# Patient Record
Sex: Female | Born: 1975 | Race: Black or African American | Hispanic: No | Smoking: Current some day smoker
Health system: Southern US, Community
[De-identification: ages and names within clinical notes are randomized; demographics above are authoritative.]

## PROBLEM LIST (undated history)

## (undated) DIAGNOSIS — E119 Type 2 diabetes mellitus without complications: Secondary | ICD-10-CM

## (undated) DIAGNOSIS — L089 Local infection of the skin and subcutaneous tissue, unspecified: Secondary | ICD-10-CM

## (undated) DIAGNOSIS — M86271 Subacute osteomyelitis, right ankle and foot: Secondary | ICD-10-CM

## (undated) DIAGNOSIS — Z8614 Personal history of Methicillin resistant Staphylococcus aureus infection: Secondary | ICD-10-CM

## (undated) DIAGNOSIS — Z794 Long term (current) use of insulin: Secondary | ICD-10-CM

## (undated) HISTORY — PX: IRRIGATION AND DEBRIDEMENT KNEE: SHX5185

## (undated) HISTORY — DX: Subacute osteomyelitis, right ankle and foot: M86.271

---
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
## 2018-10-07 ENCOUNTER — Other Ambulatory Visit: Payer: Self-pay

## 2018-10-07 ENCOUNTER — Encounter (HOSPITAL_COMMUNITY): Payer: Self-pay

## 2018-10-07 ENCOUNTER — Inpatient Hospital Stay (HOSPITAL_COMMUNITY): Payer: Medicaid - Out of State

## 2018-10-07 ENCOUNTER — Inpatient Hospital Stay (HOSPITAL_COMMUNITY)
Admission: AD | Admit: 2018-10-07 | Discharge: 2018-10-15 | DRG: 637 | Disposition: A | Discharge status: Home Health | Payer: Medicaid - Out of State | Source: Other Acute Inpatient Hospital | Attending: Family Medicine | Admitting: Family Medicine

## 2018-10-07 DIAGNOSIS — R7989 Other specified abnormal findings of blood chemistry: Secondary | ICD-10-CM

## 2018-10-07 DIAGNOSIS — Z86718 Personal history of other venous thrombosis and embolism: Secondary | ICD-10-CM | POA: Diagnosis not present

## 2018-10-07 DIAGNOSIS — I10 Essential (primary) hypertension: Secondary | ICD-10-CM | POA: Diagnosis present

## 2018-10-07 DIAGNOSIS — E876 Hypokalemia: Secondary | ICD-10-CM | POA: Diagnosis present

## 2018-10-07 DIAGNOSIS — E1169 Type 2 diabetes mellitus with other specified complication: Principal | ICD-10-CM | POA: Diagnosis present

## 2018-10-07 DIAGNOSIS — M79671 Pain in right foot: Secondary | ICD-10-CM | POA: Diagnosis present

## 2018-10-07 DIAGNOSIS — R7881 Bacteremia: Secondary | ICD-10-CM | POA: Diagnosis present

## 2018-10-07 DIAGNOSIS — E11610 Type 2 diabetes mellitus with diabetic neuropathic arthropathy: Secondary | ICD-10-CM | POA: Diagnosis present

## 2018-10-07 DIAGNOSIS — I82409 Acute embolism and thrombosis of unspecified deep veins of unspecified lower extremity: Secondary | ICD-10-CM

## 2018-10-07 DIAGNOSIS — S81801S Unspecified open wound, right lower leg, sequela: Secondary | ICD-10-CM | POA: Diagnosis not present

## 2018-10-07 DIAGNOSIS — T80211A Bloodstream infection due to central venous catheter, initial encounter: Secondary | ICD-10-CM | POA: Diagnosis not present

## 2018-10-07 DIAGNOSIS — L97509 Non-pressure chronic ulcer of other part of unspecified foot with unspecified severity: Secondary | ICD-10-CM | POA: Diagnosis present

## 2018-10-07 DIAGNOSIS — F17210 Nicotine dependence, cigarettes, uncomplicated: Secondary | ICD-10-CM | POA: Diagnosis present

## 2018-10-07 DIAGNOSIS — E11621 Type 2 diabetes mellitus with foot ulcer: Secondary | ICD-10-CM | POA: Diagnosis present

## 2018-10-07 DIAGNOSIS — E1142 Type 2 diabetes mellitus with diabetic polyneuropathy: Secondary | ICD-10-CM | POA: Diagnosis present

## 2018-10-07 DIAGNOSIS — I38 Endocarditis, valve unspecified: Secondary | ICD-10-CM | POA: Diagnosis present

## 2018-10-07 DIAGNOSIS — T80219A Unspecified infection due to central venous catheter, initial encounter: Secondary | ICD-10-CM | POA: Diagnosis not present

## 2018-10-07 DIAGNOSIS — I340 Nonrheumatic mitral (valve) insufficiency: Secondary | ICD-10-CM | POA: Diagnosis not present

## 2018-10-07 DIAGNOSIS — IMO0002 Reserved for concepts with insufficient information to code with codable children: Secondary | ICD-10-CM

## 2018-10-07 DIAGNOSIS — B9562 Methicillin resistant Staphylococcus aureus infection as the cause of diseases classified elsewhere: Secondary | ICD-10-CM | POA: Diagnosis present

## 2018-10-07 DIAGNOSIS — E43 Unspecified severe protein-calorie malnutrition: Secondary | ICD-10-CM | POA: Diagnosis present

## 2018-10-07 DIAGNOSIS — E1165 Type 2 diabetes mellitus with hyperglycemia: Secondary | ICD-10-CM

## 2018-10-07 DIAGNOSIS — X58XXXS Exposure to other specified factors, sequela: Secondary | ICD-10-CM | POA: Diagnosis not present

## 2018-10-07 DIAGNOSIS — R197 Diarrhea, unspecified: Secondary | ICD-10-CM | POA: Diagnosis not present

## 2018-10-07 DIAGNOSIS — L03115 Cellulitis of right lower limb: Secondary | ICD-10-CM | POA: Diagnosis present

## 2018-10-07 DIAGNOSIS — E118 Type 2 diabetes mellitus with unspecified complications: Secondary | ICD-10-CM

## 2018-10-07 DIAGNOSIS — Z95828 Presence of other vascular implants and grafts: Secondary | ICD-10-CM

## 2018-10-07 DIAGNOSIS — M869 Osteomyelitis, unspecified: Secondary | ICD-10-CM | POA: Diagnosis present

## 2018-10-07 DIAGNOSIS — R945 Abnormal results of liver function studies: Secondary | ICD-10-CM

## 2018-10-07 HISTORY — DX: Long term (current) use of insulin: Z79.4

## 2018-10-07 HISTORY — DX: Personal history of Methicillin resistant Staphylococcus aureus infection: Z86.14

## 2018-10-07 HISTORY — DX: Long term (current) use of insulin: E11.9

## 2018-10-07 LAB — COMPREHENSIVE METABOLIC PANEL
ALT: 44 U/L (ref 0–44)
AST: 55 U/L — ABNORMAL HIGH (ref 15–41)
Albumin: 2.2 g/dL — ABNORMAL LOW (ref 3.5–5.0)
Alkaline Phosphatase: 402 U/L — ABNORMAL HIGH (ref 38–126)
Anion gap: 8 (ref 5–15)
BUN: <5 mg/dL — ABNORMAL LOW (ref 6–20)
CO2: 23 mmol/L (ref 22–32)
Calcium: 8.5 mg/dL — ABNORMAL LOW (ref 8.9–10.3)
Chloride: 104 mmol/L (ref 98–111)
Creatinine, Ser: 0.7 mg/dL (ref 0.44–1.00)
GFR calc Af Amer: >60 mL/min (ref >60)
GFR calc non Af Amer: >60 mL/min (ref >60)
Glucose, Bld: 328 mg/dL — ABNORMAL HIGH (ref 70–99)
Potassium: 3.3 mmol/L — ABNORMAL LOW (ref 3.5–5.1)
Sodium: 135 mmol/L (ref 135–145)
Total Bilirubin: 0.8 mg/dL (ref 0.3–1.2)
Total Protein: 8.9 g/dL — ABNORMAL HIGH (ref 6.5–8.1)

## 2018-10-07 LAB — GLUCOSE, CAPILLARY
GLUCOSE-CAPILLARY: 273 mg/dL — AB (ref 70–99)
Glucose-Capillary: 169 mg/dL — ABNORMAL HIGH (ref 70–99)
Glucose-Capillary: 195 mg/dL — ABNORMAL HIGH (ref 70–99)

## 2018-10-07 LAB — HEMOGLOBIN A1C
Hgb A1c MFr Bld: 11 % — ABNORMAL HIGH (ref 4.8–5.6)
MEAN PLASMA GLUCOSE: 269 mg/dL

## 2018-10-07 LAB — PROTIME-INR
INR: 1 (ref 0.8–1.2)
Prothrombin Time: 12.9 seconds (ref 11.4–15.2)

## 2018-10-07 LAB — LIPID PANEL
Cholesterol: 185 mg/dL (ref 0–200)
HDL: 50 mg/dL (ref >40)
LDL Cholesterol: 122 mg/dL — ABNORMAL HIGH (ref 0–99)
Total CHOL/HDL Ratio: 3.7 RATIO
Triglycerides: 63 mg/dL (ref <150)
VLDL: 13 mg/dL (ref 0–40)

## 2018-10-07 LAB — MAGNESIUM: Magnesium: 1.9 mg/dL (ref 1.7–2.4)

## 2018-10-07 LAB — MRSA PCR SCREENING: MRSA by PCR: POSITIVE — AB

## 2018-10-07 LAB — PREGNANCY, URINE: Preg Test, Ur: NEGATIVE

## 2018-10-07 LAB — HIV ANTIBODY (ROUTINE TESTING W REFLEX): HIV SCREEN 4TH GENERATION: NONREACTIVE

## 2018-10-07 MED ORDER — ACETAMINOPHEN 650 MG RE SUPP: 650.0000 mg | Freq: Four times a day (QID) | RECTAL | Status: DC | PRN

## 2018-10-07 MED ORDER — POTASSIUM CHLORIDE 10 MEQ/100ML IV SOLN
10.0000 meq | INTRAVENOUS | Status: DC
  Filled 2018-10-07 (×3): qty 100

## 2018-10-07 MED ORDER — POTASSIUM CHLORIDE 10 MEQ/100ML IV SOLN
10.0000 meq | INTRAVENOUS | Status: AC
  Administered 2018-10-07 (×2): 10 meq via INTRAVENOUS
  Filled 2018-10-07 (×2): qty 100

## 2018-10-07 MED ORDER — PIPERACILLIN-TAZOBACTAM 3.375 G IVPB
3.3750 g | Freq: Three times a day (TID) | INTRAVENOUS | Status: DC
  Administered 2018-10-07 – 2018-10-08 (×4): 3.375 g via INTRAVENOUS
  Filled 2018-10-07 (×5): qty 50

## 2018-10-07 MED ORDER — BISACODYL 5 MG PO TBEC: 5.0000 mg | DELAYED_RELEASE_TABLET | Freq: Every day | ORAL | Status: DC | PRN

## 2018-10-07 MED ORDER — INSULIN ASPART 100 UNIT/ML ~~LOC~~ SOLN
0.0000 [IU] | SUBCUTANEOUS | Status: DC
  Administered 2018-10-07: 7 [IU] via SUBCUTANEOUS
  Administered 2018-10-07: 2 [IU] via SUBCUTANEOUS

## 2018-10-07 MED ORDER — RIVAROXABAN 20 MG PO TABS: 20.0000 mg | ORAL_TABLET | Freq: Every day | ORAL | Status: DC

## 2018-10-07 MED ORDER — INSULIN GLARGINE 100 UNIT/ML ~~LOC~~ SOLN
40.0000 [IU] | Freq: Two times a day (BID) | SUBCUTANEOUS | Status: DC
  Administered 2018-10-07 – 2018-10-15 (×16): 40 [IU] via SUBCUTANEOUS
  Filled 2018-10-07 (×19): qty 0.4

## 2018-10-07 MED ORDER — ZINC SULFATE 220 (50 ZN) MG PO CAPS
220.0000 mg | ORAL_CAPSULE | Freq: Every day | ORAL | Status: DC
  Administered 2018-10-07 – 2018-10-15 (×9): 220 mg via ORAL
  Filled 2018-10-07 (×9): qty 1

## 2018-10-07 MED ORDER — ZOLPIDEM TARTRATE 5 MG PO TABS
5.0000 mg | ORAL_TABLET | Freq: Every evening | ORAL | Status: DC | PRN
  Administered 2018-10-08: 5 mg via ORAL
  Filled 2018-10-07: qty 1

## 2018-10-07 MED ORDER — TRAMADOL HCL 50 MG PO TABS
50.0000 mg | ORAL_TABLET | Freq: Four times a day (QID) | ORAL | Status: DC | PRN
  Filled 2018-10-07: qty 1

## 2018-10-07 MED ORDER — VANCOMYCIN HCL 1000 MG IV SOLR
1000.0000 mg | Freq: Three times a day (TID) | INTRAVENOUS | Status: DC
  Administered 2018-10-07 – 2018-10-08 (×3): 1000 mg via INTRAVENOUS
  Filled 2018-10-07 (×4): qty 1000

## 2018-10-07 MED ORDER — ACETAMINOPHEN 325 MG PO TABS
650.0000 mg | ORAL_TABLET | Freq: Four times a day (QID) | ORAL | Status: DC | PRN
  Administered 2018-10-07: 650 mg via ORAL
  Filled 2018-10-07 (×2): qty 2

## 2018-10-07 MED ORDER — DIPHENHYDRAMINE HCL 25 MG PO CAPS
25.0000 mg | ORAL_CAPSULE | Freq: Four times a day (QID) | ORAL | Status: AC | PRN
  Administered 2018-10-08 – 2018-10-10 (×2): 25 mg via ORAL
  Filled 2018-10-07 (×5): qty 1

## 2018-10-07 MED ORDER — SODIUM CHLORIDE 0.9 % IV SOLN
INTRAVENOUS | Status: DC
  Administered 2018-10-07: 06:00:00 via INTRAVENOUS

## 2018-10-07 MED ORDER — BASAGLAR KWIKPEN 100 UNIT/ML ~~LOC~~ SOPN: 50.0000 [IU] | PEN_INJECTOR | Freq: Two times a day (BID) | SUBCUTANEOUS | Status: DC

## 2018-10-07 MED ORDER — PRO-STAT SUGAR FREE PO LIQD
30.0000 mL | Freq: Two times a day (BID) | ORAL | Status: DC
  Administered 2018-10-07 – 2018-10-15 (×17): 30 mL via ORAL
  Filled 2018-10-07 (×17): qty 30

## 2018-10-07 MED ORDER — CHLORHEXIDINE GLUCONATE CLOTH 2 % EX PADS
6.0000 | MEDICATED_PAD | Freq: Every day | CUTANEOUS | Status: AC
  Administered 2018-10-09 – 2018-10-12 (×3): 6 via TOPICAL

## 2018-10-07 MED ORDER — MUPIROCIN 2 % EX OINT
1.0000 "application " | TOPICAL_OINTMENT | Freq: Two times a day (BID) | CUTANEOUS | Status: AC
  Administered 2018-10-07 – 2018-10-12 (×10): 1 via NASAL
  Filled 2018-10-07 (×2): qty 22

## 2018-10-07 MED ORDER — INSULIN ASPART 100 UNIT/ML ~~LOC~~ SOLN
0.0000 [IU] | Freq: Every day | SUBCUTANEOUS | Status: DC
  Administered 2018-10-08 – 2018-10-10 (×2): 3 [IU] via SUBCUTANEOUS
  Administered 2018-10-11 – 2018-10-13 (×3): 2 [IU] via SUBCUTANEOUS

## 2018-10-07 MED ORDER — SENNOSIDES-DOCUSATE SODIUM 8.6-50 MG PO TABS: 1.0000 | ORAL_TABLET | Freq: Every evening | ORAL | Status: DC | PRN

## 2018-10-07 MED ORDER — POTASSIUM CHLORIDE 10 MEQ/100ML IV SOLN
10.0000 meq | INTRAVENOUS | Status: AC
  Administered 2018-10-07: 10 meq via INTRAVENOUS
  Filled 2018-10-07: qty 100

## 2018-10-07 MED ORDER — VANCOMYCIN HCL 10 G IV SOLR
1500.0000 mg | Freq: Once | INTRAVENOUS | Status: AC
  Administered 2018-10-07: 1500 mg via INTRAVENOUS
  Filled 2018-10-07: qty 1500

## 2018-10-07 MED ORDER — HEPARIN SODIUM (PORCINE) 5000 UNIT/ML IJ SOLN
5000.0000 [IU] | Freq: Three times a day (TID) | INTRAMUSCULAR | Status: DC
  Administered 2018-10-07 – 2018-10-15 (×25): 5000 [IU] via SUBCUTANEOUS
  Filled 2018-10-07 (×22): qty 1

## 2018-10-07 MED ORDER — INSULIN ASPART 100 UNIT/ML ~~LOC~~ SOLN
0.0000 [IU] | Freq: Three times a day (TID) | SUBCUTANEOUS | Status: DC
  Administered 2018-10-07: 2 [IU] via SUBCUTANEOUS
  Administered 2018-10-07: 5 [IU] via SUBCUTANEOUS
  Administered 2018-10-08: 3 [IU] via SUBCUTANEOUS
  Administered 2018-10-08: 2 [IU] via SUBCUTANEOUS
  Administered 2018-10-08: 3 [IU] via SUBCUTANEOUS
  Administered 2018-10-09 – 2018-10-10 (×3): 2 [IU] via SUBCUTANEOUS
  Administered 2018-10-11: 3 [IU] via SUBCUTANEOUS
  Administered 2018-10-11: 09:00:00 1 [IU] via SUBCUTANEOUS
  Administered 2018-10-11: 3 [IU] via SUBCUTANEOUS
  Administered 2018-10-12: 2 [IU] via SUBCUTANEOUS
  Administered 2018-10-12: 4 [IU] via SUBCUTANEOUS
  Administered 2018-10-13: 5 [IU] via SUBCUTANEOUS
  Administered 2018-10-13: 3 [IU] via SUBCUTANEOUS
  Administered 2018-10-13: 7 [IU] via SUBCUTANEOUS
  Administered 2018-10-14 – 2018-10-15 (×3): 3 [IU] via SUBCUTANEOUS
  Administered 2018-10-15: 1 [IU] via SUBCUTANEOUS

## 2018-10-07 MED ORDER — KETOROLAC TROMETHAMINE 30 MG/ML IJ SOLN: 30.0000 mg | Freq: Four times a day (QID) | INTRAMUSCULAR | Status: AC | PRN

## 2018-10-07 NOTE — Progress Notes (Signed)
Orthopedic Tech Progress Note Patient Details:  Yolanda [name] [birth year] [id number]  Ortho Devices Type of Ortho Device: CAM walker Ortho Device/Splint Location: LRE Ortho Device/Splint Interventions: Adjustment, Application, Ordered   Post Interventions Patient Tolerated: Well Instructions Provided: Care of device, Adjustment of device   Donald Pore 10/07/2018, 10:39 AM

## 2018-10-07 NOTE — H&P (Addendum)
[facility] History and Physical  Disney Goguen OIB:[id number] DOB: [birth year] DOA: 10/07/2018  Referring physician:  PCP: No primary care provider on file.   Chief Complaint: Diabetic foot ulcer with osteomyelitis  HPI: Yolanda [name] is a 43 y.o.  BF PMHx Uncontrolled Diabetes type 2 with complication, diabetic foot ulcer ,OM and Celllulitis with MRSA Bacteremia as well , 4 weeks ago, on Daptomycin . Was followed by surgery at [facility] who contacted Dr Lajoyce Corners from orthopedic surgery who agreed to see patient in consult . Pt accepted to Medsurg at [facility] . Pt had an MRI at [facility] which showed OM .     Review of Systems:  Constitutional:  No weight loss, night sweats, Fevers, chills, fatigue.  HEENT:  No headaches, Difficulty swallowing,Tooth/dental problems,Sore throat,  No sneezing, itching, ear ache, nasal congestion, post nasal drip,  Cardio-vascular:  No chest pain, Orthopnea, PND, swelling in lower extremities, anasarca, dizziness, palpitations  GI:  No heartburn, indigestion, abdominal pain, nausea, vomiting, diarrhea, change in bowel habits, loss of appetite  Resp:  No shortness of breath with exertion or at rest. No excess mucus, no productive cough, No non-productive cough, No coughing up of blood.No change in color of mucus.No wheezing.No chest wall deformity  Skin:  RIGHT lower extremity ulcer/wound wrapped GU:  no dysuria, change in color of urine, no urgency or frequency. No flank pain.  Musculoskeletal:  Positive RIGHT lower extremity pain and swelling.  No decreased range of motion. No back pain.  Psych:  No change in mood or affect. No depression or anxiety. No memory loss.   Social History:  has no history on file for tobacco, alcohol, and drug.  No Known Allergies  No family history on file.  Negative family history HTN, HLD, cancer    Prior to Admission medications   Not on File     Consultants:  Dr. Lajoyce Corners orthopedic  surgery    Procedures/Significant Events:  None    I have personally reviewed and interpreted all radiology studies and my findings are as above.   VENTILATOR SETTINGS:    Cultures 3/29 blood pending    Antimicrobials: 3/29 Zosyn per pharmacy 3/29 vancomycin per pharmacy    Devices    LINES / TUBES:   Left PICC Line Placed outside Facility>>>   Continuous Infusions:  Physical Exam: Vitals:   10/07/18 0141  BP: (!) 148/96  Pulse: (!) 105  Resp: 16  Temp: 98.3 F (36.8 C)  TempSrc: Oral  SpO2: 98%    Wt Readings from Last 3 Encounters:  No data found for Wt    General: A/O x4, no acute respiratory distress Eyes: negative scleral hemorrhage, negative anisocoria, negative icterus ENT: Negative Runny nose, negative gingival bleeding, Neck:  Negative scars, masses, torticollis, lymphadenopathy, JVD Lungs: Clear to auscultation bilaterally without wheezes or crackles Cardiovascular: Regular rate and rhythm without murmur gallop or rub normal S1 and S2 Abdomen: negative abdominal pain, nondistended, positive soft, bowel sounds, no rebound, no ascites, no appreciable mass Extremities: No significant cyanosis, clubbing, or edema bilateral lower extremities Skin: Right lower extremity ulcer covered and clean did not take down dressing Psychiatric:  Negative depression, negative anxiety, negative fatigue, negative mania  Central nervous system:  Cranial nerves II through XII intact, tongue/uvula midline, all extremities muscle strength 5/5, sensation intact throughout, negative dysarthria, negative expressive aphasia, negative receptive aphasia.        Labs on Admission:  Basic Metabolic Panel: No results for input(s): NA, K, CL, CO2,  GLUCOSE, BUN, CREATININE, CALCIUM, MG, PHOS in the last 168 hours. Liver Function Tests: No results for input(s): AST, ALT, ALKPHOS, BILITOT, PROT, ALBUMIN in the last 168 hours. No results for input(s): LIPASE, AMYLASE in the  last 168 hours. No results for input(s): AMMONIA in the last 168 hours. CBC: No results for input(s): WBC, NEUTROABS, HGB, HCT, MCV, PLT in the last 168 hours. Cardiac Enzymes: No results for input(s): CKTOTAL, CKMB, CKMBINDEX, TROPONINI in the last 168 hours.  BNP (last 3 results) No results for input(s): BNP in the last 8760 hours.  ProBNP (last 3 results) No results for input(s): PROBNP in the last 8760 hours.  CBG: No results for input(s): GLUCAP in the last 168 hours.  Radiological Exams on Admission: No results found.  EKG: Independently reviewed. pending  Assessment/Plan Active Problems:   Osteomyelitis of ankle and foot (HCC)   Diabetes mellitus type 2, uncontrolled, with complications (HCC)   MRSA bacteremia   Osteomyelitis RIGHT foot/ankle - Antibiotics per pharmacy - Dr. Lajoyce Corners excepted patient in consult.  Will await recommendations when patient is seen in the a.m.  MRSA bacteremia - Current antibiotics should cover - Blood cultures pending  Diabetes type 2 uncontrolled with complication - Hemoglobin A1c pending -Lipid panel pending - Sensitive SSI     Code Status: Full (DVT Prophylaxis: Held pending surgery Family Communication: None Disposition Plan: Per surgery   Data Reviewed: Care during the described time interval was provided by me .  I have reviewed this patient's available data, including medical history, events of note, physical examination, and all test results as part of my evaluation.   Time spent: 60 min  Isa Kohlenberg, Roselind Messier [facility] Pager [phone]

## 2018-10-07 NOTE — Progress Notes (Signed)
Pharmacy Antibiotic Note  Yolanda [name] is a 43 y.o. female admitted on 10/07/2018 with worsening right ankle cellulitis/osteomyelitis.  SCr 0.78 at [facility].  On Daptomycin 600 mg q24h as outpatient and at [facility].   Pharmacy has been consulted for Vancomycin and Zosyn  dosing.    Plan: Vancomycin 1500 mg IV now, then 1 g IV  q8h Zosyn 3.375 g IV q8h   Height: 5\' 6"  (167.6 cm) Weight: 170 lb (77.1 kg) IBW/kg (Calculated) : 59.3  Temp (24hrs), Avg:98.3 F (36.8 C), Min:98.3 F (36.8 C), Max:98.3 F (36.8 C)  No results for input(s): WBC, CREATININE, LATICACIDVEN, VANCOTROUGH, VANCOPEAK, VANCORANDOM, GENTTROUGH, GENTPEAK, GENTRANDOM, TOBRATROUGH, TOBRAPEAK, TOBRARND, AMIKACINPEAK, AMIKACINTROU, AMIKACIN in the last 168 hours.  CrCl cannot be calculated (No successful lab value found.).    No Known Allergies   Eddie Candle 10/07/2018 3:49 AM

## 2018-10-07 NOTE — Plan of Care (Signed)
  Problem: Education: Goal: Ability to describe self-care measures that may prevent or decrease complications (Diabetes Survival Skills Education) will improve Outcome: Progressing Goal: Individualized Educational Video(s) Outcome: Progressing   

## 2018-10-07 NOTE — Consult Note (Signed)
ORTHOPAEDIC CONSULTATION  REQUESTING PHYSICIAN: Cathren Harsh, MD  Chief Complaint: Mechanical pain right foot possible osteomyelitis.  HPI: Yolanda [name] is a 43 y.o. female with uncontrolled diabetic insensate neuropathy who presents with a chronic wound over the lateral aspect of the right leg.  Patient has been undergoing wound care and treatment for several months.  Patient states the wound was initially culture positive for MRSA.  Patient has been undergoing wound VAC treatment changes 3 times a week at the hospital in [place].  She is currently on IV antibiotics with a PICC line.  Past Medical History:  Diagnosis Date  . MRSA infection within last 3 months   . Type 2 diabetes mellitus treated with insulin [facility])    Past Surgical History:  Procedure Laterality Date  . IRRIGATION AND DEBRIDEMENT KNEE Left    Social History   Socioeconomic History  . Marital status: Unknown    Spouse name: Not on file  . Number of children: Not on file  . Years of education: Not on file  . Highest education level: Not on file  Occupational History  . Not on file  Social Needs  . Financial resource strain: Not on file  . Food insecurity:    Worry: Not on file    Inability: Not on file  . Transportation needs:    Medical: Not on file    Non-medical: Not on file  Tobacco Use  . Smoking status: Current Some Day Smoker    Years: 3.00    Types: Cigarettes  . Smokeless tobacco: Never Used  Substance and Sexual Activity  . Alcohol use: Not Currently  . Drug use: Never  . Sexual activity: Not on file  Lifestyle  . Physical activity:    Days per week: Not on file    Minutes per session: Not on file  . Stress: Not on file  Relationships  . Social connections:    Talks on phone: Not on file    Gets together: Not on file    Attends religious service: Not on file    Active member of club or organization: Not on file    Attends meetings of clubs or organizations: Not on file   Relationship status: Not on file  Other Topics Concern  . Not on file  Social History Narrative  . Not on file   No family history on file. - negative except otherwise stated in the family history section No Known Allergies Prior to Admission medications   Not on File   Dg Chest 1 View  Result Date: 10/07/2018 CLINICAL DATA:  MRSA bacteremia, PICC line placement EXAM: CHEST  1 VIEW COMPARISON:  None. FINDINGS: Lungs are clear.  No pleural effusion or pneumothorax. The heart is normal in size. Left arm PICC terminates in the upper SVC, 4.5 cm above the cavoatrial junction. IMPRESSION: Left arm PICC terminates in the upper SVC, 4.5 cm above the cavoatrial junction. Electronically Signed   By: Charline Bills M.D.   On: 10/07/2018 06:05   Dg Humerus Left  Result Date: 10/07/2018 CLINICAL DATA:  MRSA bacteremia, status post PICC line placement EXAM: LEFT HUMERUS - 2+ VIEW COMPARISON:  None. FINDINGS: No fracture or dislocation is seen. The joint spaces are preserved. The visualized soft tissues are unremarkable. Visualized left lung is clear. Left arm PICC courses through the axillary region into the chest. IMPRESSION: Negative. Left arm PICC courses through the axillary region into the chest. Electronically Signed   By: Lurlean Horns  Rito Ehrlich M.D.   On: 10/07/2018 06:04   - pertinent xrays, CT, MRI studies were reviewed and independently interpreted  Positive ROS: All other systems have been reviewed and were otherwise negative with the exception of those mentioned in the HPI and as above.  Physical Exam: General: Alert, no acute distress Psychiatric: Patient is competent for consent with normal mood and affect Lymphatic: No axillary or cervical lymphadenopathy Cardiovascular: No pedal edema Respiratory: No cyanosis, no use of accessory musculature GI: No organomegaly, abdomen is soft and non-tender    Images:  @ENCIMAGES @  Labs:  Lab Results  Component Value Date   HGBA1C 11.0  (H) 10/07/2018    Lab Results  Component Value Date   ALBUMIN 2.2 (L) 10/07/2018    Neurologic: Patient does not have protective sensation bilateral lower extremities.   MUSCULOSKELETAL:   Skin: Examination patient does have swelling of the right foot.  She has a strong dorsalis pedis and posterior tibial pulse no arterial insufficiency.  There are no foot ulcers there is no cellulitis.  The wound VAC was removed and patient has a large wound over the fibula which is 3 cm at the widest diameter and about 20 cm in length.  The wound has excellent granulation tissue there is no purulence no exposed bone or tendon no cellulitis.  Review of the MRI scan on the disc shows no evidence of osteomyelitis of the fibula beneath the wound in the area that is visible on the MRI scan.  Examination of the hindfoot and the MRI scan shows edema in the talus calcaneus navicular cuboid and cuneiforms.  There is no signs of an abscess.  There is a small ankle effusion.  Radiographs show destructive changes of the hindfoot.  This all has the appearance of Charcot arthropathy.  Review of patient's labs she has a hemoglobin A1c of 11 and an albumin of 2.2 consistent with uncontrolled diabetes and severe protein caloric malnutrition.  Assessment: Assessment: Uncontrolled type 2 diabetes with severe protein caloric malnutrition with a Charcot collapse of the right hindfoot with a chronic wound over the fibula with excellent granulation tissue right lower extremity  Plan: Plan: 4 to 6 weeks of IV antibiotics should be sufficient to treat her MRSA bacteremia.  Patient does not need surgical intervention for the Charcot arthropathy this will take 6 months to completely heal.  The wound VAC has provided an excellent granulation bed and patient can be transitioned to my medical compression stockings.  I will bring these by on Monday.  I can follow-up as an outpatient if patient can travel.  Thank you for the consult  and the opportunity to see Ms. Yolanda Bookbinder, MD [facility] [phone] 9:41 AM

## 2018-10-07 NOTE — Progress Notes (Signed)
RLE venous duplex       has been completed. Preliminary results can be found under CV proc through chart review. Amaka Gluth, BS, RDMS, RVT   

## 2018-10-07 NOTE — Progress Notes (Addendum)
[facility]                                                                              Patient Demographics  Yolanda [name], is a 43 y.o. female, DOB - 1976/07/06, NWG:[id number]  Admit date - 10/07/2018   Admitting Physician Drema Dallas, MD  Outpatient Primary MD for the patient is No primary care provider on file.  Outpatient specialists:   LOS - 0  days   Medical records reviewed and are as summarized below:    No chief complaint on file.      Brief summary   43 year old female with uncontrolled diabetes mellitus type 2, diabetic foot ulcer, osteomyelitis and cellulitis with MRSA bacteremia, 4 weeks ongoing on daptomycin.  Patient was followed by surgery at [facility] who contacted Dr. Lajoyce Corners from orthopedic surgery for further management.  Patient was accepted to [facility].  Patient had an MRI at [facility] which showed osteomyelitis. Patient has a left arm PICC line placed outside facility  Assessment & Plan    Principal Problem:   Osteomyelitis of right ankle and foot (HCC) -MRI of the RLE at outside facility showed osteomyelitis, unable to find the MRI report in the care everywhere.  Patient has wound VAC on the right lower extremity.  Patient was transferred to [facility] for further management after consulting orthopedics, Dr. Lajoyce Corners -Discussed with Dr. Lajoyce Corners, who reviewed radiographs from [facility].  No need for surgery at this time for Charcot's arthropathy, will take 6 months to heal, will place medical compression stockings in a.m. -She had been receiving wound VAC treatment changes 3 times a week in [place].   Active Problems: MRSA bacteremia -Repeat blood cultures -Patient was on daptomycin at the outside facility, PICC line in left arm -Appreciate pharmacy for looking into PTA meds and records, patient started daptomycin on 09/14/2018 supposed to finish on 10/19/2018, 6 weeks - will discharge on daptomycin to complete on  10/19/2018    Diabetes mellitus type 2, uncontrolled, with complications (HCC) -Patient is on Lantus 50 units twice daily, pt states she has had hypoglycemia issues  -Placed on sliding scale insulin 3 times daily AC, decrease Lantus to 40 units twice a day, will adjust further according to CBG readings -Hemoglobin A1c 11.0  Hypokalemia Replaced  Right lower extremity DVT, diagnosed in 07/2018 -Patient was on Xarelto, completed starter pack however Xarelto was held due to bleeding issues at [facility] with debridement. -Recheck lower right lower extremity Doppler today, if still has DVT, will restart Xarelto and assess for any bleeding issues   Severe protein calorie malnutrition -Albumin 2.2 -Placed on pro-stat, zinc daily   Code Status: Full code DVT Prophylaxis: For now continue heparin subcu Family Communication: Discussed in detail with the patient, all imaging results, lab results explained to the patient    Disposition Plan: Per patient she was at [facility], however before that she was at home and had been receiving IV antibiotics via PICC line Await PT evaluation  Time Spent in minutes 35 minutes  Procedures:  None  Consultants:   Orthopedics  Antimicrobials:   Anti-infectives (From admission, onward)  Start     Dose/Rate Route Frequency Ordered Stop   10/07/18 1400  vancomycin (VANCOCIN) 1,000 mg in sodium chloride 0.9 % 250 mL IVPB     1,000 mg 250 mL/hr over 60 Minutes Intravenous Every 8 hours 10/07/18 0351     10/07/18 0430  vancomycin (VANCOCIN) 1,500 mg in sodium chloride 0.9 % 500 mL IVPB     1,500 mg 250 mL/hr over 120 Minutes Intravenous  Once 10/07/18 0351 10/07/18 0835   10/07/18 0430  piperacillin-tazobactam (ZOSYN) IVPB 3.375 g     3.375 g 12.5 mL/hr over 240 Minutes Intravenous Every 8 hours 10/07/18 0351           Medications  Scheduled Meds: . insulin aspart  0-9 Units Subcutaneous Q4H   Continuous Infusions: .  sodium chloride 75 mL/hr at 10/07/18 0629  . piperacillin-tazobactam (ZOSYN)  IV 3.375 g (10/07/18 0639)  . potassium chloride    . vancomycin     PRN Meds:.acetaminophen **OR** acetaminophen, bisacodyl, ketorolac, senna-docusate, traMADol, zolpidem      Subjective:   Yolanda [name] was seen and examined today.  Pain controlled.  No fevers or chills.  Patient denies dizziness, chest pain, shortness of breath, abdominal pain, N/V/D/C, new weakness, numbess, tingling. No acute events overnight.    Objective:   Vitals:   10/07/18 0141 10/07/18 0433  BP: (!) 148/96 (!) 143/99  Pulse: (!) 105 (!) 106  Resp: 16 16  Temp: 98.3 F (36.8 C) 98.6 F (37 C)  TempSrc: Oral Oral  SpO2: 98% 96%  Weight: 77.1 kg   Height: 5\' 6"  (1.676 m)    No intake or output data in the 24 hours ending 10/07/18 0856   Wt Readings from Last 3 Encounters:  10/07/18 77.1 kg     Exam  General: Alert and oriented x 3, NAD  Eyes:   HEENT:  Atraumatic, normocephalic  Cardiovascular: S1 S2 auscultated, Regular rate and rhythm.  Respiratory: Clear to auscultation bilaterally, no wheezing, rales or rhonchi  Gastrointestinal: Soft, nontender, nondistended, + bowel sounds  Ext: no pedal edema bilaterally, right lower extremity dressing intact with wound VAC  Neuro: No new deficits  Musculoskeletal: No digital cyanosis, clubbing  Skin: Right lower extremity dressing intact with wound VAC  Psych: Normal affect and demeanor, alert and oriented x3    Data Reviewed:  I have personally reviewed following labs and imaging studies  Micro Results Recent Results (from the past 240 hour(s))  MRSA PCR Screening     Status: Abnormal   Collection Time: 10/07/18  5:34 AM  Result Value Ref Range Status   MRSA by PCR POSITIVE (A) NEGATIVE Final    Comment:        The GeneXpert MRSA Assay (FDA approved for NASAL specimens only), is one component of a comprehensive MRSA colonization surveillance  program. It is not intended to diagnose MRSA infection nor to guide or monitor treatment for MRSA infections. RESULT CALLED TO, READ BACK BY AND VERIFIED WITH: B. DAVIS RN, AT [phone] 10/07/18 BY Renato Shin Performed at [facility], [address] [place], [place] [address]     Radiology Reports Dg Chest 1 View  Result Date: 10/07/2018 CLINICAL DATA:  MRSA bacteremia, PICC line placement EXAM: CHEST  1 VIEW COMPARISON:  None. FINDINGS: Lungs are clear.  No pleural effusion or pneumothorax. The heart is normal in size. Left arm PICC terminates in the upper SVC, 4.5 cm above the cavoatrial junction. IMPRESSION: Left arm PICC terminates  in the upper SVC, 4.5 cm above the cavoatrial junction. Electronically Signed   By: Charline Bills M.D.   On: 10/07/2018 06:05   Dg Humerus Left  Result Date: 10/07/2018 CLINICAL DATA:  MRSA bacteremia, status post PICC line placement EXAM: LEFT HUMERUS - 2+ VIEW COMPARISON:  None. FINDINGS: No fracture or dislocation is seen. The joint spaces are preserved. The visualized soft tissues are unremarkable. Visualized left lung is clear. Left arm PICC courses through the axillary region into the chest. IMPRESSION: Negative. Left arm PICC courses through the axillary region into the chest. Electronically Signed   By: Charline Bills M.D.   On: 10/07/2018 06:04    Lab Data:  CBC: No results for input(s): WBC, NEUTROABS, HGB, HCT, MCV, PLT in the last 168 hours. Basic Metabolic Panel: Recent Labs  Lab 10/07/18 0329  NA 135  K 3.3*  CL 104  CO2 23  GLUCOSE 328*  BUN <5*  CREATININE 0.70  CALCIUM 8.5*  MG 1.9   GFR: Estimated Creatinine Clearance: 96 mL/min (by C-G formula based on SCr of 0.7 mg/dL). Liver Function Tests: Recent Labs  Lab 10/07/18 0329  AST 55*  ALT 44  ALKPHOS 402*  BILITOT 0.8  PROT 8.9*  ALBUMIN 2.2*   No results for input(s): LIPASE, AMYLASE in the last 168 hours. No results for input(s): AMMONIA in the last 168  hours. Coagulation Profile: Recent Labs  Lab 10/07/18 0329  INR 1.0   Cardiac Enzymes: No results for input(s): CKTOTAL, CKMB, CKMBINDEX, TROPONINI in the last 168 hours. BNP (last 3 results) No results for input(s): PROBNP in the last 8760 hours. HbA1C: Recent Labs    10/07/18 0329  HGBA1C 11.0*   CBG: No results for input(s): GLUCAP in the last 168 hours. Lipid Profile: Recent Labs    10/07/18 0329  CHOL 185  HDL 50  LDLCALC 122*  TRIG 63  CHOLHDL 3.7   Thyroid Function Tests: No results for input(s): TSH, T4TOTAL, FREET4, T3FREE, THYROIDAB in the last 72 hours. Anemia Panel: No results for input(s): VITAMINB12, FOLATE, FERRITIN, TIBC, IRON, RETICCTPCT in the last 72 hours. Urine analysis: No results found for: COLORURINE, APPEARANCEUR, LABSPEC, PHURINE, GLUCOSEU, HGBUR, BILIRUBINUR, KETONESUR, PROTEINUR, UROBILINOGEN, NITRITE, LEUKOCYTESUR   Malyk Girouard M.D. [facility] 10/07/2018, 8:56 AM  Pager: [phone] Between 7am to 7pm - call Pager - [phone]  After 7pm go to [email or web address] - password [email or web address]  Call night coverage person covering after 7pm

## 2018-10-08 ENCOUNTER — Inpatient Hospital Stay (HOSPITAL_COMMUNITY): Payer: Medicaid - Out of State

## 2018-10-08 DIAGNOSIS — I340 Nonrheumatic mitral (valve) insufficiency: Secondary | ICD-10-CM

## 2018-10-08 DIAGNOSIS — M869 Osteomyelitis, unspecified: Secondary | ICD-10-CM

## 2018-10-08 DIAGNOSIS — F17210 Nicotine dependence, cigarettes, uncomplicated: Secondary | ICD-10-CM | POA: Diagnosis present

## 2018-10-08 DIAGNOSIS — A4902 Methicillin resistant Staphylococcus aureus infection, unspecified site: Secondary | ICD-10-CM

## 2018-10-08 DIAGNOSIS — Z95828 Presence of other vascular implants and grafts: Secondary | ICD-10-CM

## 2018-10-08 LAB — BLOOD CULTURE ID PANEL (REFLEXED)
Acinetobacter baumannii: NOT DETECTED
Candida albicans: NOT DETECTED
Candida glabrata: NOT DETECTED
Candida krusei: NOT DETECTED
Candida parapsilosis: NOT DETECTED
Candida tropicalis: NOT DETECTED
ENTEROBACTER CLOACAE COMPLEX: NOT DETECTED
Enterobacteriaceae species: NOT DETECTED
Enterococcus species: NOT DETECTED
Escherichia coli: NOT DETECTED
Haemophilus influenzae: NOT DETECTED
KLEBSIELLA OXYTOCA: NOT DETECTED
Klebsiella pneumoniae: NOT DETECTED
Listeria monocytogenes: NOT DETECTED
Methicillin resistance: DETECTED — AB
Neisseria meningitidis: NOT DETECTED
Proteus species: NOT DETECTED
Pseudomonas aeruginosa: NOT DETECTED
STREPTOCOCCUS AGALACTIAE: NOT DETECTED
STREPTOCOCCUS PYOGENES: NOT DETECTED
Serratia marcescens: NOT DETECTED
Staphylococcus aureus (BCID): DETECTED — AB
Staphylococcus species: DETECTED — AB
Streptococcus pneumoniae: NOT DETECTED
Streptococcus species: NOT DETECTED

## 2018-10-08 LAB — ECHOCARDIOGRAM LIMITED
Height: 66 in
Weight: 2720 oz

## 2018-10-08 LAB — BASIC METABOLIC PANEL
Anion gap: 7 (ref 5–15)
BUN: 5 mg/dL — AB (ref 6–20)
CO2: 25 mmol/L (ref 22–32)
Calcium: 8.6 mg/dL — ABNORMAL LOW (ref 8.9–10.3)
Chloride: 105 mmol/L (ref 98–111)
Creatinine, Ser: 0.63 mg/dL (ref 0.44–1.00)
GFR calc Af Amer: >60 mL/min (ref >60)
GFR calc non Af Amer: >60 mL/min (ref >60)
Glucose, Bld: 174 mg/dL — ABNORMAL HIGH (ref 70–99)
Potassium: 3.4 mmol/L — ABNORMAL LOW (ref 3.5–5.1)
Sodium: 137 mmol/L (ref 135–145)

## 2018-10-08 LAB — CBC
HCT: 31.7 % — ABNORMAL LOW (ref 36.0–46.0)
Hemoglobin: 10.1 g/dL — ABNORMAL LOW (ref 12.0–15.0)
MCH: 30.2 pg (ref 26.0–34.0)
MCHC: 31.9 g/dL (ref 30.0–36.0)
MCV: 94.9 fL (ref 80.0–100.0)
Platelets: 205 10*3/uL (ref 150–400)
RBC: 3.34 MIL/uL — ABNORMAL LOW (ref 3.87–5.11)
RDW: 12.7 % (ref 11.5–15.5)
WBC: 5.5 10*3/uL (ref 4.0–10.5)
nRBC: 0 % (ref 0.0–0.2)

## 2018-10-08 LAB — GLUCOSE, CAPILLARY
Glucose-Capillary: 178 mg/dL — ABNORMAL HIGH (ref 70–99)
Glucose-Capillary: 154 mg/dL — ABNORMAL HIGH (ref 70–99)
Glucose-Capillary: 215 mg/dL — ABNORMAL HIGH (ref 70–99)
Glucose-Capillary: 250 mg/dL — ABNORMAL HIGH (ref 70–99)
Glucose-Capillary: 296 mg/dL — ABNORMAL HIGH (ref 70–99)

## 2018-10-08 LAB — URINE CULTURE: Culture: <10000 — AB

## 2018-10-08 MED ORDER — INSULIN ASPART 100 UNIT/ML ~~LOC~~ SOLN
3.0000 [IU] | Freq: Three times a day (TID) | SUBCUTANEOUS | Status: DC
  Administered 2018-10-08 – 2018-10-11 (×8): 3 [IU] via SUBCUTANEOUS

## 2018-10-08 MED ORDER — TRAMADOL HCL 50 MG PO TABS: 50.0000 mg | ORAL_TABLET | Freq: Three times a day (TID) | ORAL | 0 refills | Status: DC | PRN

## 2018-10-08 MED ORDER — VITAMIN C 500 MG PO TABS: 500.0000 mg | ORAL_TABLET | Freq: Every day | ORAL | 2 refills | Status: AC

## 2018-10-08 MED ORDER — DAPTOMYCIN IV (FOR PTA / DISCHARGE USE ONLY): 600.0000 mg | INTRAVENOUS | 0 refills | Status: DC

## 2018-10-08 MED ORDER — METOPROLOL TARTRATE 12.5 MG HALF TABLET
12.5000 mg | ORAL_TABLET | Freq: Two times a day (BID) | ORAL | Status: DC
  Administered 2018-10-08 – 2018-10-09 (×3): 12.5 mg via ORAL
  Filled 2018-10-08 (×3): qty 1

## 2018-10-08 MED ORDER — LABETALOL HCL 5 MG/ML IV SOLN
10.0000 mg | INTRAVENOUS | Status: DC | PRN
  Administered 2018-10-08: 10 mg via INTRAVENOUS
  Filled 2018-10-08 (×2): qty 4

## 2018-10-08 MED ORDER — VANCOMYCIN HCL 1000 MG IV SOLR
1000.0000 mg | Freq: Two times a day (BID) | INTRAVENOUS | Status: DC
  Administered 2018-10-08 – 2018-10-09 (×2): 1000 mg via INTRAVENOUS
  Filled 2018-10-08 (×2): qty 1000

## 2018-10-08 MED ORDER — LIVING WELL WITH DIABETES BOOK
Freq: Once | Status: AC
  Administered 2018-10-08: 18:00:00

## 2018-10-08 MED ORDER — SODIUM CHLORIDE 0.9 % IV SOLN
600.0000 mg | Freq: Every day | INTRAVENOUS | Status: AC
  Administered 2018-10-08: 600 mg via INTRAVENOUS
  Filled 2018-10-08: qty 12

## 2018-10-08 MED ORDER — ZINC SULFATE 220 (50 ZN) MG PO CAPS: 220.0000 mg | ORAL_CAPSULE | Freq: Every day | ORAL | 3 refills | Status: DC

## 2018-10-08 MED ORDER — POTASSIUM CHLORIDE CRYS ER 20 MEQ PO TBCR
40.0000 meq | EXTENDED_RELEASE_TABLET | Freq: Once | ORAL | Status: DC
  Filled 2018-10-08: qty 2

## 2018-10-08 NOTE — Consult Note (Signed)
[facility]    Date of Admission:  10/07/2018     Total days of antibiotics 24               Reason for Consult: MRSA bacteremia / osteomyelitis.  Referring Provider: Isidoro Donning / [employer] Primary Care Provider: No primary care provider on file.   Assessment/Plan:  Yolanda [name] is a 43 y/o female with recent history of MRSA osteomyelitis of the right foot/ankle transferred to [facility] for further evaluation for orthopedic evalution and found to have MRSA bacteremia. Source of infection is likely her osteomyelitis, however she has had a PICC line in place for the last 3 weeks which is also likely seeded at this point. Orders placed for PIV placement and removal of PICC line. Will check TTE to rule out endocarditis. Await PICC line removal before repeating blood cultures. Change daptomycin to vancomycin. No surgical intervention at present and will likely require at least 4-6 weeks of antibiotic therapy pending testing results.    1. Discontinue daptomycin. 2. Start vancomycin per pharmacy consult. 3. PIV placement and remove PICC line. 4. TTE to rule out endocarditis.  5. Wound care per Dr. Lajoyce Corners 6. Will need repeat blood cultures.       [facility] Staphylococcus aureus bacteremia   Staphylococcus aureus bacteremia (SAB) is associated with a high rate of complications and mortality.  Specific aspects of clinical management are critical to optimizing the outcome of patients with SAB.  Therefore, the [facility] Antimicrobial Management Team [facility]) has initiated an intervention aimed at improving the management of SAB at [facility].  To do so, Infectious Diseases physicians are providing an evidence-based consult for the management of all patients with SAB.     Yes No Comments  Perform follow-up blood cultures (even if the patient is afebrile) to ensure clearance of bacteremia [x]  []  Pending removal of PICC line  Remove  vascular catheter and obtain follow-up blood cultures after the removal of the catheter [x]  []  Order for PIV insertion and PICC removal placed.   Perform echocardiography to evaluate for endocarditis (transthoracic ECHO is 40-50% sensitive, TEE is > 90% sensitive) [x]  []  TTE ordered.   Please keep in mind, that neither test can definitively EXCLUDE endocarditis, and that should clinical suspicion remain high for endocarditis the patient should then still be treated with an "endocarditis" duration of therapy = 6 weeks  Consult electrophysiologist to evaluate implanted cardiac device (pacemaker, ICD) []  [x]  NA  Ensure source control []  []  Source - osteo, PICC or possible endocarditis.   Have all abscesses been drained effectively? Have deep seeded infections (septic joints or osteomyelitis) had appropriate surgical debridement?  Investigate for "metastatic" sites of infection [x]  []  Does the patient have ANY symptom or physical exam finding that would suggest a deeper infection (back or neck pain that may be suggestive of vertebral osteomyelitis or epidural abscess, muscle pain that could be a symptom of pyomyositis)?  Keep in mind that for deep seeded infections MRI imaging with contrast is preferred rather than other often insensitive tests such as plain x-rays, especially early in a patient's presentation.  Change antibiotic therapy to ____vancomycin_____ []  []  Beta-lactam antibiotics are preferred for MSSA due to higher cure rates.   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 Chlorhexidine Gluconate Cloth  6 each Topical Q0600  . feeding supplement (PRO-STAT SUGAR FREE 64)  30 mL Oral BID  . heparin injection (subcutaneous)  5,000 Units Subcutaneous Q8H  . insulin aspart  0-5 Units Subcutaneous QHS  . insulin aspart  0-9 Units Subcutaneous TID WC  . insulin aspart  3 Units Subcutaneous TID WC  . insulin glargine  40 Units Subcutaneous BID  . metoprolol tartrate  12.5 mg Oral BID  . mupirocin ointment  1 application Nasal BID  . potassium chloride  40 mEq Oral Once  . zinc sulfate  220 mg Oral Daily     HPI: Yolanda [name] is a 43 y.o. female with previous medical history of type 2 diabetes complicated by diabetic neuropathy and foot ulcer/osteomyelitis most recently treated for MRSA bacteremia with daptomycin for 4 weeks admitted to [facility] for consultation with Dr. Lajoyce Corners.  Yolanda [name] was admitted to [facility] on 09/2718 from her wound care clinic due to worsening pain in her right lower extremity. MRI with increased extent of osteomyelitis involving the tarsal bones, talus, calcaneus, navicular and cuboid. Blood cultures obtained on 3/27 are positive for gram positive cocci in 1/4 bottles. Previous blood cultures from 08/24/18 and 09/11/18 were without growth. She was receiving Daptomycin at an outpatient infusion center and going to wound care 3 times per week. Vancomycin likely changed for ease of dosing. Her initial treatment for Daptomycin started on 09/14/2018 with end date scheduled for 10/19/2018.  Dr. 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 Her wound VAC was removed as she has good granulation tissue formation.   Yolanda [name] has been afebrile since admission with most recent WBC count of 5.5. She received 2 days of vancomycin and  piperacillin-tazobactam before being switched to Daptomycin. Initial plan was to discharge and continue with Daptomycin per previous order, however blood cultures drawn on transfer were BCID positive for MRSA. Cultures at [facility] are also positive for gram positive cocci with organism yet to be identified.   Yolanda [name] was being followed in [place], [place] where she was being treated for MRSA bacteremia with daptomycin which started on 09/14/2018 with end date scheduled for 10/19/2018 for a total of 6 weeks of therapy.   Review of Systems: Review of Systems  Constitutional: Negative for chills, fever, malaise/fatigue and weight loss.  Respiratory: Negative for cough, shortness of breath and wheezing.   Cardiovascular: Negative for chest pain and leg swelling.  Gastrointestinal: Negative for abdominal pain, constipation, diarrhea, nausea and vomiting.  Musculoskeletal: Negative for myalgias.  Skin: Negative for rash.     Past Medical History:  Diagnosis Date  . MRSA infection within last 3 months   . Type 2 diabetes mellitus treated with insulin (HCC)     Social History   Tobacco Use  . Smoking status: Current Some Day Smoker    Years: 3.00    Types: Cigarettes  . Smokeless tobacco: Never Used  Substance Use Topics  . Alcohol use: Not Currently  . Drug  use: Never    No family history on file.  No Known Allergies  OBJECTIVE: Blood pressure (!) 172/111, pulse (!) 102, temperature 97.7 F (36.5 C), temperature source Oral, resp. rate 18, height  (1.676 m), weight 77.1 kg, SpO2 100 %.  Physical Exam Constitutional:      General: She is not in acute distress.    Appearance: She is well-developed. She is not ill-appearing.     Comments: Lying in bed with head of bed elevated. Pleasant.   Cardiovascular:     Rate and Rhythm: Regular rhythm. Tachycardia present.     Heart sounds: Normal heart sounds. No murmur.     Comments: PICC line in left upper arm appears with  dressing that is clean and dry. No signs of infection at site.  Pulmonary:     Effort: Pulmonary effort is normal.     Breath sounds: Normal breath sounds.  Skin:    General: Skin is warm and dry.  Neurological:     Mental Status: She is alert and oriented to person, place, and time.  Psychiatric:        Mood and Affect: Mood normal.     Lab Results Lab Results  Component Value Date   WBC 5.5 10/08/2018   HGB 10.1 (L) 10/08/2018   HCT 31.7 (L) 10/08/2018   MCV 94.9 10/08/2018   PLT 205 10/08/2018    Lab Results  Component Value Date   CREATININE 0.63 10/08/2018   BUN 5 (L) 10/08/2018   NA 137 10/08/2018   K 3.4 (L) 10/08/2018   CL 105 10/08/2018   CO2 25 10/08/2018    Lab Results  Component Value Date   ALT 44 10/07/2018   AST 55 (H) 10/07/2018   ALKPHOS 402 (H) 10/07/2018   BILITOT 0.8 10/07/2018     Microbiology: Recent Results (from the past 240 hour(s))  Culture, blood (routine x 2)     Status: None (Preliminary result)   Collection Time: 10/07/18  3:20 AM  Result Value Ref Range Status   Specimen Description BLOOD RIGHT ARM  Final   Special Requests   Final    BOTTLES DRAWN AEROBIC AND ANAEROBIC Blood Culture results may not be optimal due to an excessive volume of blood received in culture bottles   Culture   Final    NO GROWTH 1 DAY Performed at [facility], [address], [place], [place] [address]    Report Status PENDING  Incomplete  Culture, blood (routine x 2)     Status: None (Preliminary result)   Collection Time: 10/07/18  3:29 AM  Result Value Ref Range Status   Specimen Description BLOOD RIGHT HAND  Final   Special Requests   Final    BOTTLES DRAWN AEROBIC ONLY Blood Culture adequate volume   Culture  Setup Time   Final    GRAM POSITIVE COCCI IN CLUSTERS AEROBIC BOTTLE ONLY Organism ID to follow CRITICAL RESULT CALLED TO, READ BACK BY AND VERIFIED WITH: PHARMD AMELIE SAINTCLAIR AT 1038 ON 10/08/2018 BY MHOUEGNIFIO    Culture    Final    NO GROWTH 1 DAY Performed at [facility], [address] [place], [place] [address]    Report Status PENDING  Incomplete  Blood Culture ID Panel (Reflexed)     Status: Abnormal   Collection Time: 10/07/18  3:29 AM  Result Value Ref Range Status   Enterococcus species NOT DETECTED NOT DETECTED Final   Listeria monocytogenes  NOT DETECTED NOT DETECTED Final   Staphylococcus species DETECTED (A) NOT DETECTED Final    Comment: CRITICAL RESULT CALLED TO, READ BACK BY AND VERIFIED WITH: PHARMD AMELIE SAINTCLAIR AT 1038 ON 10/08/2018 BY MHOUEGNIFIO    Staphylococcus aureus (BCID) DETECTED (A) NOT DETECTED Final    Comment: Methicillin (oxacillin)-resistant Staphylococcus aureus (MRSA). MRSA is predictably resistant to beta-lactam antibiotics (except ceftaroline). Preferred therapy is vancomycin unless clinically contraindicated. Patient requires contact precautions if  hospitalized. CRITICAL RESULT CALLED TO, READ BACK BY AND VERIFIED WITH: PHARMD AMELIE SAINTCLAIR AT 1038 ON 10/08/2018 BY MHOUEGNIFIO    Methicillin resistance DETECTED (A) NOT DETECTED Final    Comment: CRITICAL RESULT CALLED TO, READ BACK BY AND VERIFIED WITH: PHARMD AMELIE SAINTCLAIR AT 1038 ON 10/08/2018 BY MHOUEGNIFIO    Streptococcus species NOT DETECTED NOT DETECTED Final   Streptococcus agalactiae NOT DETECTED NOT DETECTED Final   Streptococcus pneumoniae NOT DETECTED NOT DETECTED Final   Streptococcus pyogenes NOT DETECTED NOT DETECTED Final   Acinetobacter baumannii NOT DETECTED NOT DETECTED Final   Enterobacteriaceae species NOT DETECTED NOT DETECTED Final   Enterobacter cloacae complex NOT DETECTED NOT DETECTED Final   Escherichia coli NOT DETECTED NOT DETECTED Final   Klebsiella oxytoca NOT DETECTED NOT DETECTED Final   Klebsiella pneumoniae NOT DETECTED NOT DETECTED Final   Proteus species NOT DETECTED NOT DETECTED Final   Serratia marcescens NOT DETECTED NOT DETECTED Final   Haemophilus  influenzae NOT DETECTED NOT DETECTED Final   Neisseria meningitidis NOT DETECTED NOT DETECTED Final   Pseudomonas aeruginosa NOT DETECTED NOT DETECTED Final   Candida albicans NOT DETECTED NOT DETECTED Final   Candida glabrata NOT DETECTED NOT DETECTED Final   Candida krusei NOT DETECTED NOT DETECTED Final   Candida parapsilosis NOT DETECTED NOT DETECTED Final   Candida tropicalis NOT DETECTED NOT DETECTED Final    Comment: Performed at [facility], [address] [place], [place] [address]  MRSA PCR Screening     Status: Abnormal   Collection Time: 10/07/18  5:34 AM  Result Value Ref Range Status   MRSA by PCR POSITIVE (A) NEGATIVE Final    Comment:        The GeneXpert MRSA Assay (FDA approved for NASAL specimens only), is one component of a comprehensive MRSA colonization surveillance program. It is not intended to diagnose MRSA infection nor to guide or monitor treatment for MRSA infections. RESULT CALLED TO, READ BACK BY AND VERIFIED WITH: B. DAVIS RN, AT [phone] 10/07/18 BY Renato Shin. VANHOOK Performed at [facility], [address] [place], [place] [address]   Urine Culture     Status: Abnormal   Collection Time: 10/07/18 10:30 AM  Result Value Ref Range Status   Specimen Description URINE, RANDOM  Final   Special Requests NONE  Final   Culture (A)  Final    <10,000 COLONIES/mL INSIGNIFICANT GROWTH Performed at [facility], [address] [place], [place] [address]    Report Status 10/08/2018 FINAL  Final     Marcos EkeGreg , NP [facility] [phone] Pager  10/08/2018  1:34 PM

## 2018-10-08 NOTE — Progress Notes (Signed)
Vive socks applied to left leg per MD instructions. Educated pt on purpose and proper application. Extra socks at bedside. Pt states understanding.

## 2018-10-08 NOTE — Progress Notes (Signed)
Patient ID: Yolanda [name], female   DOB: 26-Nov-1975, 43 y.o.   MRN: [id number] Patient is resting comfortably this morning.  Blood cultures from [place] show gram-positive cocci.  I have given the nurses the medical compression stockings for patient to wear for the leg ulcer.  She will wear the sock and sleeve during waking hours and the socks only 24 hours a day this should be changed daily.

## 2018-10-08 NOTE — Evaluation (Addendum)
Physical Therapy Evaluation Patient Details Name: Yolanda [name] MRN: [id number] DOB: 1975-11-23 Today's Date: 10/08/2018   History of Present Illness  Yolanda [name] is a 43 y.o. female admitted for Diabetic foot ulcer with osteomyelitis. She has PMH of  uncontrolled diabetic  neuropathy who presents with a chronic wound/ulcer over the lateral aspect of the right leg.  Patient has been undergoing wound care and treatment for several months. She is NWB on Rt leg unless has boot.  Clinical Impression   Pt admitted with above diagnosis. Pt currently with functional limitations and decreased functional mobility due to the deficits in LE strength and endurance further complicated with NWB status on Rt LE unless she has boot on. Pt will benefit from skilled PT to increase their independence and safety with mobility to allow discharge to home        Equipment Recommendations  3in1 (PT)  Home health PT with intermittent supervision for mobility/out of bed     Precautions / Restrictions Precautions Precautions: Fall Restrictions Weight Bearing Restrictions: Yes RLE Weight Bearing: Non weight bearing      Mobility  Bed Mobility Overal bed mobility: Modified Independent             General bed mobility comments: HOB elevated  Transfers Overall transfer level: Modified independent Equipment used: Rolling walker (2 wheeled) Transfers: Sit to/from [employer] Sit to Stand: Supervision;Modified independent (Device/Increase time) Stand pivot transfers: Modified independent (Device/Increase time);Supervision       General transfer comment: with RW due to WB status  Ambulation/Gait Ambulation/Gait assistance: Supervision Gait Distance (Feet): 10 Feet Assistive device: Rolling walker (2 wheeled)   Gait velocity: slower Gait velocity interpretation: <1.31 ft/sec, indicative of household ambulator General Gait Details: education for hop to pattern due to NWB on Rt and  she was able to show good return demonstration   Stairs Stairs: Yes Stairs assistance: Supervision Stair Management: No rails;Forwards;With walker Number of Stairs: 1 General stair comments: she only has one step to enter she normally holds onto her dad to assist with this, she was shown how to perform forward and backwards but she felt fwd would be easiest so she return demonstrated this however appears she does put some weight down on Rt leg despite cues not to.          Balance Overall balance assessment: Needs assistance   Sitting balance-Leahy Scale: Good       Standing balance-Leahy Scale: Poor Standing balance comment: needs UE support due to WB status         Pertinent Vitals/Pain Pain Assessment: No/denies pain    Home Living Family/patient expects to be discharged to:: Private residence Living Arrangements: Parent;Children Available Help at Discharge: Family Type of Home: House Home Access: Stairs to enter Entrance Stairs-Rails: None Entrance Stairs-Number of Steps: 1 Home Layout: Two level;Able to live on main level with bedroom/bathroom Home Equipment: Dan Humphreys - 2 wheels;Cane - quad      Prior Function Level of Independence: Independent               Hand Dominance   Dominant Hand: Right    Extremity/Trunk Assessment   Upper Extremity Assessment Upper Extremity Assessment: Overall WFL for tasks assessed    Lower Extremity Assessment Lower Extremity Assessment: Overall WFL for tasks assessed(mild leg weakness but WFL)    Cervical / Trunk Assessment Cervical / Trunk Assessment: Normal  Communication   Communication: No difficulties  Cognition Arousal/Alertness: Awake/alert Behavior During Therapy: WFL for tasks assessed/performed  Overall Cognitive Status: Within Functional Limits for tasks assessed                    General Comments General comments (skin integrity, edema, etc.): stocking and dressing appear clean dry and  intact         Assessment/Plan    PT Assessment Patient needs continued PT services  PT Problem List Decreased strength;Decreased range of motion;Decreased activity tolerance;Decreased balance;Decreased coordination;Decreased mobility;Pain       PT Treatment Interventions DME instruction;Gait training;Functional mobility training;Stair training;Therapeutic activities;Therapeutic exercise;Balance training;Neuromuscular re-education;Manual techniques;Modalities    PT Goals (Current goals can be found in the Care Plan section)  Acute Rehab PT Goals Patient Stated Goal: return home PT Goal Formulation: With patient Time For Goal Achievement: 10/22/18 Potential to Achieve Goals: Good    Frequency Min 3X/week   Barriers to discharge   none       AM-PAC PT "6 Clicks" Mobility  Outcome Measure Help needed turning from your back to your side while in a flat bed without using bedrails?: None Help needed moving from lying on your back to sitting on the side of a flat bed without using bedrails?: None Help needed moving to and from a bed to a chair (including a wheelchair)?: A Little Help needed standing up from a chair using your arms (e.g., wheelchair or bedside chair)?: None Help needed to walk in hospital room?: A Little Help needed climbing 3-5 steps with a railing? : A Lot 6 Click Score: 20    End of Session Equipment Utilized During Treatment: Gait belt Activity Tolerance: Patient tolerated treatment well Patient left: in bed;with call bell/phone within reach(no alarm indicated) Nurse Communication: Mobility status PT Visit Diagnosis: Unsteadiness on feet (R26.81);Other abnormalities of gait and mobility (R26.89);Muscle weakness (generalized) (M62.81);Difficulty in walking, not elsewhere classified (R26.2);Pain Pain - Right/Left: Right Pain - part of body: Ankle and joints of foot    Time: 1320-1345 PT Time Calculation (min) (ACUTE ONLY): 25 min   Charges:   PT  Evaluation $PT Eval Moderate Complexity: 1 Mod PT Treatments $Gait Training: 8-22 mins        Ivery Quale, PT,DPT Acute Rehabilitation Services Office [phone]   April Manson 10/08/2018, 2:03 PM

## 2018-10-08 NOTE — Progress Notes (Signed)
Consult discussed with Eber Jones, RN: Do not remove PICC. Pt will continue on IV antibiotics.

## 2018-10-08 NOTE — Progress Notes (Signed)
PHARMACY - PHYSICIAN COMMUNICATION CRITICAL VALUE ALERT - BLOOD CULTURE IDENTIFICATION (BCID)  Yolanda [name] is an 43 y.o. female who presented to [facility] on 10/07/2018 with a chief complaint of MRSA bacteremia.   Assessment:  43 year old female admitted with osteomyelitis of the right foot and ankle and MRSA bacteremia. She was originally treated at an OSH and started on Daptomycin 09/14/18 with planned stop date of 10/19/18. Unfortunately blood cultures from 3/29 are still positive in 1/4 for MRSA. Source likely right foot.   Name of physician (or Provider) Contacted: Rai/ID team   Current antibiotics: Daptomycin   Changes to prescribed antibiotics recommended:   Continue Daptomycin- ID to see for further recommendations   Results for orders placed or performed during the hospital encounter of 10/07/18  Blood Culture ID Panel (Reflexed) (Collected: 10/07/2018  3:29 AM)  Result Value Ref Range   Enterococcus species NOT DETECTED NOT DETECTED   Listeria monocytogenes NOT DETECTED NOT DETECTED   Staphylococcus species DETECTED (A) NOT DETECTED   Staphylococcus aureus (BCID) DETECTED (A) NOT DETECTED   Methicillin resistance DETECTED (A) NOT DETECTED   Streptococcus species NOT DETECTED NOT DETECTED   Streptococcus agalactiae NOT DETECTED NOT DETECTED   Streptococcus pneumoniae NOT DETECTED NOT DETECTED   Streptococcus pyogenes NOT DETECTED NOT DETECTED   Acinetobacter baumannii NOT DETECTED NOT DETECTED   Enterobacteriaceae species NOT DETECTED NOT DETECTED   Enterobacter cloacae complex NOT DETECTED NOT DETECTED   Escherichia coli NOT DETECTED NOT DETECTED   Klebsiella oxytoca NOT DETECTED NOT DETECTED   Klebsiella pneumoniae NOT DETECTED NOT DETECTED   Proteus species NOT DETECTED NOT DETECTED   Serratia marcescens NOT DETECTED NOT DETECTED   Haemophilus influenzae NOT DETECTED NOT DETECTED   Neisseria meningitidis NOT DETECTED NOT DETECTED   Pseudomonas aeruginosa NOT DETECTED  NOT DETECTED   Candida albicans NOT DETECTED NOT DETECTED   Candida glabrata NOT DETECTED NOT DETECTED   Candida krusei NOT DETECTED NOT DETECTED   Candida parapsilosis NOT DETECTED NOT DETECTED   Candida tropicalis NOT DETECTED NOT DETECTED    Sharin Mons, PharmD, BCPS, BCIDP Infectious Diseases Clinical Pharmacist Phone: [phone] 10/08/2018  11:00 AM

## 2018-10-08 NOTE — Progress Notes (Signed)
  Echocardiogram 2D Echocardiogram has been performed.  A limited echocardiogram was performed in accordance with the Director's protocol to limit patient to technician exposure during Covid-19.  Belva Chimes 10/08/2018, 2:32 PM

## 2018-10-08 NOTE — Progress Notes (Signed)
Pt made aware of cancelled discharge by  MD. Did educate on new IV antibiotics. Pt states understanding though disappointed.

## 2018-10-08 NOTE — Progress Notes (Signed)
PHARMACY CONSULT NOTE FOR:  OUTPATIENT  PARENTERAL ANTIBIOTIC THERAPY (OPAT)  Indication: Osteomyelitis Regimen: Daptomycin 600mg  IV daily End date: 10/19/18  IV antibiotic discharge orders are pended. To discharging provider:  please sign these orders via discharge navigator,  Select New Orders & click on the button choice - Manage This Unsigned Work.     Thank you for allowing pharmacy to be a part of this patient's care.  Link Snuffer, PharmD, BCPS, BCCCP Clinical Pharmacist Please refer to [facility] for [facility] Pharmacy numbers 10/08/2018, 7:07 AM

## 2018-10-08 NOTE — Progress Notes (Addendum)
Call received from [facility] in [place], [place]. Pt had pos. Blood culture 1/4 aerobic bottles with gram + cocci in clusters on 10/05/18.

## 2018-10-08 NOTE — Progress Notes (Addendum)
[facility]                                                                              Patient Demographics  Yolanda [name], is a 43 y.o. female, DOB - February 10, 1976, DJS:[id number]  Admit date - 10/07/2018   Admitting Physician Drema Dallas, MD  Outpatient Primary MD for the patient is No primary care provider on file.  Outpatient specialists:   LOS - 1  days   Medical records reviewed and are as summarized below:    No chief complaint on file.      Brief summary   43 year old female with uncontrolled diabetes mellitus type 2, diabetic foot ulcer, osteomyelitis and cellulitis with MRSA bacteremia, 4 weeks ongoing on daptomycin.  Patient was followed by surgery at [facility] who contacted Dr. Lajoyce Corners from orthopedic surgery for further management.  Patient was accepted to [facility].  Patient had an MRI at [facility] which showed osteomyelitis. Patient has a left arm PICC line placed outside facility  Assessment & Plan    Principal Problem:   Osteomyelitis of right ankle and foot (HCC) -MRI of the RLE at outside facility showed osteomyelitis, unable to find the MRI report in the care everywhere.  Patient has wound VAC on the right lower extremity.  Patient was transferred to [facility] for further management after consulting orthopedics, Dr. Lajoyce Corners -Discussed with Dr. Lajoyce Corners, who reviewed radiographs from [facility].  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 -She had been receiving wound VAC treatment changes 3 times a week in [place].   Active Problems: Persistent MRSA bacteremia -Repeat blood cultures at our facility on 3/29 now growing MRSA. -Patient was on daptomycin at the outside facility, started on 09/14/2018, supposed to finish on 4/10 for total 6 weeks. PICC line in left arm Now with blood cultures still growing MRSA, discussed with Dr. Orvan Falconer, will see the patient for further recommendations.   For now continue daptomycin. Obtain 2D echocardiogram    Diabetes mellitus type 2, uncontrolled, with complications (HCC) -Patient is on Lantus 50 units twice daily, pt states she has had hypoglycemia issues  -Placed on sliding scale insulin 3 times daily AC continue Lantus 40 units twice daily, added NovoLog meal coverage 3 units 3 times daily AC -Hemoglobin A1c 11.0  Hypokalemia Replaced  Right lower extremity DVT, diagnosed in 07/2018 -Patient was on Xarelto, completed starter pack however Xarelto was held due to bleeding issues at [facility] with debridement. -Right lower extremity venous Dopplers negative for DVT, will hold off on Xarelto   Severe protein calorie malnutrition -Albumin 2.2 -Placed on pro-stat, zinc daily  Essential hypertension BP readings have been elevated with tachycardia, started on Lopressor 12.5 mg twice daily.  For now, no suspicion for PE but if hypoxic, will rule out.  Obtain 2D echocardiogram. Labetalol IV as needed for hypertension, BP above 165  Code Status: Full code DVT Prophylaxis: For now continue heparin subcu Family Communication: Discussed in detail with the patient, all imaging results, lab results explained to the patient    Disposition Plan: Await ID recommendations prior to  discharge.  Time Spent in minutes 35 minutes  Procedures:  None  Consultants:   Orthopedics  Antimicrobials:   Anti-infectives (From admission, onward)   Start     Dose/Rate Route Frequency Ordered Stop   10/08/18 1030  DAPTOmycin (CUBICIN) 600 mg in sodium chloride 0.9 % IVPB     600 mg 224 mL/hr over 30 Minutes Intravenous Daily 10/08/18 1026 10/09/18 1959   10/08/18 0000  daptomycin (CUBICIN) IVPB     600 mg Intravenous Every 24 hours 10/08/18 0839 10/20/18 2359   10/07/18 1400  vancomycin (VANCOCIN) 1,000 mg in sodium chloride 0.9 % 250 mL IVPB  Status:  Discontinued     1,000 mg 250 mL/hr over 60 Minutes Intravenous Every 8 hours 10/07/18  0351 10/08/18 0829   10/07/18 0430  vancomycin (VANCOCIN) 1,500 mg in sodium chloride 0.9 % 500 mL IVPB     1,500 mg 250 mL/hr over 120 Minutes Intravenous  Once 10/07/18 0351 10/07/18 0835   10/07/18 0430  piperacillin-tazobactam (ZOSYN) IVPB 3.375 g  Status:  Discontinued     3.375 g 12.5 mL/hr over 240 Minutes Intravenous Every 8 hours 10/07/18 0351 10/08/18 0829         Medications  Scheduled Meds:  Chlorhexidine Gluconate Cloth  6 each Topical Q0600   feeding supplement (PRO-STAT SUGAR FREE 64)  30 mL Oral BID   heparin injection (subcutaneous)  5,000 Units Subcutaneous Q8H   insulin aspart  0-5 Units Subcutaneous QHS   insulin aspart  0-9 Units Subcutaneous TID WC   insulin glargine  40 Units Subcutaneous BID   mupirocin ointment  1 application Nasal BID   potassium chloride  40 mEq Oral Once   zinc sulfate  220 mg Oral Daily   Continuous Infusions:  DAPTOmycin (CUBICIN)  IV     PRN Meds:.acetaminophen **OR** acetaminophen, bisacodyl, diphenhydrAMINE, ketorolac, senna-docusate, traMADol, zolpidem      Subjective:   Yolanda [name] was seen and examined today.  Pain is controlled, no fevers or chills.  No acute issues overnight. Patient denies dizziness, chest pain, shortness of breath, abdominal pain, N/V/D/C, new weakness, numbess, tingling.  No acute issues overnight.  Objective:   Vitals:   10/07/18 0433 10/07/18 1508 10/07/18 2002 10/08/18 0309  BP: (!) 143/99 (!) 136/101 (!) 154/110 (!) 155/100  Pulse: (!) 106 (!) 115 (!) 105 (!) 101  Resp: Temp: 98.6 F (37 C) 98.6 F (37 C) 98.1 F (36.7 C) 98.1 F (36.7 C)  TempSrc: Oral Oral Oral Oral  SpO2: 96% 97% 97% 100%  Weight:      Height:        Intake/Output Summary (Last 24 hours) at 10/08/2018 1103 Last data filed at 10/08/2018 0832 Gross per 24 hour  Intake 2448.37 ml  Output --  Net 2448.37 ml     Wt Readings from Last 3 Encounters:  10/07/18 77.1 kg   Physical  Exam  General: Alert and oriented x 3, NAD  Eyes:   HEENT:  Atraumatic, normocephalic  Cardiovascular: S1 S2 clear, RRR. No pedal edema b/l  Respiratory: CTAB, no wheezing, rales or rhonchi  Gastrointestinal: Soft, nontender, nondistended, NBS  Ext: no pedal edema bilaterally  Neuro: no new deficits  Musculoskeletal: No cyanosis, clubbing  Skin: Compression stockings on right lower extremity  Psych: Normal affect and demeanor, alert and oriented x3     Data Reviewed:  I have personally reviewed following labs and imaging studies  Micro Results Recent Results (from  the past 240 hour(s))  Culture, blood (routine x 2)     Status: None (Preliminary result)   Collection Time: 10/07/18  3:29 AM  Result Value Ref Range Status   Specimen Description BLOOD RIGHT HAND  Final   Special Requests   Final    BOTTLES DRAWN AEROBIC ONLY Blood Culture adequate volume   Culture  Setup Time   Final    GRAM POSITIVE COCCI IN CLUSTERS AEROBIC BOTTLE ONLY Organism ID to follow CRITICAL RESULT CALLED TO, READ BACK BY AND VERIFIED WITH: PHARMD AMELIE SAINTCLAIR AT 1038 ON 10/08/2018 BY MHOUEGNIFIO Performed at [facility], [address], [place], [place] [address]    Culture PENDING  Incomplete   Report Status PENDING  Incomplete  Blood Culture ID Panel (Reflexed)     Status: Abnormal   Collection Time: 10/07/18  3:29 AM  Result Value Ref Range Status   Enterococcus species NOT DETECTED NOT DETECTED Final   Listeria monocytogenes NOT DETECTED NOT DETECTED Final   Staphylococcus species DETECTED (A) NOT DETECTED Final    Comment: CRITICAL RESULT CALLED TO, READ BACK BY AND VERIFIED WITH: PHARMD AMELIE SAINTCLAIR AT 1038 ON 10/08/2018 BY MHOUEGNIFIO    Staphylococcus aureus (BCID) DETECTED (A) NOT DETECTED Final    Comment: Methicillin (oxacillin)-resistant Staphylococcus aureus (MRSA). MRSA is predictably resistant to beta-lactam antibiotics (except ceftaroline). Preferred therapy  is vancomycin unless clinically contraindicated. Patient requires contact precautions if  hospitalized. CRITICAL RESULT CALLED TO, READ BACK BY AND VERIFIED WITH: PHARMD AMELIE SAINTCLAIR AT 1038 ON 10/08/2018 BY MHOUEGNIFIO    Methicillin resistance DETECTED (A) NOT DETECTED Final    Comment: CRITICAL RESULT CALLED TO, READ BACK BY AND VERIFIED WITH: PHARMD AMELIE SAINTCLAIR AT 1038 ON 10/08/2018 BY MHOUEGNIFIO    Streptococcus species NOT DETECTED NOT DETECTED Final   Streptococcus agalactiae NOT DETECTED NOT DETECTED Final   Streptococcus pneumoniae NOT DETECTED NOT DETECTED Final   Streptococcus pyogenes NOT DETECTED NOT DETECTED Final   Acinetobacter baumannii NOT DETECTED NOT DETECTED Final   Enterobacteriaceae species NOT DETECTED NOT DETECTED Final   Enterobacter cloacae complex NOT DETECTED NOT DETECTED Final   Escherichia coli NOT DETECTED NOT DETECTED Final   Klebsiella oxytoca NOT DETECTED NOT DETECTED Final   Klebsiella pneumoniae NOT DETECTED NOT DETECTED Final   Proteus species NOT DETECTED NOT DETECTED Final   Serratia marcescens NOT DETECTED NOT DETECTED Final   Haemophilus influenzae NOT DETECTED NOT DETECTED Final   Neisseria meningitidis NOT DETECTED NOT DETECTED Final   Pseudomonas aeruginosa NOT DETECTED NOT DETECTED Final   Candida albicans NOT DETECTED NOT DETECTED Final   Candida glabrata NOT DETECTED NOT DETECTED Final   Candida krusei NOT DETECTED NOT DETECTED Final   Candida parapsilosis NOT DETECTED NOT DETECTED Final   Candida tropicalis NOT DETECTED NOT DETECTED Final    Comment: Performed at [facility], [address] [place], [place] [address]  MRSA PCR Screening     Status: Abnormal   Collection Time: 10/07/18  5:34 AM  Result Value Ref Range Status   MRSA by PCR POSITIVE (A) NEGATIVE Final    Comment:        The GeneXpert MRSA Assay (FDA approved for NASAL specimens only), is one component of a comprehensive MRSA  colonization surveillance program. It is not intended to diagnose MRSA infection nor to guide or monitor treatment for MRSA infections. RESULT CALLED TO, READ BACK BY AND VERIFIED WITH: B. DAVIS RN, AT [phone] 10/07/18 BY D. Leighton Roach  Performed at [facility], [address], [place], [place] [address]   Urine Culture     Status: Abnormal   Collection Time: 10/07/18 10:30 AM  Result Value Ref Range Status   Specimen Description URINE, RANDOM  Final   Special Requests NONE  Final   Culture (A)  Final    <10,000 COLONIES/mL INSIGNIFICANT GROWTH Performed at [facility], [address], [place], [place] [address]    Report Status 10/08/2018 FINAL  Final    Radiology Reports Dg Chest 1 View  Result Date: 10/07/2018 CLINICAL DATA:  MRSA bacteremia, PICC line placement EXAM: CHEST  1 VIEW COMPARISON:  None. FINDINGS: Lungs are clear.  No pleural effusion or pneumothorax. The heart is normal in size. Left arm PICC terminates in the upper SVC, 4.5 cm above the cavoatrial junction. IMPRESSION: Left arm PICC terminates in the upper SVC, 4.5 cm above the cavoatrial junction. Electronically Signed   By: Charline Bills M.D.   On: 10/07/2018 06:05   Dg Humerus Left  Result Date: 10/07/2018 CLINICAL DATA:  MRSA bacteremia, status post PICC line placement EXAM: LEFT HUMERUS - 2+ VIEW COMPARISON:  None. FINDINGS: No fracture or dislocation is seen. The joint spaces are preserved. The visualized soft tissues are unremarkable. Visualized left lung is clear. Left arm PICC courses through the axillary region into the chest. IMPRESSION: Negative. Left arm PICC courses through the axillary region into the chest. Electronically Signed   By: Charline Bills M.D.   On: 10/07/2018 06:04   Vas [place] Lower Extremity Venous (dvt)  Result Date: 10/07/2018  Lower Venous Study Indications: Follow up DVT, off anticoagulants due to bleeding.  Performing Technologist: Jeb Levering RDMS, RVT  Examination  Guidelines: A complete evaluation includes B-mode imaging, spectral Doppler, color Doppler, and power Doppler as needed of all accessible portions of each vessel. Bilateral testing is considered an integral part of a complete examination. Limited examinations for reoccurring indications may be performed as noted.  Right Venous Findings: +---------+---------------+---------+-----------+----------+-------+            Compressibility Phasicity Spontaneity Properties Summary  +---------+---------------+---------+-----------+----------+-------+  CFV       Full            Yes       Yes                             +---------+---------------+---------+-----------+----------+-------+  SFJ       Full                                                      +---------+---------------+---------+-----------+----------+-------+  FV Prox   Full                                                      +---------+---------------+---------+-----------+----------+-------+  FV Mid    Full                                                      +---------+---------------+---------+-----------+----------+-------+  FV Distal Full                                                      +---------+---------------+---------+-----------+----------+-------+  PFV       Full                                                      +---------+---------------+---------+-----------+----------+-------+  POP       Full            Yes       Yes                             +---------+---------------+---------+-----------+----------+-------+  PTV       Full                                                      +---------+---------------+---------+-----------+----------+-------+  PERO      Full                                                      +---------+---------------+---------+-----------+----------+-------+  Left Venous Findings: +---+---------------+---------+-----------+----------+-------+      Compressibility Phasicity Spontaneity Properties Summary   +---+---------------+---------+-----------+----------+-------+  CFV Full            Yes       Yes                             +---+---------------+---------+-----------+----------+-------+    Summary: Right: There is no evidence of deep vein thrombosis in the lower extremity. No cystic structure found in the popliteal fossa. Ultrasound characteristics of enlarged lymph nodes are noted in the groin. Left: No evidence of common femoral vein obstruction. Ultrasound characteristics of enlarged lymph nodes noted in the groin.  *See table(s) above for measurements and observations.    Preliminary     Lab Data:  CBC: Recent Labs  Lab 10/08/18 0258  WBC 5.5  HGB 10.1*  HCT 31.7*  MCV 94.9  PLT 205   Basic Metabolic Panel: Recent Labs  Lab 10/07/18 0329 10/08/18 0258  NA 135 137  K 3.3* 3.4*  CL 104 105  CO2 23 25  GLUCOSE 328* 174*  BUN <5* 5*  CREATININE 0.70 0.63  CALCIUM 8.5* 8.6*  MG 1.9  --    GFR: Estimated Creatinine Clearance: 96 mL/min (by C-G formula based on SCr of 0.63 mg/dL). Liver Function Tests: Recent Labs  Lab 10/07/18 0329  AST 55*  ALT 44  ALKPHOS 402*  BILITOT 0.8  PROT 8.9*  ALBUMIN 2.2*   No results for input(s): LIPASE, AMYLASE in the last 168 hours. No results for input(s): AMMONIA in the last 168 hours. Coagulation Profile: Recent Labs  Lab 10/07/18 0329  INR 1.0   Cardiac Enzymes: No  results for input(s): CKTOTAL, CKMB, CKMBINDEX, TROPONINI in the last 168 hours. BNP (last 3 results) No results for input(s): PROBNP in the last 8760 hours. HbA1C: Recent Labs    10/07/18 0329  HGBA1C 11.0*   CBG: Recent Labs  Lab 10/07/18 0859 10/07/18 1222 10/07/18 1553 10/08/18 0149 10/08/18 0757  GLUCAP 169* 195* 273* 178* 154*   Lipid Profile: Recent Labs    10/07/18 0329  CHOL 185  HDL 50  LDLCALC 122*  TRIG 63  CHOLHDL 3.7   Thyroid Function Tests: No results for input(s): TSH, T4TOTAL, FREET4, T3FREE, THYROIDAB in the last 72  hours. Anemia Panel: No results for input(s): VITAMINB12, FOLATE, FERRITIN, TIBC, IRON, RETICCTPCT in the last 72 hours. Urine analysis: No results found for: COLORURINE, APPEARANCEUR, LABSPEC, PHURINE, GLUCOSEU, HGBUR, BILIRUBINUR, KETONESUR, PROTEINUR, UROBILINOGEN, NITRITE, LEUKOCYTESUR   Alois Colgan M.D. [facility] 10/08/2018, 11:03 AM  Pager: [phone] Between 7am to 7pm - call Pager - [phone]  After 7pm go to [email or web address] - password [email or web address]  Call night coverage person covering after 7pm

## 2018-10-08 NOTE — Progress Notes (Signed)
Pharmacy Antibiotic Note  Yolanda [name] is a 43 y.o. female admitted on 10/07/2018 with MRSA bacteremia.   Pharmacy has been consulted for vancomycin dosing. Patient has previously been on daptomycin from 09/14/18 that she was receiving at an outpatinet infusion center. She has had a PICC line in for several weeks and repeat blood cultures from 3/29 are persistently positive for MRSA. Patient has been on Vanc this admission with last dose this morning at 0545.   Plan: Re-start Vancomycin 1000 mg Q 12 hours  Predicted AUC 476 with SCr- 0.8  Monitor blood cultures, renal function and levels at steady state    Height: 5\' 6"  (167.6 cm) Weight: 170 lb (77.1 kg) IBW/kg (Calculated) : 59.3  Temp (24hrs), Avg:98.1 F (36.7 C), Min:97.7 F (36.5 C), Max:98.6 F (37 C)  Recent Labs  Lab 10/07/18 0329 10/08/18 0258  WBC  --  5.5  CREATININE 0.70 0.63    Estimated Creatinine Clearance: 96 mL/min (by C-G formula based on SCr of 0.63 mg/dL).    No Known Allergies  Antimicrobials this admission: 3/29 Vanc>> 3.29 Zosyn >>3/30 3/30 Dapto X 1     Microbiology results: 3/29 BCx: 1/4 MRSA   Thank you for allowing pharmacy to be a part of this patient's care.  Sharin Mons, PharmD, BCPS, BCIDP Infectious Diseases Clinical Pharmacist Phone: [phone] 10/08/2018 1:06 PM

## 2018-10-08 NOTE — Discharge Summary (Signed)
Physician Discharge Summary   Patient ID: Yolanda [name] MRN: [id number] DOB/AGE: 1975-09-23 43 y.o.  Admit date: 10/07/2018 Discharge date: 10/08/2018  Primary Care Physician:  No primary care provider on file.   Recommendations for Outpatient Follow-up:  1. Follow-up outpatient with Dr. Sharol Given in 2 weeks 2. Continue daptomycin IV till 10/19/2018  Home Health: Home health PT OT, RN Equipment/Devices:   Discharge Condition: stable  CODE STATUS: FULL Diet recommendation: Carb modified diet  Discharge Diagnoses:    . Osteomyelitis of ankle and foot ([place]) . Diabetes mellitus type 2, uncontrolled, with complications ([place]) . MRSA bacteremia Hypokalemia History of right lower extremity DVT  Consults:  Orthopedics, Dr. Sharol Given Infectious disease, Dr. Megan Salon via phone consultation    Allergies:  No Known Allergies   DISCHARGE MEDICATIONS: Allergies as of 10/08/2018   No Known Allergies     Medication List    STOP taking these medications   rivaroxaban 20 MG Tabs tablet Commonly known as:  XARELTO     TAKE these medications   BASAGLAR KWIKPEN [place] Inject 50 Units into the skin 2 (two) times daily.   daptomycin  IVPB Commonly known as:  CUBICIN Inject 600 mg into the vein daily for 12 doses. Indication:  Osteomyelitis Last Day of Therapy:  10/19/18 Labs - Once weekly:  CBC/D, BMP, and CPK Labs - Every other week:  ESR and CRP What changed:  additional instructions   traMADol 50 MG tablet Commonly known as:  ULTRAM Take 1 tablet (50 mg total) by mouth every 8 (eight) hours as needed for moderate pain or severe pain.   vitamin C 500 MG tablet Commonly known as:  ASCORBIC ACID Take 1 tablet (500 mg total) by mouth daily.   zinc sulfate 220 (50 Zn) MG capsule Take 1 capsule (220 mg total) by mouth daily.            Home Infusion Instuctions  (From admission, onward)         Start     Ordered   10/08/18 0000  Home infusion instructions [facility]  May follow [place] Dosing Protocol; May administer Cathflo as needed to maintain patency of vascular access device.; Flushing of vascular access device: per [facility] Protocol: 0.9% NaCl pre/post medica...    Question Answer Comment  Instructions May follow [place] Dosing Protocol   Instructions May administer Cathflo as needed to maintain patency of vascular access device.   Instructions Flushing of vascular access device: per [facility] Protocol: 0.9% NaCl pre/post medication administration and prn patency; Heparin 100 u/ml, 92m for implanted ports and Heparin 10u/ml, 552mfor all other central venous catheters.   Instructions May follow [facility] Anaphylaxis Protocol for First Dose Administration in the home: 0.9% NaCl at 25-50 ml/hr to maintain IV access for protocol meds. Epinephrine 0.3 ml IV/IM PRN and Benadryl 25-50 IV/IM PRN s/s of anaphylaxis.   Instructions [facility] Infusion Coordinator (RN) to assist per patient IV care needs in the home PRN.      10/08/18 0839           Brief H and P: For complete details please refer to admission H and P, but in [age]ear old female with uncontrolled diabetes mellitus type 2, diabetic foot ulcer, osteomyelitis and cellulitis with MRSA bacteremia, 4 weeks ongoing on daptomycin.  [facility]. DuSharol Givenrom orthopedic surgery for further management.  [facility]. Patient has a left arm PICC line placed outside facility  Hospital Course:  Osteomyelitis of right ankle and foot ([place]) -MRI of the RLE at outside facility showed osteomyelitis, unable to find the MRI report in the care everywhere.  Patient has wound VAC on the right lower extremity.  [facility] was transferred to [facility] management after consulting orthopedics, Dr. Sharol Given -Patient was seen by Dr. Sharol Given, who reviewed radiographs from  [facility]  No need for surgery at this time for Charcot's arthropathy, will take 6 months to heal, will place medical compression stockings in a.m. -She had been receiving wound VAC treatment changes 3 times a week in [place].  Per Dr. Sharol Given, wound [facility] has provided an excellent granulation bed and patient can now be transitioned to medical compression stockings which has been placed.   MRSA bacteremia -Repeat blood cultures negative so far. Patient was on daptomycin at the outside facility, PICC line in left arm.  Patient was started on daptomycin on 09/14/2018, supposed to be finished on 10/19/2018 for 6 weeks course. Blood cultures from [facility] 1/4 aerobic bottles gram-positive cocci in clusters on 3/27. Discussed in detail with infectious disease on-call, Dr. Megan Salon, who recommended to continue daptomycin course to complete on 4/10 as well as scheduled earlier.  No changes in the antibiotics or further work-up needed at this time. Will resume home health orders for the IV antibiotics.     Diabetes mellitus type 2, uncontrolled, with complications ([place]) -Continue insulin as per home regimen.  Recommended strongly to follow-up with her PCP or endocrinology for wound healing.  Given patient had reported events of hypoglycemia, avoiding increasing insulin regimen or adding another oral hypoglycemic agent.  Strongly recommend PCP follow-up closely and endocrinology referral. -Hemoglobin A1c 11.0  Hypokalemia Replaced  Right lower extremity DVT, diagnosed in 07/2018 -Patient was on Xarelto, completed starter pack however Xarelto was held due to bleeding issues at [facility] with debridement. -Right lower extremity Dopplers done on 3/29 showed no DVT.  Hence Xarelto was not restarted, will defer to PCP.   Severe protein calorie malnutrition -Albumin 2.2 -Continue zinc, vitamin C   Day of Discharge S: No acute complaints, ambulating, hoping to go home today.  No  fevers  BP (!) 155/100 (BP Location: Right Arm)   Pulse (!) 101   Temp 98.1 F (36.7 C) (Oral)   Resp 16   Ht '5\' 6"'  (1.676 m)   Wt 77.1 kg   SpO2 100%   BMI 27.44 kg/m   Physical Exam: General: Alert and awake oriented x3 not in any acute distress. HEENT: anicteric sclera, pupils reactive to light and accommodation CVS: S1-S2 clear no murmur rubs or gallops Chest: clear to auscultation bilaterally, no wheezing rales or rhonchi Abdomen: soft nontender, nondistended, normal bowel sounds Extremities: no cyanosis, clubbing or edema noted bilaterally Neuro: Cranial nerves II-XII intact, no focal neurological deficits   The results of significant diagnostics from this hospitalization (including imaging, microbiology, ancillary and laboratory) are listed below for reference.      Procedures/Studies:  Dg Chest 1 View  Result Date: 10/07/2018 CLINICAL DATA:  MRSA bacteremia, PICC line placement EXAM: CHEST  1 VIEW COMPARISON:  None. FINDINGS: Lungs are clear.  No pleural effusion or pneumothorax. The heart is normal in size. Left arm PICC terminates in the upper SVC, 4.5 cm above the cavoatrial junction. IMPRESSION: Left arm PICC terminates in the upper SVC, 4.5 cm above the cavoatrial junction. Electronically Signed  By: Julian Hy M.D.   On: 10/07/2018 06:05   Dg Humerus Left  Result Date: 10/07/2018 CLINICAL DATA:  MRSA bacteremia, status post PICC line placement EXAM: LEFT HUMERUS - 2+ VIEW COMPARISON:  None. FINDINGS: No fracture or dislocation is seen. The joint spaces are preserved. The visualized soft tissues are unremarkable. Visualized left lung is clear. Left arm PICC courses through the axillary region into the chest. IMPRESSION: Negative. Left arm PICC courses through the axillary region into the chest. Electronically Signed   By: Julian Hy M.D.   On: 10/07/2018 06:04   Vas [place] Lower Extremity Venous (dvt)  Result Date: 10/07/2018  Lower Venous Study  Indications: Follow up DVT, off anticoagulants due to bleeding.  Performing Technologist: June Leap RDMS, RVT  Examination Guidelines: A complete evaluation includes B-mode imaging, spectral Doppler, color Doppler, and power Doppler as needed of all accessible portions of each vessel. Bilateral testing is considered an integral part of a complete examination. Limited examinations for reoccurring indications may be performed as noted.  Right Venous Findings: +---------+---------------+---------+-----------+----------+-------+          CompressibilityPhasicitySpontaneityPropertiesSummary +---------+---------------+---------+-----------+----------+-------+ CFV      Full           Yes      Yes                          +---------+---------------+---------+-----------+----------+-------+ SFJ      Full                                                 +---------+---------------+---------+-----------+----------+-------+ FV Prox  Full                                                 +---------+---------------+---------+-----------+----------+-------+ FV Mid   Full                                                 +---------+---------------+---------+-----------+----------+-------+ FV DistalFull                                                 +---------+---------------+---------+-----------+----------+-------+ PFV      Full                                                 +---------+---------------+---------+-----------+----------+-------+ POP      Full           Yes      Yes                          +---------+---------------+---------+-----------+----------+-------+ PTV      Full                                                 +---------+---------------+---------+-----------+----------+-------+  PERO     Full                                                 +---------+---------------+---------+-----------+----------+-------+  Left Venous Findings:  +---+---------------+---------+-----------+----------+-------+    CompressibilityPhasicitySpontaneityPropertiesSummary +---+---------------+---------+-----------+----------+-------+ CFVFull           Yes      Yes                          +---+---------------+---------+-----------+----------+-------+    Summary: Right: There is no evidence of deep vein thrombosis in the lower extremity. No cystic structure found in the popliteal fossa. Ultrasound characteristics of enlarged lymph nodes are noted in the groin. Left: No evidence of common femoral vein obstruction. Ultrasound characteristics of enlarged lymph nodes noted in the groin.  *See table(s) above for measurements and observations.    Preliminary       LAB RESULTS: Basic Metabolic Panel: Recent Labs  Lab 10/07/18 0329 10/08/18 0258  NA 135 137  K 3.3* 3.4*  CL 104 105  CO2 23 25  GLUCOSE 328* 174*  BUN <5* 5*  CREATININE 0.70 0.63  CALCIUM 8.5* 8.6*  MG 1.9  --    Liver Function Tests: Recent Labs  Lab 10/07/18 0329  AST 55*  ALT 44  ALKPHOS 402*  BILITOT 0.8  PROT 8.9*  ALBUMIN 2.2*   No results for input(s): LIPASE, AMYLASE in the last 168 hours. No results for input(s): AMMONIA in the last 168 hours. CBC: Recent Labs  Lab 10/08/18 0258  WBC 5.5  HGB 10.1*  HCT 31.7*  MCV 94.9  PLT 205   Cardiac Enzymes: No results for input(s): CKTOTAL, CKMB, CKMBINDEX, TROPONINI in the last 168 hours. BNP: Invalid input(s): POCBNP CBG: Recent Labs  Lab 10/08/18 0149 10/08/18 0757  GLUCAP 178* 154*      Disposition and Follow-up: Discharge Instructions    Diet Carb Modified   Complete by:  As directed    Discharge instructions   Complete by:  As directed    wear the sock and sleeve during waking hours and the socks only 24 hours a day, this should be changed daily.   Home infusion instructions [facility] May follow [place] Dosing Protocol; May administer Cathflo as needed to maintain  patency of vascular access device.; Flushing of vascular access device: per [facility] Protocol: 0.9% NaCl pre/post medica...   Complete by:  As directed    Instructions:  May follow [place] Dosing Protocol   Instructions:  May administer Cathflo as needed to maintain patency of vascular access device.   Instructions:  Flushing of vascular access device: per [facility] Protocol: 0.9% NaCl pre/post medication administration and prn patency; Heparin 100 u/ml, 76m for implanted ports and Heparin 10u/ml, 545mfor all other central venous catheters.   Instructions:  May follow [facility] Anaphylaxis Protocol for First Dose Administration in the home: 0.9% NaCl at 25-50 ml/hr to maintain IV access for protocol meds. Epinephrine 0.3 ml IV/IM PRN and Benadryl 25-50 IV/IM PRN s/s of anaphylaxis.   Instructions:  [facility] Coord[facility] (RN) to assist per patient IV care needs in the home PRN.   Increase activity slowly   Complete by:  As directed        DISPOSITION: Home with home health PT, RN  DISCHARGE FOLLOW-UP Follow-up Information    Newt Minion, MD Follow up in 2 week(s).   Specialty:  Orthopedic Surgery Contact information: [place] [address] [phone]            Time coordinating discharge:  40 minutes  Signed:   Estill Cotta M.D. [facility] 10/08/2018, 10:18 AM

## 2018-10-09 ENCOUNTER — Inpatient Hospital Stay (HOSPITAL_COMMUNITY): Payer: Medicaid - Out of State

## 2018-10-09 DIAGNOSIS — R931 Abnormal findings on diagnostic imaging of heart and coronary circulation: Secondary | ICD-10-CM

## 2018-10-09 DIAGNOSIS — E1169 Type 2 diabetes mellitus with other specified complication: Principal | ICD-10-CM

## 2018-10-09 DIAGNOSIS — R945 Abnormal results of liver function studies: Secondary | ICD-10-CM

## 2018-10-09 DIAGNOSIS — F17210 Nicotine dependence, cigarettes, uncomplicated: Secondary | ICD-10-CM

## 2018-10-09 DIAGNOSIS — B9562 Methicillin resistant Staphylococcus aureus infection as the cause of diseases classified elsewhere: Secondary | ICD-10-CM

## 2018-10-09 LAB — BASIC METABOLIC PANEL
Anion gap: 8 (ref 5–15)
BUN: 8 mg/dL (ref 6–20)
CO2: 25 mmol/L (ref 22–32)
Calcium: 9 mg/dL (ref 8.9–10.3)
Chloride: 109 mmol/L (ref 98–111)
Creatinine, Ser: 0.59 mg/dL (ref 0.44–1.00)
GFR calc Af Amer: >60 mL/min (ref >60)
GFR calc non Af Amer: >60 mL/min (ref >60)
Glucose, Bld: 156 mg/dL — ABNORMAL HIGH (ref 70–99)
Potassium: 3.2 mmol/L — ABNORMAL LOW (ref 3.5–5.1)
Sodium: 142 mmol/L (ref 135–145)

## 2018-10-09 LAB — HEPATIC FUNCTION PANEL
ALT: 38 U/L (ref 0–44)
AST: 38 U/L (ref 15–41)
Albumin: 2.2 g/dL — ABNORMAL LOW (ref 3.5–5.0)
Alkaline Phosphatase: 366 U/L — ABNORMAL HIGH (ref 38–126)
BILIRUBIN DIRECT: 0.4 mg/dL — AB (ref 0.0–0.2)
Indirect Bilirubin: 0.6 mg/dL (ref 0.3–0.9)
Total Bilirubin: 1 mg/dL (ref 0.3–1.2)
Total Protein: 8.6 g/dL — ABNORMAL HIGH (ref 6.5–8.1)

## 2018-10-09 LAB — CBC
HCT: 31.8 % — ABNORMAL LOW (ref 36.0–46.0)
Hemoglobin: 10 g/dL — ABNORMAL LOW (ref 12.0–15.0)
MCH: 29.1 pg (ref 26.0–34.0)
MCHC: 31.4 g/dL (ref 30.0–36.0)
MCV: 92.4 fL (ref 80.0–100.0)
Platelets: 215 10*3/uL (ref 150–400)
RBC: 3.44 MIL/uL — ABNORMAL LOW (ref 3.87–5.11)
RDW: 12.9 % (ref 11.5–15.5)
WBC: 5.4 10*3/uL (ref 4.0–10.5)
nRBC: 0 % (ref 0.0–0.2)

## 2018-10-09 LAB — GLUCOSE, CAPILLARY
Glucose-Capillary: 115 mg/dL — ABNORMAL HIGH (ref 70–99)
Glucose-Capillary: 178 mg/dL — ABNORMAL HIGH (ref 70–99)
Glucose-Capillary: 76 mg/dL (ref 70–99)
Glucose-Capillary: 278 mg/dL — ABNORMAL HIGH (ref 70–99)

## 2018-10-09 MED ORDER — ADULT MULTIVITAMIN W/MINERALS CH
1.0000 | ORAL_TABLET | Freq: Every day | ORAL | Status: DC
  Administered 2018-10-09 – 2018-10-15 (×7): 1 via ORAL
  Filled 2018-10-09 (×7): qty 1

## 2018-10-09 MED ORDER — VANCOMYCIN HCL 10 G IV SOLR
1250.0000 mg | Freq: Two times a day (BID) | INTRAVENOUS | Status: DC
  Administered 2018-10-09 – 2018-10-11 (×4): 1250 mg via INTRAVENOUS
  Filled 2018-10-09 (×6): qty 1250

## 2018-10-09 MED ORDER — POTASSIUM CHLORIDE CRYS ER 20 MEQ PO TBCR
40.0000 meq | EXTENDED_RELEASE_TABLET | Freq: Once | ORAL | Status: AC
  Administered 2018-10-09: 40 meq via ORAL
  Filled 2018-10-09: qty 2

## 2018-10-09 MED ORDER — METOPROLOL TARTRATE 25 MG PO TABS
25.0000 mg | ORAL_TABLET | Freq: Two times a day (BID) | ORAL | Status: DC
  Administered 2018-10-09 – 2018-10-15 (×12): 25 mg via ORAL
  Filled 2018-10-09 (×13): qty 1

## 2018-10-09 MED ORDER — AMLODIPINE BESYLATE 5 MG PO TABS
5.0000 mg | ORAL_TABLET | Freq: Every day | ORAL | Status: DC
  Administered 2018-10-09 – 2018-10-15 (×7): 5 mg via ORAL
  Filled 2018-10-09 (×7): qty 1

## 2018-10-09 NOTE — Progress Notes (Signed)
[facility]                                                                              Patient Demographics  Yolanda [name], is a 43 y.o. female, DOB - [birth year], XNA:[id number]  Admit date - 10/07/2018   Admitting Physician Allie Bossier, MD  Outpatient Primary MD for the patient is No primary care provider on file.  Outpatient specialists:   LOS - 2  days   Medical records reviewed and are as summarized below:    No chief complaint on file.      Brief summary   43 year old female with uncontrolled diabetes mellitus type 2, diabetic foot ulcer, osteomyelitis and cellulitis with MRSA bacteremia, 4 weeks ongoing on daptomycin.  Patient was followed by surgery at [facility] who contacted Dr. Sharol Given from orthopedic surgery for further management.  Patient was accepted to [facility].  Patient had an MRI at [facility] which showed osteomyelitis. Patient has a left arm PICC line placed outside facility  Assessment & Plan    Principal Problem:   Osteomyelitis of right ankle and foot ([place]) -MRI of the RLE at outside facility showed osteomyelitis, unable to find the MRI report in the care everywhere.  Patient has wound VAC on the right lower extremity.  [facility] was transferred to [facility] for [facility] management after consulting orthopedics, Dr. Sharol Given -Orthopedics was consulted, seen by Dr. Sharol Given who reviewed radiographs from [facility].  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 -She had been receiving wound VAC treatment changes 3 times a week in [place].   Active Problems: Persistent MRSA bacteremia -Repeat blood cultures at our facility on 3/29 now growing MRSA. -Patient was on daptomycin at the outside facility, started on 09/14/2018, supposed to finish on 4/10 for total 6 weeks. PICC line in left arm -Given persistent MRSA bacteremia, ID consulted, discontinue daptomycin, PICC line removed -2D echo  showed EF 60 to 65%, mitral valve thickened and calcified cannot rule out small vegetation on the LV side of the posterior leaflet, aortic valve mild calcification, cannot rule out small vegetation on LVOT side -Continue IV vancomycin, will need to either pursue TEE versus treat for presumed endocarditis, will follow ID recommendations -Repeat blood cultures in a.m.     Diabetes mellitus type 2, uncontrolled, with complications ([place]) -Patient is on Lantus 50 units twice daily, pt states she has had hypoglycemia issues  -CBGs better this morning, continue Lantus 40 units twice daily, meal coverage, sliding scale insulin -Hemoglobin A1c 11.0  Hypokalemia Replaced  Right lower extremity DVT, diagnosed in 07/2018 -Patient was on Xarelto, completed starter pack however Xarelto was held due to bleeding issues at [facility] with debridement. -Right lower extremity venous Dopplers negative for DVT, will hold off on Xarelto   Severe protein calorie malnutrition -Albumin 2.2 -Placed on pro-stat, zinc daily  Essential hypertension -BP still elevated, increase Lopressor 25 mg twice a day, added Norvasc 5 mg daily  - Labetalol IV as needed for hypertension, BP above 165  Elevated alk phos -Unclear etiology, mild elevation in the direct bilirubin, daptomycin also  causes abnormal LFTs -Alk phos trending down 402 with the time of admission, 366 today - obtain right upper quadrant ultrasound, GGT, AMA, ANA, SMA  Code Status: Full code DVT Prophylaxis: For now continue heparin subcu Family Communication: Discussed in detail with the patient, all imaging results, lab results explained to the patient    Disposition Plan:   Time Spent in minutes 35 minutes  Procedures:  None  Consultants:   Orthopedics Infectious disease  Antimicrobials:   Anti-infectives (From admission, onward)   Start     Dose/Rate Route Frequency Ordered Stop   10/09/18 1800  vancomycin (VANCOCIN) 1,250 mg  in sodium chloride 0.9 % 250 mL IVPB     1,250 mg 166.7 mL/hr over 90 Minutes Intravenous Every 12 hours 10/09/18 0831     10/08/18 1745  vancomycin (VANCOCIN) 1,000 mg in sodium chloride 0.9 % 250 mL IVPB  Status:  Discontinued     1,000 mg 250 mL/hr over 60 Minutes Intravenous Every 12 hours 10/08/18 1315 10/09/18 0831   10/08/18 1030  DAPTOmycin (CUBICIN) 600 mg in sodium chloride 0.9 % IVPB     600 mg 224 mL/hr over 30 Minutes Intravenous Daily 10/08/18 1026 10/08/18 1149   10/08/18 0000  daptomycin (CUBICIN) IVPB  Status:  Discontinued     600 mg Intravenous Every 24 hours 10/08/18 0839 10/08/18    10/07/18 1400  vancomycin (VANCOCIN) 1,000 mg in sodium chloride 0.9 % 250 mL IVPB  Status:  Discontinued     1,000 mg 250 mL/hr over 60 Minutes Intravenous Every 8 hours 10/07/18 0351 10/08/18 0829   10/07/18 0430  vancomycin (VANCOCIN) 1,500 mg in sodium chloride 0.9 % 500 mL IVPB     1,500 mg 250 mL/hr over 120 Minutes Intravenous  Once 10/07/18 0351 10/07/18 0835   10/07/18 0430  piperacillin-tazobactam (ZOSYN) IVPB 3.375 g  Status:  Discontinued     3.375 g 12.5 mL/hr over 240 Minutes Intravenous Every 8 hours 10/07/18 0351 10/08/18 0829         Medications  Scheduled Meds: . Chlorhexidine Gluconate Cloth  6 each Topical Q0600  . feeding supplement (PRO-STAT SUGAR FREE 64)  30 mL Oral BID  . heparin injection (subcutaneous)  5,000 Units Subcutaneous Q8H  . insulin aspart  0-5 Units Subcutaneous QHS  . insulin aspart  0-9 Units Subcutaneous TID WC  . insulin aspart  3 Units Subcutaneous TID WC  . insulin glargine  40 Units Subcutaneous BID  . metoprolol tartrate  12.5 mg Oral BID  . mupirocin ointment  1 application Nasal BID  . potassium chloride  40 mEq Oral Once  . zinc sulfate  220 mg Oral Daily   Continuous Infusions: . vancomycin     PRN Meds:.acetaminophen **OR** acetaminophen, bisacodyl, diphenhydrAMINE, ketorolac, labetalol, senna-docusate, traMADol, zolpidem       Subjective:   [name] was seen and examined today.  Afebrile, no acute complaints per patient.  No acute events overnight.  Denies any pain in the right lower extremity.  Patient denies dizziness, chest pain, shortness of breath, abdominal pain, N/V/D/C, new weakness, numbess, tingling.  No acute issues overnight.  Objective:   Vitals:   10/08/18 1255 10/08/18 1537 10/08/18 1937 10/09/18 0350  BP: (!) 147/92 (!) 150/98 (!) 146/97 (!) 155/102  Pulse: (!) 101 (!) 104 (!) 104 99  Resp:  '16 15 15  ' Temp:  98.5 F (36.9 C) 98.1 F (36.7 C) 98.2 F (36.8 C)  TempSrc:  Oral Oral Oral  SpO2:  100% 100% 98%  Weight:      Height:        Intake/Output Summary (Last 24 hours) at 10/09/2018 0958 Last data filed at 10/08/2018 1542 Gross per 24 hour  Intake 472 ml  Output 600 ml  Net -128 ml     Wt Readings from Last 3 Encounters:  10/07/18 77.1 kg   Physical Exam  General: Alert and oriented x 3, NAD  Eyes:   HEENT:  Atraumatic, normocephalic  Cardiovascular: S1 S2 clear, RRR. No pedal edema b/l  Respiratory: CTAB, no wheezing, rales or rhonchi  Gastrointestinal: Soft, nontender, nondistended, NBS  Ext: Compression stocking on the right lower extremity, no pedal edema  Neuro: no new deficits  Musculoskeletal: No cyanosis, clubbing  Skin: Compression stocking on the right lower extremity  Psych: Normal affect and demeanor, alert and oriented x3      Data Reviewed:  I have personally reviewed following labs and imaging studies  Micro Results Recent Results (from the past 240 hour(s))  Culture, blood (routine x 2)     Status: None (Preliminary result)   Collection Time: 10/07/18  3:20 AM  Result Value Ref Range Status   Specimen Description BLOOD RIGHT ARM  Final   Special Requests   Final    BOTTLES DRAWN AEROBIC AND ANAEROBIC Blood Culture results may not be optimal due to an excessive volume of blood received in culture bottles   Culture   Final     NO GROWTH 2 DAYS Performed at [facility] [address] [place], [place] [address]    Report Status PENDING  Incomplete  Culture, blood (routine x 2)     Status: Abnormal (Preliminary result)   Collection Time: 10/07/18  3:29 AM  Result Value Ref Range Status   Specimen Description BLOOD RIGHT HAND  Final   Special Requests   Final    BOTTLES DRAWN AEROBIC ONLY Blood Culture adequate volume   Culture  Setup Time   Final    GRAM POSITIVE COCCI IN CLUSTERS AEROBIC BOTTLE ONLY CRITICAL RESULT CALLED TO, READ BACK BY AND VERIFIED WITH: PHARMD AMELIE SAINTCLAIR AT 2956 ON 10/08/2018 BY MHOUEGNIFIO    Culture (A)  Final    STAPHYLOCOCCUS AUREUS SUSCEPTIBILITIES TO FOLLOW Performed at [facility], [address], [place], [place] [address]    Report Status PENDING  Incomplete  Blood Culture ID Panel (Reflexed)     Status: Abnormal   Collection Time: 10/07/18  3:29 AM  Result Value Ref Range Status   Enterococcus species NOT DETECTED NOT DETECTED Final   Listeria monocytogenes NOT DETECTED NOT DETECTED Final   Staphylococcus species DETECTED (A) NOT DETECTED Final    Comment: CRITICAL RESULT CALLED TO, READ BACK BY AND VERIFIED WITH: PHARMD AMELIE SAINTCLAIR AT 1038 ON 10/08/2018 BY MHOUEGNIFIO    Staphylococcus aureus (BCID) DETECTED (A) NOT DETECTED Final    Comment: Methicillin (oxacillin)-resistant Staphylococcus aureus (MRSA). MRSA is predictably resistant to beta-lactam antibiotics (except ceftaroline). Preferred therapy is vancomycin unless clinically contraindicated. Patient requires contact precautions if  hospitalized. CRITICAL RESULT CALLED TO, READ BACK BY AND VERIFIED WITH: PHARMD AMELIE SAINTCLAIR AT 6578 ON 10/08/2018 BY MHOUEGNIFIO    Methicillin resistance DETECTED (A) NOT DETECTED Final    Comment: CRITICAL RESULT CALLED TO, READ BACK BY AND VERIFIED WITH: PHARMD AMELIE SAINTCLAIR AT 4696 ON 10/08/2018 BY MHOUEGNIFIO    Streptococcus species NOT DETECTED  NOT DETECTED Final   Streptococcus agalactiae NOT DETECTED NOT  DETECTED Final   Streptococcus pneumoniae NOT DETECTED NOT DETECTED Final   Streptococcus pyogenes NOT DETECTED NOT DETECTED Final   Acinetobacter baumannii NOT DETECTED NOT DETECTED Final   Enterobacteriaceae species NOT DETECTED NOT DETECTED Final   Enterobacter cloacae complex NOT DETECTED NOT DETECTED Final   Escherichia coli NOT DETECTED NOT DETECTED Final   Klebsiella oxytoca NOT DETECTED NOT DETECTED Final   Klebsiella pneumoniae NOT DETECTED NOT DETECTED Final   Proteus species NOT DETECTED NOT DETECTED Final   Serratia marcescens NOT DETECTED NOT DETECTED Final   Haemophilus influenzae NOT DETECTED NOT DETECTED Final   Neisseria meningitidis NOT DETECTED NOT DETECTED Final   Pseudomonas aeruginosa NOT DETECTED NOT DETECTED Final   Candida albicans NOT DETECTED NOT DETECTED Final   Candida glabrata NOT DETECTED NOT DETECTED Final   Candida krusei NOT DETECTED NOT DETECTED Final   Candida parapsilosis NOT DETECTED NOT DETECTED Final   Candida tropicalis NOT DETECTED NOT DETECTED Final    Comment: Performed at [facility], [place] [address], [place], [place] [address]  MRSA PCR Screening     Status: Abnormal   Collection Time: 10/07/18  5:34 AM  Result Value Ref Range Status   MRSA by PCR POSITIVE (A) NEGATIVE Final    Comment:        The GeneXpert MRSA Assay (FDA approved for NASAL specimens only), is one component of a comprehensive MRSA colonization surveillance program. It is not intended to diagnose MRSA infection nor to guide or monitor treatment for MRSA infections. RESULT CALLED TO, READ BACK BY AND VERIFIED WITH: B. DAVIS RN, AT [phone] 10/07/18 BY Rush Landmark Performed at [facility], [place] [address], [place], [place] [address]   Urine Culture     Status: Abnormal   Collection Time: 10/07/18 10:30 AM  Result Value Ref Range Status   Specimen Description URINE, RANDOM  Final   Special  Requests NONE  Final   Culture (A)  Final    <10,000 COLONIES/mL INSIGNIFICANT GROWTH Performed at [facility] [address], [place], [place] [address]    Report Status 10/08/2018 FINAL  Final    Radiology Reports Dg Chest 1 View  Result Date: 10/07/2018 CLINICAL DATA:  MRSA bacteremia, PICC line placement EXAM: CHEST  1 VIEW COMPARISON:  None. FINDINGS: Lungs are clear.  No pleural effusion or pneumothorax. The heart is normal in size. Left arm PICC terminates in the upper SVC, 4.5 cm above the cavoatrial junction. IMPRESSION: Left arm PICC terminates in the upper SVC, 4.5 cm above the cavoatrial junction. Electronically Signed   By: Julian Hy M.D.   On: 10/07/2018 06:05   Dg Humerus Left  Result Date: 10/07/2018 CLINICAL DATA:  MRSA bacteremia, status post PICC line placement EXAM: LEFT HUMERUS - 2+ VIEW COMPARISON:  None. FINDINGS: No fracture or dislocation is seen. The joint spaces are preserved. The visualized soft tissues are unremarkable. Visualized left lung is clear. Left arm PICC courses through the axillary region into the chest. IMPRESSION: Negative. Left arm PICC courses through the axillary region into the chest. Electronically Signed   By: Julian Hy M.D.   On: 10/07/2018 06:04   Vas [place] Lower Extremity Venous (dvt)  Result Date: 10/08/2018  Lower Venous Study Indications: Follow up DVT, off anticoagulants due to bleeding.  Performing Technologist: June Leap RDMS, RVT  Examination Guidelines: A complete evaluation includes B-mode imaging, spectral Doppler, color Doppler, and power Doppler as needed of all accessible portions of  each vessel. Bilateral testing is considered an integral part of a complete examination. Limited examinations for reoccurring indications may be performed as noted.  Right Venous Findings: +---------+---------------+---------+-----------+----------+-------+          CompressibilityPhasicitySpontaneityPropertiesSummary  +---------+---------------+---------+-----------+----------+-------+ CFV      Full           Yes      Yes                          +---------+---------------+---------+-----------+----------+-------+ SFJ      Full                                                 +---------+---------------+---------+-----------+----------+-------+ FV Prox  Full                                                 +---------+---------------+---------+-----------+----------+-------+ FV Mid   Full                                                 +---------+---------------+---------+-----------+----------+-------+ FV DistalFull                                                 +---------+---------------+---------+-----------+----------+-------+ PFV      Full                                                 +---------+---------------+---------+-----------+----------+-------+ POP      Full           Yes      Yes                          +---------+---------------+---------+-----------+----------+-------+ PTV      Full                                                 +---------+---------------+---------+-----------+----------+-------+ PERO     Full                                                 +---------+---------------+---------+-----------+----------+-------+  Left Venous Findings: +---+---------------+---------+-----------+----------+-------+    CompressibilityPhasicitySpontaneityPropertiesSummary +---+---------------+---------+-----------+----------+-------+ CFVFull           Yes      Yes                          +---+---------------+---------+-----------+----------+-------+    Summary: Right: There is no evidence of deep vein thrombosis in the lower extremity. No cystic structure found in the popliteal fossa.  Ultrasound characteristics of enlarged lymph nodes are noted in the groin. Left: No evidence of common femoral vein obstruction. Ultrasound characteristics of  enlarged lymph nodes noted in the groin.  *See table(s) above for measurements and observations. Electronically signed by Ruta [place] MD on 10/08/2018 at 8:49:33 PM.    Final     Lab Data:  CBC: Recent Labs  Lab 10/08/18 0258 10/09/18 0616  WBC 5.5 5.4  HGB 10.1* 10.0*  HCT 31.7* 31.8*  MCV 94.9 92.4  PLT 205 818   Basic Metabolic Panel: Recent Labs  Lab 10/07/18 0329 10/08/18 0258 10/09/18 0616  NA 135 137 142  K 3.3* 3.4* 3.2*  CL 104 105 109  CO2 '23 25 25  ' GLUCOSE 328* 174* 156*  BUN <5* 5* 8  CREATININE 0.70 0.63 0.59  CALCIUM 8.5* 8.6* 9.0  MG 1.9  --   --    GFR: Estimated Creatinine Clearance: 96 mL/min (by C-G formula based on SCr of 0.59 mg/dL). Liver Function Tests: Recent Labs  Lab 10/07/18 0329 10/09/18 0616  AST 55* 38  ALT 44 38  ALKPHOS 402* 366*  BILITOT 0.8 1.0  PROT 8.9* 8.6*  ALBUMIN 2.2* 2.2*   No results for input(s): LIPASE, AMYLASE in the last 168 hours. No results for input(s): AMMONIA in the last 168 hours. Coagulation Profile: Recent Labs  Lab 10/07/18 0329  INR 1.0   Cardiac Enzymes: No results for input(s): CKTOTAL, CKMB, CKMBINDEX, TROPONINI in the last 168 hours. BNP (last 3 results) No results for input(s): PROBNP in the last 8760 hours. HbA1C: Recent Labs    10/07/18 0329  HGBA1C 11.0*   CBG: Recent Labs  Lab 10/08/18 0757 10/08/18 1135 10/08/18 1619 10/08/18 2123 10/09/18 0748  GLUCAP 154* 215* 250* 296* 115*   Lipid Profile: Recent Labs    10/07/18 0329  CHOL 185  HDL 50  LDLCALC 122*  TRIG 63  CHOLHDL 3.7   Thyroid Function Tests: No results for input(s): TSH, T4TOTAL, FREET4, T3FREE, THYROIDAB in the last 72 hours. Anemia Panel: No results for input(s): VITAMINB12, FOLATE, FERRITIN, TIBC, IRON, RETICCTPCT in the last 72 hours. Urine analysis: No results found for: COLORURINE, APPEARANCEUR, LABSPEC, PHURINE, GLUCOSEU, HGBUR, BILIRUBINUR, KETONESUR, PROTEINUR, UROBILINOGEN, NITRITE,  LEUKOCYTESUR   Ripudeep Rai M.D. [facility] 10/09/2018, 9:58 AM  Pager: [phone] Between 7am to 7pm - call Pager - [phone]  After 7pm go to [email or web address] - password [email or web address]  Call night coverage person covering after 7pm

## 2018-10-09 NOTE — Progress Notes (Signed)
[facility]  Date of Admission:  10/07/2018     Total days of antibiotics 25         ASSESSMENT/PLAN  Yolanda [name] has MRSA bacteremia and osteomyelitis of the right foot and ankle with Charcot foot due to diabetes. PICC line removed yesterday. Feeling well today. Will check repeat blood cultures. TTE with areas of concern on mitral and aortic valves with inability to rule out endocarditis with heart function preserved. Hold on all central lines until blood cultures are cleared for at least 48 hours. Disposition to be determined but will require at least 6 weeks of IV antibiotics. Will continue vancomycin for now.  1. Continue current dose of vancomycin. 2. Obtain repeat blood cultures. 3. Hold on PICC placement until repeat cultures are clear for at least 48 hours. 4. Will work to obtain previous plans from wound care provider.   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 amLODipine  5 mg Oral Daily  . Chlorhexidine Gluconate Cloth  6 each Topical Q0600  . feeding supplement (PRO-STAT SUGAR FREE 64)  30 mL Oral BID  . heparin injection (subcutaneous)  5,000 Units Subcutaneous Q8H  . insulin aspart  0-5 Units Subcutaneous QHS  . insulin aspart  0-9 Units Subcutaneous TID WC  . insulin aspart  3 Units Subcutaneous TID WC  . insulin glargine  40 Units Subcutaneous BID  . metoprolol tartrate  25 mg Oral BID  . mupirocin ointment  1 application Nasal BID  . zinc sulfate  220 mg Oral Daily    SUBJECTIVE:  Afebrile overnight with no acute events. PICC line removed yesterday without complication. TTE with questionable  thickening and inability to rule out vegetation on mitral and aortic valve with good function.  Doing okay. Ready to go home when she is able.   No Known Allergies   Review of Systems: Review of Systems  Constitutional: Negative for chills, fever and weight loss.  Respiratory: Negative for cough, shortness of breath and wheezing.   Cardiovascular: Negative for chest pain and leg swelling.  Gastrointestinal: Negative for abdominal pain, constipation, diarrhea, nausea and vomiting.  Skin: Negative for rash.      OBJECTIVE: Vitals:   10/08/18 1537 10/08/18 1937 10/09/18 0350 10/09/18 0958  BP: (!) 150/98 (!) 146/97 (!) 155/102 (!) 145/100  Pulse: (!) 104 (!) 104 99 (!) 107  Resp: Temp: 98.5 F (36.9 C) 98.1 F (36.7 C) 98.2 F (36.8 C) 97.9 F (36.6 C)  TempSrc: Oral Oral Oral Oral  SpO2: 100% 100% 98% 100%  Weight:      Height:       Body mass index is 27.44 kg/m.  Physical Exam Constitutional:      General: She is not in acute distress.    Appearance: She is well-developed.     Comments: Seated in the chair; pleasant. Cam walker on right foot.   Cardiovascular:     Rate and Rhythm: Normal rate and regular rhythm.     Heart sounds: Normal heart sounds.  Pulmonary:     Effort: Pulmonary effort is normal.  Breath sounds: Normal breath sounds.  Skin:    General: Skin is warm and dry.  Neurological:     Mental Status: She is alert and oriented to person, place, and time.  Psychiatric:        Mood and Affect: Mood normal.     Lab Results Lab Results  Component Value Date   WBC 5.4 10/09/2018   HGB 10.0 (L) 10/09/2018   HCT 31.8 (L) 10/09/2018   MCV 92.4 10/09/2018   PLT 215 10/09/2018    Lab Results  Component Value Date   CREATININE 0.59 10/09/2018   BUN 8 10/09/2018   NA 142 10/09/2018   K 3.2 (L) 10/09/2018   CL 109 10/09/2018   CO2 25 10/09/2018    Lab Results  Component Value Date   ALT 38 10/09/2018   AST 38 10/09/2018    ALKPHOS 366 (H) 10/09/2018   BILITOT 1.0 10/09/2018     Microbiology: Recent Results (from the past 240 hour(s))  Culture, blood (routine x 2)     Status: None (Preliminary result)   Collection Time: 10/07/18  3:20 AM  Result Value Ref Range Status   Specimen Description BLOOD RIGHT ARM  Final   Special Requests   Final    BOTTLES DRAWN AEROBIC AND ANAEROBIC Blood Culture results may not be optimal due to an excessive volume of blood received in culture bottles   Culture   Final    NO GROWTH 2 DAYS Performed at [facility], [address], [place], [place] [address]    Report Status PENDING  Incomplete  Culture, blood (routine x 2)     Status: Abnormal (Preliminary result)   Collection Time: 10/07/18  3:29 AM  Result Value Ref Range Status   Specimen Description BLOOD RIGHT HAND  Final   Special Requests   Final    BOTTLES DRAWN AEROBIC ONLY Blood Culture adequate volume   Culture  Setup Time   Final    GRAM POSITIVE COCCI IN CLUSTERS AEROBIC BOTTLE ONLY CRITICAL RESULT CALLED TO, READ BACK BY AND VERIFIED WITH: PHARMD AMELIE SAINTCLAIR AT 1038 ON 10/08/2018 BY MHOUEGNIFIO    Culture (A)  Final    STAPHYLOCOCCUS AUREUS SUSCEPTIBILITIES TO FOLLOW Performed at [facility], [address], [place], [place] [address]    Report Status PENDING  Incomplete  Blood Culture ID Panel (Reflexed)     Status: Abnormal   Collection Time: 10/07/18  3:29 AM  Result Value Ref Range Status   Enterococcus species NOT DETECTED NOT DETECTED Final   Listeria monocytogenes NOT DETECTED NOT DETECTED Final   Staphylococcus species DETECTED (A) NOT DETECTED Final    Comment: CRITICAL RESULT CALLED TO, READ BACK BY AND VERIFIED WITH: PHARMD AMELIE SAINTCLAIR AT 1038 ON 10/08/2018 BY MHOUEGNIFIO    Staphylococcus aureus (BCID) DETECTED (A) NOT DETECTED Final    Comment: Methicillin (oxacillin)-resistant Staphylococcus aureus (MRSA). MRSA is predictably resistant to beta-lactam antibiotics  (except ceftaroline). Preferred therapy is vancomycin unless clinically contraindicated. Patient requires contact precautions if  hospitalized. CRITICAL RESULT CALLED TO, READ BACK BY AND VERIFIED WITH: PHARMD AMELIE SAINTCLAIR AT 1038 ON 10/08/2018 BY MHOUEGNIFIO    Methicillin resistance DETECTED (A) NOT DETECTED Final    Comment: CRITICAL RESULT CALLED TO, READ BACK BY AND VERIFIED WITH: PHARMD AMELIE SAINTCLAIR AT 1038 ON 10/08/2018 BY MHOUEGNIFIO    Streptococcus species NOT DETECTED NOT DETECTED Final   Streptococcus agalactiae NOT DETECTED NOT DETECTED Final   Streptococcus pneumoniae NOT  DETECTED NOT DETECTED Final   Streptococcus pyogenes NOT DETECTED NOT DETECTED Final   Acinetobacter baumannii NOT DETECTED NOT DETECTED Final   Enterobacteriaceae species NOT DETECTED NOT DETECTED Final   Enterobacter cloacae complex NOT DETECTED NOT DETECTED Final   Escherichia coli NOT DETECTED NOT DETECTED Final   Klebsiella oxytoca NOT DETECTED NOT DETECTED Final   Klebsiella pneumoniae NOT DETECTED NOT DETECTED Final   Proteus species NOT DETECTED NOT DETECTED Final   Serratia marcescens NOT DETECTED NOT DETECTED Final   Haemophilus influenzae NOT DETECTED NOT DETECTED Final   Neisseria meningitidis NOT DETECTED NOT DETECTED Final   Pseudomonas aeruginosa NOT DETECTED NOT DETECTED Final   Candida albicans NOT DETECTED NOT DETECTED Final   Candida glabrata NOT DETECTED NOT DETECTED Final   Candida krusei NOT DETECTED NOT DETECTED Final   Candida parapsilosis NOT DETECTED NOT DETECTED Final   Candida tropicalis NOT DETECTED NOT DETECTED Final    Comment: Performed at [facility], [address] [place], [place] [address]  MRSA PCR Screening     Status: Abnormal   Collection Time: 10/07/18  5:34 AM  Result Value Ref Range Status   MRSA by PCR POSITIVE (A) NEGATIVE Final    Comment:        The GeneXpert MRSA Assay (FDA approved for NASAL specimens only), is one component of a  comprehensive MRSA colonization surveillance program. It is not intended to diagnose MRSA infection nor to guide or monitor treatment for MRSA infections. RESULT CALLED TO, READ BACK BY AND VERIFIED WITH: B. DAVIS RN, AT [phone] 10/07/18 BY Renato Shin Performed at [facility], [address], [place], [place] [address]   Urine Culture     Status: Abnormal   Collection Time: 10/07/18 10:30 AM  Result Value Ref Range Status   Specimen Description URINE, RANDOM  Final   Special Requests NONE  Final   Culture (A)  Final    <10,000 COLONIES/mL INSIGNIFICANT GROWTH Performed at [facility], [address], [place], [place] [address]    Report Status 10/08/2018 FINAL  Final     Marcos Eke, NP [facility] [phone] Pager  10/09/2018  10:30 AM

## 2018-10-09 NOTE — Progress Notes (Signed)
Physical Therapy Treatment Patient Details Name: Yolanda [name] MRN: [id number] DOB: Nov 21, 1975 Today's Date: 10/09/2018    History of Present Illness Yolanda [name] is a 43 y.o. female admitted for Diabetic foot ulcer with osteomyelitis. She has PMH of  uncontrolled diabetic  neuropathy who presents with a chronic wound/ulcer over the lateral aspect of the right leg.  Patient has been undergoing wound care and treatment for several months. She is NWB on Rt leg unless has boot.    PT Comments    Patient seen for mobility progression. Pt requires supervision/min guard for safety with functional transfers and gait training. Pt does need cues for maintaining NWB status especially with transfers. Current plan remains appropriate.    Follow Up Recommendations  Home health PT;Supervision for mobility/OOB     Equipment Recommendations  3in1 (PT)    Recommendations for Other Services       Precautions / Restrictions Precautions Precautions: Fall Restrictions Weight Bearing Restrictions: Yes RLE Weight Bearing: Non weight bearing    Mobility  Bed Mobility Overal bed mobility: Modified Independent             General bed mobility comments: HOB elevated  Transfers Overall transfer level: Needs assistance Equipment used: Rolling walker (2 wheeled) Transfers: Sit to/from Stand Sit to Stand: Supervision         General transfer comment: cues each trial to maintain R LE NWB status, hand placement and technique  Ambulation/Gait Ambulation/Gait assistance: Min guard;Supervision Gait Distance (Feet): (40 ft X 2 trials with seated rest break) Assistive device: Rolling walker (2 wheeled) Gait Pattern/deviations: Step-to pattern Gait velocity: decreased   General Gait Details: cues for maintaining R LE NWB status and safe use of AD   Stairs             Wheelchair Mobility    Modified Rankin (Stroke Patients Only)       Balance Overall balance assessment: Needs  assistance   Sitting balance-Leahy Scale: Good       Standing balance-Leahy Scale: Poor Standing balance comment: needs UE support due to WB status                            Cognition Arousal/Alertness: Awake/alert Behavior During Therapy: WFL for tasks assessed/performed Overall Cognitive Status: No family/caregiver present to determine baseline cognitive functioning Area of Impairment: Safety/judgement;Problem solving                         Safety/Judgement: Decreased awareness of deficits;Decreased awareness of safety   Problem Solving: Slow processing;Requires verbal cues;Difficulty sequencing General Comments: pt is likely at baseline but does demonstrate a lack of insight into deficits and nees cues to maintain precautions as they pertain to functional mobility       Exercises      General Comments        Pertinent Vitals/Pain Pain Assessment: No/denies pain    Home Living                      Prior Function            PT Goals (current goals can now be found in the care plan section) Acute Rehab PT Goals Patient Stated Goal: return home Progress towards PT goals: Progressing toward goals    Frequency    Min 3X/week      PT Plan Current plan remains appropriate    Co-evaluation  AM-PAC PT "6 Clicks" Mobility   Outcome Measure  Help needed turning from your back to your side while in a flat bed without using bedrails?: None Help needed moving from lying on your back to sitting on the side of a flat bed without using bedrails?: None Help needed moving to and from a bed to a chair (including a wheelchair)?: A Little Help needed standing up from a chair using your arms (e.g., wheelchair or bedside chair)?: None Help needed to walk in hospital room?: A Little Help needed climbing 3-5 steps with a railing? : A Lot 6 Click Score: 20    End of Session Equipment Utilized During Treatment: Gait  belt Activity Tolerance: Patient tolerated treatment well Patient left: with call bell/phone within reach;in chair Nurse Communication: Mobility status PT Visit Diagnosis: Unsteadiness on feet (R26.81);Other abnormalities of gait and mobility (R26.89);Muscle weakness (generalized) (M62.81);Difficulty in walking, not elsewhere classified (R26.2);Pain Pain - Right/Left: Right Pain - part of body: Ankle and joints of foot     Time: 3435-6861 PT Time Calculation (min) (ACUTE ONLY): 40 min  Charges:  $Gait Training: 23-37 mins $Therapeutic Activity: 8-22 mins                     Erline Levine, PTA Acute Rehabilitation Services Pager: [phone] Office: [phone]     Carolynne Edouard 10/09/2018, 9:35 AM

## 2018-10-09 NOTE — Progress Notes (Signed)
Pharmacy Antibiotic Note  Yolanda [name] is a 43 y.o. female admitted on 10/07/2018 with MRSA bacteremia.   Pharmacy has been consulted for vancomycin dosing. Patient has previously been on daptomycin from 09/14/18 that she was receiving at an outpatinet infusion center. She has had a PICC line in for several weeks and repeat blood cultures from 3/29 are persistently positive for MRSA.   Vancomycin resumed 3/30. Dosing was based on SCr 0.8.  Patient has improved SCr of 0.6 and stably x3 days.  Good UOP but total amount unmeasured.   Plan: Increase Vancomycin 1250 mg Q 12 hours -Goal AUC 400-550. Predicted AUC 454 with SCr- 0.6 Monitor blood cultures, renal function and levels at steady state    Height: 5\' 6"  (167.6 cm) Weight: 170 lb (77.1 kg) IBW/kg (Calculated) : 59.3  Temp (24hrs), Avg:98.1 F (36.7 C), Min:97.7 F (36.5 C), Max:98.5 F (36.9 C)  Recent Labs  Lab 10/07/18 0329 10/08/18 0258 10/09/18 0616  WBC  --  5.5 5.4  CREATININE 0.70 0.63 0.59    Estimated Creatinine Clearance: 96 mL/min (by C-G formula based on SCr of 0.59 mg/dL).    No Known Allergies  Antimicrobials this admission: 3/29 Vanc>> 3.29 Zosyn >>3/30 3/30 Dapto X 1     Microbiology results: 3/29 BCx: 1/4 MRSA   Thank you for allowing pharmacy to be a part of this patient's care.  Link Snuffer, PharmD, BCPS, BCCCP Clinical Pharmacist Please refer to [facility] for [facility] numbers 10/09/2018 8:28 AM

## 2018-10-09 NOTE — Progress Notes (Addendum)
Initial Nutrition Assessment  DOCUMENTATION CODES:   Not applicable  INTERVENTION:    Continue 30 ml Prostat BID, each supplement provides 100 kcals and 15 grams protein.   MVI daily  NUTRITION DIAGNOSIS:   Increased nutrient needs related to wound healing as evidenced by estimated needs.  GOAL:   Patient will meet greater than or equal to 90% of their needs  MONITOR:   PO intake, Supplement acceptance, Diet advancement, I & O's, Weight trends, Skin  REASON FOR ASSESSMENT:   Consult Diet education  ASSESSMENT:   Patient with PMH significant for uncontrolled DM, diabetic foot ulcer, and cellulitis with MRSA bacteremia (has PICC from outside facility). Presents this admission with osteomyelitis of right foot and ankle (currently treated with wound VAC therapy in [place]).    RD working remotely.  Pt denies loss in appetite or unintentional wt loss PTA. States she typically eats three meals daily that consist of chinese food, tacos, and meats with vegetables. She claims to drink excessive amount of soda and sweet tea. She is compliant with her insulin regimen.   RD provided "Carbohydrate Counting for People with Diabetes" handout from the Academy of Nutrition and Dietetics in discharge instructions. Discussed different food groups and their effects on blood sugar, emphasizing carbohydrate-containing foods. Provided list of carbohydrates and recommended serving sizes of common foods.  Discussed importance of controlled and consistent carbohydrate intake throughout the day. Provided examples of ways to balance meals/snacks and encouraged intake of high-fiber, whole grain complex carbohydrates. Discussed beverage alternatives pt could use at home.  Recommend continued education as outpatient in [place].   Unable to complete Nutrition-Focused physical exam at this time.   Medications reviewed and include: SS novolog, lantus, zinc sulfate Labs reviewed: K 3.2 (L) CBG 115-296 A1C 11  (H)  Diet Order:   Diet Order            Diet NPO time specified  Diet effective now        Diet Carb Modified              EDUCATION NEEDS:   Education needs have been addressed  Skin:  Skin Assessment: Skin Integrity Issues: Skin Integrity Issues:: Incisions Incisions: right ankle  Last BM:  3/29  Height:   Ht Readings from Last 1 Encounters:  10/07/18 5\' 6"  (1.676 m)    Weight:   Wt Readings from Last 1 Encounters:  10/07/18 77.1 kg    Ideal Body Weight:  59.1 kg  BMI:  Body mass index is 27.44 kg/m.  Estimated Nutritional Needs:   Kcal:  1700-1900 kcal  Protein:  85-90 grams  Fluid:  >/= 1.7 L/day   Vanessa Kick RD, LDN Clinical Nutrition Pager # - [phone]

## 2018-10-09 NOTE — Plan of Care (Signed)
  Problem: Education: Goal: Knowledge of General Education information will improve Description: Including pain rating scale, medication(s)/side effects and non-pharmacologic comfort measures Outcome: Progressing   Problem: Health Behavior/Discharge Planning: Goal: Ability to manage health-related needs will improve Outcome: Progressing   Problem: Activity: Goal: Risk for activity intolerance will decrease Outcome: Progressing   

## 2018-10-09 NOTE — Progress Notes (Signed)
Patient ID: Yolanda [name], female   DOB: 1976/01/13, 43 y.o.   MRN: [id number] Patient is wearing the medical compression stockings she has no complaints there are no wrinkles in the sock.  Patient will continue her antibiotics for the bacteremia she will continue wearing the 2 layer compression sock.  2 layers during the day 1 layer at night.  I will follow-up in the office on Monday.

## 2018-10-09 NOTE — Progress Notes (Signed)
Inpatient Diabetes Program Recommendations  AACE/ADA: New Consensus Statement on Inpatient Glycemic Control (2015)  Target Ranges:  Prepandial:   less than 140 mg/dL      Peak postprandial:   less than 180 mg/dL (1-2 hours)      Critically ill patients:  140 - 180 mg/dL   Lab Results  Component Value Date   GLUCAP 178 (H) 10/09/2018   HGBA1C 11.0 (H) 10/07/2018    Review of Glycemic Control  Diabetes history: DM2   Outpatient Diabetes medications: Basaglar 50 units BID   Current orders for Inpatient glycemic control: Novolog 3 units tid meal coverage                                                                          Novolog (0-9 units) tid and (0-5 units) hs                                                                          lantus 40 units BID   Met with patient to discuss her Hgb A1c of 11% (average 269 mg/dl). She did tell me the last Hgb A1c she remembered from a year ago was 16%. Explained what an A1c is and what it measures. Reminded patient that  goal A1c is 7% or less per ADA standards to prevent both acute and long-term complications. Emphasized to patient that she will heal better with blood glucose closer to goal.  Explained to patient the extreme importance of good glucose control at home. Encouraged patient to check CBGs at least bid at home. She stated she has a working glucometer and does check it twice a day. Encouraged her to  record all CBGs in a logbook to review with PCP at appointments. She states she does have a MD in [place] who follows her care/orders insulin. States she does not have trouble obtaining her meds.   She said she drinks Dr. Malachi Bonds and sweet tea and sometimes water. I encouraged more water and unsweetened drinks. Reviewed "living well with diabetes book" and answered her questions.   -- Will follow during hospitalization.--  Jonna Clark RN, MSN Diabetes Coordinator Inpatient Glycemic Control Team Team Pager: [phone]  (8am-5pm)

## 2018-10-10 DIAGNOSIS — R945 Abnormal results of liver function studies: Secondary | ICD-10-CM

## 2018-10-10 DIAGNOSIS — X58XXXS Exposure to other specified factors, sequela: Secondary | ICD-10-CM

## 2018-10-10 DIAGNOSIS — R7989 Other specified abnormal findings of blood chemistry: Secondary | ICD-10-CM

## 2018-10-10 LAB — COMPREHENSIVE METABOLIC PANEL
ALT: 37 U/L (ref 0–44)
AST: 36 U/L (ref 15–41)
Albumin: 2.3 g/dL — ABNORMAL LOW (ref 3.5–5.0)
Alkaline Phosphatase: 365 U/L — ABNORMAL HIGH (ref 38–126)
Anion gap: 6 (ref 5–15)
BUN: 9 mg/dL (ref 6–20)
CO2: 25 mmol/L (ref 22–32)
Calcium: 9.2 mg/dL (ref 8.9–10.3)
Chloride: 107 mmol/L (ref 98–111)
Creatinine, Ser: 0.61 mg/dL (ref 0.44–1.00)
GFR calc Af Amer: >60 mL/min (ref >60)
GFR calc non Af Amer: >60 mL/min (ref >60)
Glucose, Bld: 167 mg/dL — ABNORMAL HIGH (ref 70–99)
Potassium: 3.6 mmol/L (ref 3.5–5.1)
Sodium: 138 mmol/L (ref 135–145)
Total Bilirubin: 1.2 mg/dL (ref 0.3–1.2)
Total Protein: 8.9 g/dL — ABNORMAL HIGH (ref 6.5–8.1)

## 2018-10-10 LAB — GLUCOSE, CAPILLARY
Glucose-Capillary: 94 mg/dL (ref 70–99)
Glucose-Capillary: 197 mg/dL — ABNORMAL HIGH (ref 70–99)
Glucose-Capillary: 200 mg/dL — ABNORMAL HIGH (ref 70–99)
Glucose-Capillary: 213 mg/dL — ABNORMAL HIGH (ref 70–99)

## 2018-10-10 LAB — CBC
HCT: 30.9 % — ABNORMAL LOW (ref 36.0–46.0)
Hemoglobin: 9.8 g/dL — ABNORMAL LOW (ref 12.0–15.0)
MCH: 29.6 pg (ref 26.0–34.0)
MCHC: 31.7 g/dL (ref 30.0–36.0)
MCV: 93.4 fL (ref 80.0–100.0)
Platelets: 234 10*3/uL (ref 150–400)
RBC: 3.31 MIL/uL — ABNORMAL LOW (ref 3.87–5.11)
RDW: 13.3 % (ref 11.5–15.5)
WBC: 5.5 10*3/uL (ref 4.0–10.5)
nRBC: 0 % (ref 0.0–0.2)

## 2018-10-10 LAB — CULTURE, BLOOD (ROUTINE X 2): SPECIAL REQUESTS: ADEQUATE

## 2018-10-10 LAB — GAMMA GT: GGT: 384 U/L — ABNORMAL HIGH (ref 7–50)

## 2018-10-10 MED ORDER — HYDROXYZINE HCL 25 MG PO TABS
25.0000 mg | ORAL_TABLET | Freq: Once | ORAL | Status: AC
  Administered 2018-10-10: 25 mg via ORAL
  Filled 2018-10-10: qty 1

## 2018-10-10 NOTE — Progress Notes (Signed)
[facility]  Date of Admission:  10/07/2018     Total days of antibiotics 26         ASSESSMENT/PLAN  Yolanda [name] has MRSA bacteremia and osteomyelitis of the right foot and ankle with Charcot foot due to complications of diabetes. Repeat blood cultures were drawn and are without growth in <24 hours. Will continue to hold on PICC placement until cultures are cleared through 48-72 hours. She will need 6 weeks of antimicrobial treatment for the osteomyelitis and questionable endocarditis. Disposition is likely to home. She indicates her family may be able to help administer vancomycin twice daily as we have concern regarding using Daptomycin given recent bacteremia.   1. Continue current dose of vancomycin. 2. Monitor cultures, renal function, and vancomycin levels. 3. Wound care per Dr. Lajoyce Corners.  4. Hold on PICC line until Friday pending culture results.   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 amLODipine  5 mg Oral Daily  . Chlorhexidine Gluconate Cloth  6 each Topical Q0600  . feeding supplement (PRO-STAT SUGAR FREE 64)  30 mL Oral BID  . heparin injection (subcutaneous)  5,000 Units Subcutaneous Q8H  . insulin aspart  0-5 Units Subcutaneous QHS  . insulin aspart  0-9 Units Subcutaneous TID WC  . insulin aspart  3 Units Subcutaneous TID WC  . insulin glargine  40 Units Subcutaneous BID  . metoprolol tartrate  25 mg Oral BID  . multivitamin with minerals  1 tablet Oral Daily  . mupirocin ointment  1 application Nasal BID  . zinc sulfate  220 mg Oral Daily    SUBJECTIVE:  Afebrile overnight with no acute events. Doing  well this morning and eager to go home when able.   No Known Allergies   Review of Systems: Review of Systems  Constitutional: Negative for chills, fever and weight loss.  Respiratory: Negative for cough, shortness of breath and wheezing.   Cardiovascular: Negative for chest pain and leg swelling.  Gastrointestinal: Negative for abdominal pain, constipation, diarrhea, nausea and vomiting.  Skin: Negative for rash.      OBJECTIVE: Vitals:   10/09/18 1245 10/09/18 1646 10/09/18 1932 10/10/18 0416  BP: (!) 146/102 (!) 122/98 (!) 154/98 (!) 137/98  Pulse:  (!) 110 (!) 103 96  Resp:   14 16  Temp:   98.9 F (37.2 C) 98.5 F (36.9 C)  TempSrc:   Oral Oral  SpO2:   99% 98%  Weight:      Height:       Body mass index is 27.44 kg/m.  Physical Exam Constitutional:      General: She is not in acute distress.    Appearance: She is well-developed.  Cardiovascular:     Rate and Rhythm: Normal rate and regular rhythm.     Heart sounds: Normal heart sounds.  Pulmonary:     Effort: Pulmonary effort is normal.     Breath sounds: Normal breath sounds.  Musculoskeletal:     Comments: Wound on right leg appears clean and dry with good granulation tissue. Continues to wear compression sock.   Skin:    General: Skin is  warm and dry.  Neurological:     Mental Status: She is alert.  Psychiatric:        Mood and Affect: Mood normal.     Lab Results Lab Results  Component Value Date   WBC 5.5 10/10/2018   HGB 9.8 (L) 10/10/2018   HCT 30.9 (L) 10/10/2018   MCV 93.4 10/10/2018   PLT 234 10/10/2018    Lab Results  Component Value Date   CREATININE 0.61 10/10/2018   BUN 9 10/10/2018   NA 138 10/10/2018   K 3.6 10/10/2018   CL 107 10/10/2018   CO2 25 10/10/2018    Lab Results  Component Value Date   ALT 37 10/10/2018   AST 36 10/10/2018   ALKPHOS 365 (H) 10/10/2018   BILITOT 1.2 10/10/2018     Microbiology: Recent Results (from the past 240 hour(s))  Culture, blood  (routine x 2)     Status: None (Preliminary result)   Collection Time: 10/07/18  3:20 AM  Result Value Ref Range Status   Specimen Description BLOOD RIGHT ARM  Final   Special Requests   Final    BOTTLES DRAWN AEROBIC AND ANAEROBIC Blood Culture results may not be optimal due to an excessive volume of blood received in culture bottles   Culture   Final    NO GROWTH 3 DAYS Performed at [facility], [address] [place], [place] [address]    Report Status PENDING  Incomplete  Culture, blood (routine x 2)     Status: Abnormal   Collection Time: 10/07/18  3:29 AM  Result Value Ref Range Status   Specimen Description BLOOD RIGHT HAND  Final   Special Requests   Final    BOTTLES DRAWN AEROBIC ONLY Blood Culture adequate volume   Culture  Setup Time   Final    GRAM POSITIVE COCCI IN CLUSTERS AEROBIC BOTTLE ONLY CRITICAL RESULT CALLED TO, READ BACK BY AND VERIFIED WITH: PHARMD AMELIE SAINTCLAIR AT 1038 ON 10/08/2018 BY MHOUEGNIFIO Performed at [facility], [address] [place], [place] [address]    Culture METHICILLIN RESISTANT STAPHYLOCOCCUS AUREUS (A)  Final   Report Status 10/10/2018 FINAL  Final   Organism ID, Bacteria METHICILLIN RESISTANT STAPHYLOCOCCUS AUREUS  Final      Susceptibility   Methicillin resistant staphylococcus aureus - MIC*    CIPROFLOXACIN >=8 RESISTANT Resistant     ERYTHROMYCIN >=8 RESISTANT Resistant     GENTAMICIN <=0.5 SENSITIVE Sensitive     OXACILLIN >=4 RESISTANT Resistant     TETRACYCLINE <=1 SENSITIVE Sensitive     VANCOMYCIN 1 SENSITIVE Sensitive     TRIMETH/SULFA <=10 SENSITIVE Sensitive     CLINDAMYCIN >=8 RESISTANT Resistant     RIFAMPIN <=0.5 SENSITIVE Sensitive     Inducible Clindamycin NEGATIVE Sensitive     * METHICILLIN RESISTANT STAPHYLOCOCCUS AUREUS  Blood Culture ID Panel (Reflexed)     Status: Abnormal   Collection Time: 10/07/18  3:29 AM  Result Value Ref Range Status   Enterococcus species NOT DETECTED NOT DETECTED  Final   Listeria monocytogenes NOT DETECTED NOT DETECTED Final   Staphylococcus species DETECTED (A) NOT DETECTED Final    Comment: CRITICAL RESULT CALLED TO, READ BACK BY AND VERIFIED WITH: PHARMD AMELIE SAINTCLAIR AT 1038 ON 10/08/2018 BY MHOUEGNIFIO    Staphylococcus aureus (BCID) DETECTED (A) NOT DETECTED Final    Comment: Methicillin (oxacillin)-resistant Staphylococcus aureus (MRSA). MRSA is predictably resistant to beta-lactam antibiotics (except ceftaroline). Preferred therapy is vancomycin  unless clinically contraindicated. Patient requires contact precautions if  hospitalized. CRITICAL RESULT CALLED TO, READ BACK BY AND VERIFIED WITH: PHARMD AMELIE SAINTCLAIR AT 1038 ON 10/08/2018 BY MHOUEGNIFIO    Methicillin resistance DETECTED (A) NOT DETECTED Final    Comment: CRITICAL RESULT CALLED TO, READ BACK BY AND VERIFIED WITH: PHARMD AMELIE SAINTCLAIR AT 1038 ON 10/08/2018 BY MHOUEGNIFIO    Streptococcus species NOT DETECTED NOT DETECTED Final   Streptococcus agalactiae NOT DETECTED NOT DETECTED Final   Streptococcus pneumoniae NOT DETECTED NOT DETECTED Final   Streptococcus pyogenes NOT DETECTED NOT DETECTED Final   Acinetobacter baumannii NOT DETECTED NOT DETECTED Final   Enterobacteriaceae species NOT DETECTED NOT DETECTED Final   Enterobacter cloacae complex NOT DETECTED NOT DETECTED Final   Escherichia coli NOT DETECTED NOT DETECTED Final   Klebsiella oxytoca NOT DETECTED NOT DETECTED Final   Klebsiella pneumoniae NOT DETECTED NOT DETECTED Final   Proteus species NOT DETECTED NOT DETECTED Final   Serratia marcescens NOT DETECTED NOT DETECTED Final   Haemophilus influenzae NOT DETECTED NOT DETECTED Final   Neisseria meningitidis NOT DETECTED NOT DETECTED Final   Pseudomonas aeruginosa NOT DETECTED NOT DETECTED Final   Candida albicans NOT DETECTED NOT DETECTED Final   Candida glabrata NOT DETECTED NOT DETECTED Final   Candida krusei NOT DETECTED NOT DETECTED Final   Candida  parapsilosis NOT DETECTED NOT DETECTED Final   Candida tropicalis NOT DETECTED NOT DETECTED Final    Comment: Performed at [facility], [address], [place], [place] [address]  MRSA PCR Screening     Status: Abnormal   Collection Time: 10/07/18  5:34 AM  Result Value Ref Range Status   MRSA by PCR POSITIVE (A) NEGATIVE Final    Comment:        The GeneXpert MRSA Assay (FDA approved for NASAL specimens only), is one component of a comprehensive MRSA colonization surveillance program. It is not intended to diagnose MRSA infection nor to guide or monitor treatment for MRSA infections. RESULT CALLED TO, READ BACK BY AND VERIFIED WITH: B. DAVIS RN, AT [phone] 10/07/18 BY Renato Shin Performed at [facility], [address] [place], [place] [address]   Urine Culture     Status: Abnormal   Collection Time: 10/07/18 10:30 AM  Result Value Ref Range Status   Specimen Description URINE, RANDOM  Final   Special Requests NONE  Final   Culture (A)  Final    <10,000 COLONIES/mL INSIGNIFICANT GROWTH Performed at [facility], [address], [place], [place] [address]    Report Status 10/08/2018 FINAL  Final     Marcos Eke, NP [facility] [phone] Pager  10/10/2018  10:42 AM

## 2018-10-10 NOTE — Progress Notes (Signed)
Patient Demographics:    Yolanda [name], is a 43 y.o. female, DOB - 01-22-1976, HUO:[id number]  Admit date - 10/07/2018   Admitting Physician Allie Bossier, MD  Outpatient Primary MD for the patient is No primary care provider on file.  LOS - 3   No chief complaint on file.       Subjective:    Yolanda [name] today has no fevers, no emesis,  No chest pain,   No Nausea, Vomiting or Diarrhea   Assessment  & Plan :    Principal Problem:   MRSA bacteremia Active Problems:   Osteomyelitis of ankle and foot ([place])   Diabetes mellitus type 2, uncontrolled, with complications ([place])   Charcot foot due to diabetes mellitus (HCC)   Severe protein-calorie malnutrition (HCC)   Leg wound, right, sequela   Diabetic polyneuropathy associated with type 2 diabetes mellitus (HCC)   Cigarette smoker  Brief Summary  44 y.o.  BF PMHx Uncontrolled Diabetes type 2 with complication, Rt Foot/Ankle diabetic foot ulcer/Charcot's foot, osteomyelitis and Celllulitis with MRSA Bacteremia as well  4 weeks PTA treated with iv Daptomycin . Was followed by surgery at [facility] who contacted Dr Sharol Given from orthopedic surgery who agreed to see patient in consult . Pt accepted to Medsurg at [facility] . 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 Previous Lt Arm PICC line removed, --- Patient was on daptomycin at the outside facility, started on 09/14/2018, supposed to finish on 4/10 for total 6 weeks. PICC line in left arm...however, Repeat blood cultures done here from 10/07/2018+ for MRSA, repeat blood culture from 10/09/2018 NGTD, afebrile blood cultures  remain negative may replace PICC line on 10/12/2018--   2)Diabetes type 2 uncontrolled with complication--.  A1c this admission 11.0 reflecting uncontrolled DM, Use Novolog/Humalog Sliding scale insulin with Accu-Cheks/Fingersticks as ordered, continue Lantus insulin 40 units twice daily  3)HTN----continue amlodipine 5 mg daily, metoprolol 25 mg bid,  may use IV labetalol  10 mg when necessary  Every 4 hours for systolic blood pressure over 165 mmhg  4)Osteomyelitis RIGHT foot/ankle with cellulitis and Charcot Foot--- Dr. Sharol Given recommends no surgical intervention at this time, continue IV vancomycin  5)Right lower extremity DVT, diagnosed in 07/2018 -Patient was on Xarelto, completed starter pack however Xarelto was held due to bleeding issues at [facility] with debridement. -Right lower extremity venous Dopplers negative for DVT, will hold off on Xarelto  6)Elevated alk phos-----Unclear etiology, mild elevation in the direct bilirubin, daptomycin also causes abnormal LFTs, -Alk phos trending down 402 with the time of admission, 366 today - obtain right upper quadrant ultrasound, GGT, AMA, ANA, SMA  7)Severe protein calorie malnutrition--Albumin 2.2 -Placed on pro-stat, zinc daily   Disposition/Need for in-Hospital Stay- patient unable to be discharged at this time due to patient will need PICC line replaced if blood cultures are negative on 10/12/2018 and possible discharge home on IV vancomycin twice a day to be administered by home health and family members  Code Status : Full Code  Family Communication:   None at bedside   Disposition Plan  : Home with home health IV vancomycin   Consults  :  ID/ortho   DVT Prophylaxis  :   - Heparin -    Lab Results  Component Value Date   PLT 234 10/10/2018    Inpatient Medications  Scheduled Meds: . amLODipine  5 mg Oral Daily  . Chlorhexidine Gluconate Cloth  6 each Topical Q0600  . feeding supplement (PRO-STAT SUGAR FREE 64)   30 mL Oral BID  . heparin injection (subcutaneous)  5,000 Units Subcutaneous Q8H  . insulin aspart  0-5 Units Subcutaneous QHS  . insulin aspart  0-9 Units Subcutaneous TID WC  . insulin aspart  3 Units Subcutaneous TID WC  . insulin glargine  40 Units Subcutaneous BID  . metoprolol tartrate  25 mg Oral BID  . multivitamin with minerals  1 tablet Oral Daily  . mupirocin ointment  1 application Nasal BID  . zinc sulfate  220 mg Oral Daily   Continuous Infusions: . vancomycin 1,250 mg (10/10/18 0637)   PRN Meds:.acetaminophen **OR** acetaminophen, bisacodyl, diphenhydrAMINE, ketorolac, labetalol, senna-docusate, traMADol, zolpidem    Anti-infectives (From admission, onward)   Start     Dose/Rate Route Frequency Ordered Stop   10/09/18 1800  vancomycin (VANCOCIN) 1,250 mg in sodium chloride 0.9 % 250 mL IVPB     1,250 mg 166.7 mL/hr over 90 Minutes Intravenous Every 12 hours 10/09/18 0831     10/08/18 1745  vancomycin (VANCOCIN) 1,000 mg in sodium chloride 0.9 % 250 mL IVPB  Status:  Discontinued     1,000 mg 250 mL/hr over 60 Minutes Intravenous Every 12 hours 10/08/18 1315 10/09/18 0831   10/08/18 1030  DAPTOmycin (CUBICIN) 600 mg in sodium chloride 0.9 % IVPB     600 mg 224 mL/hr over 30 Minutes Intravenous Daily 10/08/18 1026 10/08/18 1149   10/08/18 0000  daptomycin (CUBICIN) IVPB  Status:  Discontinued     600 mg Intravenous Every 24 hours 10/08/18 0839 10/08/18    10/07/18 1400  vancomycin (VANCOCIN) 1,000 mg in sodium chloride 0.9 % 250 mL IVPB  Status:  Discontinued     1,000 mg 250 mL/hr over 60 Minutes Intravenous Every 8 hours 10/07/18 0351 10/08/18 0829   10/07/18 0430  vancomycin (VANCOCIN) 1,500 mg in sodium chloride 0.9 % 500 mL IVPB     1,500 mg 250 mL/hr over 120 Minutes Intravenous  Once 10/07/18 0351 10/07/18 0835   10/07/18 0430  piperacillin-tazobactam (ZOSYN) IVPB 3.375 g  Status:  Discontinued     3.375 g 12.5 mL/hr over 240 Minutes Intravenous Every 8  hours 10/07/18 0351 10/08/18 0829        Objective:   Vitals:   10/09/18 1245 10/09/18 1646 10/09/18 1932 10/10/18 0416  BP: (!) 146/102 (!) 122/98 (!) 154/98 (!) 137/98  Pulse:  (!) 110 (!) 103 96  Resp:   14 16  Temp:   98.9 F (37.2 C) 98.5 F (36.9 C)  TempSrc:   Oral Oral  SpO2:   99% 98%  Weight:      Height:        Wt Readings from Last 3 Encounters:  10/07/18 77.1 kg     Intake/Output Summary (Last 24 hours) at 10/10/2018 1427 Last data filed at 10/10/2018 0900 Gross per 24 hour  Intake 360 ml  Output -  Net 360 ml     Physical Exam Patient is examined daily including today on  10/10/18 , exams remain the same as of yesterday except that has changed   Gen:- Awake Alert,  In no apparent distress  HEENT:- [place].AT, No sclera icterus Neck-Supple Neck,No JVD,.  Lungs-  CTAB , fair symmetrical air movement CV- S1, S2 normal, regular  Abd-  +ve B.Sounds, Abd Soft, No tenderness,    Extremity/Skin:-  pedal pulses present/-- Wound on right leg appears clean and dry with good granulation tissue. Continues to wear compression sock.   Psych-affect is appropriate, oriented x3 Neuro-no new focal deficits, no tremors   Data Review:   Micro Results Recent Results (from the past 240 hour(s))  Culture, blood (routine x 2)     Status: None (Preliminary result)   Collection Time: 10/07/18  3:20 AM  Result Value Ref Range Status   Specimen Description BLOOD RIGHT ARM  Final   Special Requests   Final    BOTTLES DRAWN AEROBIC AND ANAEROBIC Blood Culture results may not be optimal due to an excessive volume of blood received in culture bottles   Culture   Final    NO GROWTH 3 DAYS Performed at [facility], [place] [address], [place], [place] [address]    Report Status PENDING  Incomplete  Culture, blood (routine x 2)     Status: Abnormal   Collection Time: 10/07/18  3:29 AM  Result Value Ref Range Status   Specimen Description BLOOD RIGHT HAND  Final   Special Requests    Final    BOTTLES DRAWN AEROBIC ONLY Blood Culture adequate volume   Culture  Setup Time   Final    GRAM POSITIVE COCCI IN CLUSTERS AEROBIC BOTTLE ONLY CRITICAL RESULT CALLED TO, READ BACK BY AND VERIFIED WITH: PHARMD AMELIE SAINTCLAIR AT 1829 ON 10/08/2018 BY MHOUEGNIFIO Performed at [facility], [place] [address], [place], [place] [address]    Culture METHICILLIN RESISTANT STAPHYLOCOCCUS AUREUS (A)  Final   Report Status 10/10/2018 FINAL  Final   Organism ID, Bacteria METHICILLIN RESISTANT STAPHYLOCOCCUS AUREUS  Final      Susceptibility   Methicillin resistant staphylococcus aureus - MIC*    CIPROFLOXACIN >=8 RESISTANT Resistant     ERYTHROMYCIN >=8 RESISTANT Resistant     GENTAMICIN <=0.5 SENSITIVE Sensitive     OXACILLIN >=4 RESISTANT Resistant     TETRACYCLINE <=1 SENSITIVE Sensitive     VANCOMYCIN 1 SENSITIVE Sensitive     TRIMETH/SULFA <=10 SENSITIVE Sensitive     CLINDAMYCIN >=8 RESISTANT Resistant     RIFAMPIN <=0.5 SENSITIVE Sensitive     Inducible Clindamycin NEGATIVE Sensitive     * METHICILLIN RESISTANT STAPHYLOCOCCUS AUREUS  Blood Culture ID Panel (Reflexed)     Status: Abnormal   Collection Time: 10/07/18  3:29 AM  Result Value Ref Range Status   Enterococcus species NOT DETECTED NOT DETECTED Final   Listeria monocytogenes NOT DETECTED NOT DETECTED Final   Staphylococcus species DETECTED (A) NOT DETECTED Final    Comment: CRITICAL RESULT CALLED TO, READ BACK BY AND VERIFIED WITH: PHARMD AMELIE SAINTCLAIR AT 1038 ON 10/08/2018 BY MHOUEGNIFIO    Staphylococcus aureus (BCID) DETECTED (A) NOT DETECTED Final    Comment: Methicillin (oxacillin)-resistant Staphylococcus aureus (MRSA). MRSA is predictably resistant to beta-lactam antibiotics (except ceftaroline). Preferred therapy is vancomycin unless clinically contraindicated. Patient requires contact precautions if  hospitalized. CRITICAL RESULT CALLED TO, READ BACK BY AND VERIFIED WITH: PHARMD AMELIE SAINTCLAIR AT  9678 ON 10/08/2018 BY MHOUEGNIFIO    Methicillin resistance DETECTED (A) NOT DETECTED Final  Comment: CRITICAL RESULT CALLED TO, READ BACK BY AND VERIFIED WITH: PHARMD AMELIE SAINTCLAIR AT 2563 ON 10/08/2018 BY MHOUEGNIFIO    Streptococcus species NOT DETECTED NOT DETECTED Final   Streptococcus agalactiae NOT DETECTED NOT DETECTED Final   Streptococcus pneumoniae NOT DETECTED NOT DETECTED Final   Streptococcus pyogenes NOT DETECTED NOT DETECTED Final   Acinetobacter baumannii NOT DETECTED NOT DETECTED Final   Enterobacteriaceae species NOT DETECTED NOT DETECTED Final   Enterobacter cloacae complex NOT DETECTED NOT DETECTED Final   Escherichia coli NOT DETECTED NOT DETECTED Final   Klebsiella oxytoca NOT DETECTED NOT DETECTED Final   Klebsiella pneumoniae NOT DETECTED NOT DETECTED Final   Proteus species NOT DETECTED NOT DETECTED Final   Serratia marcescens NOT DETECTED NOT DETECTED Final   Haemophilus influenzae NOT DETECTED NOT DETECTED Final   Neisseria meningitidis NOT DETECTED NOT DETECTED Final   Pseudomonas aeruginosa NOT DETECTED NOT DETECTED Final   Candida albicans NOT DETECTED NOT DETECTED Final   Candida glabrata NOT DETECTED NOT DETECTED Final   Candida krusei NOT DETECTED NOT DETECTED Final   Candida parapsilosis NOT DETECTED NOT DETECTED Final   Candida tropicalis NOT DETECTED NOT DETECTED Final    Comment: Performed at [facility], [place]. [address] [place], [place] [address]  MRSA PCR Screening     Status: Abnormal   Collection Time: 10/07/18  5:34 AM  Result Value Ref Range Status   MRSA by PCR POSITIVE (A) NEGATIVE Final    Comment:        The GeneXpert MRSA Assay (FDA approved for NASAL specimens only), is one component of a comprehensive MRSA colonization surveillance program. It is not intended to diagnose MRSA infection nor to guide or monitor treatment for MRSA infections. RESULT CALLED TO, READ BACK BY AND VERIFIED WITH: B. DAVIS RN, AT [phone]  10/07/18 BY Rush Landmark Performed at [facility], [place] [address] [place], [place] [address]   Urine Culture     Status: Abnormal   Collection Time: 10/07/18 10:30 AM  Result Value Ref Range Status   Specimen Description URINE, RANDOM  Final   Special Requests NONE  Final   Culture (A)  Final    <10,000 COLONIES/mL INSIGNIFICANT GROWTH Performed at [place] [address] [place], [place] [address]    Report Status 10/08/2018 FINAL  Final  Culture, blood (routine x 2)     Status: None (Preliminary result)   Collection Time: 10/09/18 10:21 AM  Result Value Ref Range Status   Specimen Description BLOOD RIGHT ANTECUBITAL  Final   Special Requests   Final    BOTTLES DRAWN AEROBIC ONLY Blood Culture adequate volume   Culture   Final    NO GROWTH < 24 HOURS Performed at [facility], [place] [address], [place], [place] [address]    Report Status PENDING  Incomplete  Culture, blood (routine x 2)     Status: None (Preliminary result)   Collection Time: 10/09/18 10:23 AM  Result Value Ref Range Status   Specimen Description BLOOD LEFT HAND  Final   Special Requests   Final    BOTTLES DRAWN AEROBIC ONLY Blood Culture results may not be optimal due to an inadequate volume of blood received in culture bottles   Culture   Final    NO GROWTH < 24 HOURS Performed at [facility], [place] [address], [place],  [address]    Report Status PENDING  Incomplete  Radiology Reports Dg Chest 1 View  Result Date: 10/07/2018 CLINICAL DATA:  MRSA bacteremia, PICC line placement EXAM: CHEST  1 VIEW COMPARISON:  None. FINDINGS: Lungs are clear.  No pleural effusion or pneumothorax. The heart is normal in size. Left arm PICC terminates in the upper SVC, 4.5 cm above the cavoatrial junction. IMPRESSION: Left arm PICC terminates in the upper SVC, 4.5 cm above the cavoatrial junction. Electronically Signed   By: Julian Hy M.D.   On: 10/07/2018 06:05   Dg Humerus Left   Result Date: 10/07/2018 CLINICAL DATA:  MRSA bacteremia, status post PICC line placement EXAM: LEFT HUMERUS - 2+ VIEW COMPARISON:  None. FINDINGS: No fracture or dislocation is seen. The joint spaces are preserved. The visualized soft tissues are unremarkable. Visualized left lung is clear. Left arm PICC courses through the axillary region into the chest. IMPRESSION: Negative. Left arm PICC courses through the axillary region into the chest. Electronically Signed   By: Julian Hy M.D.   On: 10/07/2018 06:04   Vas [place] Lower Extremity Venous (dvt)  Result Date: 10/08/2018  Lower Venous Study Indications: Follow up DVT, off anticoagulants due to bleeding.  Performing Technologist: June Leap RDMS, RVT  Examination Guidelines: A complete evaluation includes B-mode imaging, spectral Doppler, color Doppler, and power Doppler as needed of all accessible portions of each vessel. Bilateral testing is considered an integral part of a complete examination. Limited examinations for reoccurring indications may be performed as noted.  Right Venous Findings: +---------+---------------+---------+-----------+----------+-------+          CompressibilityPhasicitySpontaneityPropertiesSummary +---------+---------------+---------+-----------+----------+-------+ CFV      Full           Yes      Yes                          +---------+---------------+---------+-----------+----------+-------+ SFJ      Full                                                 +---------+---------------+---------+-----------+----------+-------+ FV Prox  Full                                                 +---------+---------------+---------+-----------+----------+-------+ FV Mid   Full                                                 +---------+---------------+---------+-----------+----------+-------+ FV DistalFull                                                  +---------+---------------+---------+-----------+----------+-------+ PFV      Full                                                 +---------+---------------+---------+-----------+----------+-------+ POP      Full  Yes      Yes                          +---------+---------------+---------+-----------+----------+-------+ PTV      Full                                                 +---------+---------------+---------+-----------+----------+-------+ PERO     Full                                                 +---------+---------------+---------+-----------+----------+-------+  Left Venous Findings: +---+---------------+---------+-----------+----------+-------+    CompressibilityPhasicitySpontaneityPropertiesSummary +---+---------------+---------+-----------+----------+-------+ CFVFull           Yes      Yes                          +---+---------------+---------+-----------+----------+-------+    Summary: Right: There is no evidence of deep vein thrombosis in the lower extremity. No cystic structure found in the popliteal fossa. Ultrasound characteristics of enlarged lymph nodes are noted in the groin. Left: No evidence of common femoral vein obstruction. Ultrasound characteristics of enlarged lymph nodes noted in the groin.  *See table(s) above for measurements and observations. Electronically signed by Ruta Hinds MD on 10/08/2018 at 8:49:33 PM.    Final    US Abdomen Limited Ruq  Result Date: 10/09/2018 CLINICAL DATA:  Abnormal LFTs EXAM: ULTRASOUND ABDOMEN LIMITED RIGHT UPPER QUADRANT COMPARISON:  None FINDINGS: Gallbladder: Incompletely distended. Mildly thickened gallbladder wall. No shadowing calculi, pericholecystic fluid or sonographic Murphy sign. Common bile duct: Diameter: 4 mm diameter, normal Liver: Normal appearance. No mass or nodularity. Portal vein is patent on color Doppler imaging with normal direction of blood flow towards the liver. No  free fluid in RIGHT upper quadrant. IMPRESSION: Incompletely distended gallbladder, which could account for a minimally thickened gallbladder wall. Otherwise negative exam. Electronically Signed   By: Lavonia Dana M.D.   On: 10/09/2018 16:24     CBC Recent Labs  Lab 10/08/18 0258 10/09/18 0616 10/10/18 0414  WBC 5.5 5.4 5.5  HGB 10.1* 10.0* 9.8*  HCT 31.7* 31.8* 30.9*  PLT 205 215 234  MCV 94.9 92.4 93.4  MCH 30.2 29.1 29.6  MCHC 31.9 31.4 31.7  RDW 12.7 12.9 13.3    Chemistries  Recent Labs  Lab 10/07/18 0329 10/08/18 0258 10/09/18 0616 10/10/18 0414  NA 135 137 142 138  K 3.3* 3.4* 3.2* 3.6  CL 104 105 109 107  CO2 '23 25 25 25  ' GLUCOSE 328* 174* 156* 167*  BUN <5* 5* 8 9  CREATININE 0.70 0.63 0.59 0.61  CALCIUM 8.5* 8.6* 9.0 9.2  MG 1.9  --   --   --   AST 55*  --  38 36  ALT 44  --  38 37  ALKPHOS 402*  --  366* 365*  BILITOT 0.8  --  1.0 1.2   ------------------------------------------------------------------------------------------------------------------ No results for input(s): CHOL, HDL, LDLCALC, TRIG, CHOLHDL, LDLDIRECT in the last 72 hours.  Lab Results  Component Value Date   HGBA1C 11.0 (H) 10/07/2018   ------------------------------------------------------------------------------------------------------------------ No results for input(s): TSH, T4TOTAL, T3FREE, THYROIDAB in the last 72 hours.  Invalid input(s): FREET3 ------------------------------------------------------------------------------------------------------------------ No results for input(s): VITAMINB12, FOLATE, FERRITIN, TIBC, IRON, RETICCTPCT in the last 72 hours.  Coagulation profile Recent Labs  Lab 10/07/18 0329  INR 1.0    No results for input(s): DDIMER in the last 72 hours.  Cardiac Enzymes No results for input(s): CKMB, TROPONINI, MYOGLOBIN in the last 168 hours.  Invalid input(s): CK  ------------------------------------------------------------------------------------------------------------------ No results found for: BNP   Roxan Hockey M.D on 10/10/2018 at 2:27 PM  Go to [email or web address] - for contact info  [facility] - Office  [phone]

## 2018-10-10 NOTE — Progress Notes (Signed)
C/o itching unrelieved by benadryl obtained order for atarax x1 pt sleeping at this time

## 2018-10-10 NOTE — Plan of Care (Signed)

## 2018-10-11 LAB — MITOCHONDRIAL ANTIBODIES: Mitochondrial M2 Ab, IgG: <20 Units (ref 0.0–20.0)

## 2018-10-11 LAB — GLUCOSE, CAPILLARY
Glucose-Capillary: 143 mg/dL — ABNORMAL HIGH (ref 70–99)
Glucose-Capillary: 211 mg/dL — ABNORMAL HIGH (ref 70–99)
Glucose-Capillary: 273 mg/dL — ABNORMAL HIGH (ref 70–99)
Glucose-Capillary: 218 mg/dL — ABNORMAL HIGH (ref 70–99)

## 2018-10-11 LAB — ANTINUCLEAR ANTIBODIES, IFA: ANA Ab, IFA: NEGATIVE

## 2018-10-11 LAB — VANCOMYCIN, TROUGH: Vancomycin Tr: 20 ug/mL (ref 15–20)

## 2018-10-11 LAB — ANTI-SMOOTH MUSCLE ANTIBODY, IGG: F-Actin IgG: 18 Units (ref 0–19)

## 2018-10-11 LAB — VANCOMYCIN, PEAK: Vancomycin Pk: 41 ug/mL — ABNORMAL HIGH (ref 30–40)

## 2018-10-11 MED ORDER — HYDROXYZINE HCL 25 MG PO TABS
25.0000 mg | ORAL_TABLET | Freq: Once | ORAL | Status: AC
  Administered 2018-10-12: 25 mg via ORAL
  Filled 2018-10-11: qty 1

## 2018-10-11 MED ORDER — VANCOMYCIN HCL 1000 MG IV SOLR
1000.0000 mg | Freq: Two times a day (BID) | INTRAVENOUS | Status: DC
  Administered 2018-10-11 – 2018-10-15 (×8): 1000 mg via INTRAVENOUS
  Filled 2018-10-11 (×10): qty 1000

## 2018-10-11 MED ORDER — INSULIN ASPART 100 UNIT/ML ~~LOC~~ SOLN
4.0000 [IU] | Freq: Three times a day (TID) | SUBCUTANEOUS | Status: DC
  Administered 2018-10-11 – 2018-10-15 (×10): 4 [IU] via SUBCUTANEOUS

## 2018-10-11 NOTE — Progress Notes (Signed)
Results for SHIARA, CLOYD (MRN [id number]) as of 10/11/2018 12:37  Ref. Range 10/10/2018 11:29 10/10/2018 16:34 10/10/2018 21:36 10/11/2018 07:23 10/11/2018 11:37  Glucose-Capillary Latest Ref Range: 70 - 99 mg/dL 829 (H) 937 (H) 169 (H) 143 (H) 211 (H)  Noted that blood sugars have been greater than 180 mg/dl. If blood sugars continue to be elevated, recommend increasing Novolog meal coverage to 4 units TID if eating at least 50% of meals.   Smith Mince RN BSN CDE Diabetes Coordinator Pager: [phone]  8am-5pm

## 2018-10-11 NOTE — Consult Note (Signed)
WOC Nurse wound consult note Reason for Consult: Nonhealing owund to right lateral lower leg.  Right foot has charcot deformity.  Seen by Dr Lajoyce Corners and has been using antimicrobial removable compression garment at home.  Dr Lajoyce Corners has evaluated and recommended no surgery.  Is on Vancomycin.  Will continue this therapeutic modality as recommended by her outpatient orthopedic physician.  Wound type:Nonhealing neuropathic wounds to right lower extremity.  Pressure Injury POA: NA Measurement:Right lateral leg:  16 cm x 2 cm x 0.3 cm  Wound bed:10% yellow devitalized tissue  Otherwise pale pink nongranulating tissue.  Charcot deformity to right foot.  Suspected source of infection.  Drainage (amount, consistency, odor) minimal serosanguinous.  Absorptive antimicrobial compression garment in place.  Periwound:edema Dressing procedure/placement/frequency: Continue compression garment.  Follow up with Dr Lajoyce Corners and continue IV antibiotic coverage for osteomyelitis.  Will not follow at this time.  Please re-consult if needed.  Maple Hudson MSN, RN, FNP-BC CWON Wound, Ostomy, Continence Nurse Pager [phone]

## 2018-10-11 NOTE — Progress Notes (Signed)
Patient Demographics:    Yolanda [name], is a 43 y.o. female, DOB - [birth year], PHK:[id number]  Admit date - 10/07/2018   Admitting Physician Allie Bossier, MD  Outpatient Primary MD for the patient is No primary care provider on file.  LOS - 4   No chief complaint on file.       Subjective:    Yolanda [name] today has no fevers, no emesis,  No chest pain, no new complaints, blood sugars running somewhat higher  Assessment  & Plan :    Principal Problem:   MRSA bacteremia Active Problems:   Osteomyelitis of ankle and foot (HCC)   Diabetes mellitus type 2, uncontrolled, with complications (HCC)   Charcot foot due to diabetes mellitus (HCC)   Severe protein-calorie malnutrition (HCC)   Leg wound, right, sequela   Diabetic polyneuropathy associated with type 2 diabetes mellitus (HCC)   Cigarette smoker   Abnormal LFTs  Brief Summary  43 y.o.  BF PMHx Uncontrolled Diabetes type 2 with complication, Rt Foot/Ankle diabetic foot ulcer/Charcot's foot, osteomyelitis and Celllulitis with MRSA Bacteremia as well  4 weeks PTA treated with iv Daptomycin . Was followed by surgery at [facility] who contacted Dr Sharol Given from orthopedic surgery who agreed to see patient in consult .  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 Failed Daptomycin (blood cultures remain positive despite more than 3 weeks of IV daptomycin PTA,  TTE with suspicion of endocarditis, cardiology reluctant to do TEE given current COVID-19 situation bacteremia persisted despite IV daptomycin, ID recommends IV vancomycin for 6 weeks from first negative culture (10/09/18)...... Previous Lt Arm PICC line removed, --- Patient was on daptomycin at the outside facility, started on 09/14/2018, supposed to finish on 4/10 for total 6 weeks. PICC line in left arm Removed due to +ve  Repeat blood cultures done here from 10/07/2018+ for MRSA, repeat blood culture from 10/09/2018 NGTD, afebrile blood cultures remain negative may replace PICC line on 10/15/2018--..the patient will Need-IV vancomycin twice a day at short stay at [facility], they will need order to continue IV antibiotics and any medication changes. Fax number is [phone]   2)Diabetes type 2 uncontrolled with complication--.  A1c this admission 11.0 reflecting uncontrolled DM, Use Novolog/Humalog Sliding scale insulin with Accu-Cheks/Fingersticks as ordered, continue Lantus insulin 40 units twice daily, change NovoLog insulin to 4 units 3 times daily with meals  3)HTN----stable, c/n Amlodipine 5 mg daily, metoprolol 25 mg bid,  may use IV labetalol  10 mg when necessary  Every 4 hours for systolic blood pressure over 165 mmhg  4)Osteomyelitis RIGHT foot/ankle with cellulitis and Charcot Foot--- Dr. Sharol Given recommends no surgical intervention at this time, continue IV vancomycin , plan is for PICC line placement on 10/15/2018 if negative Repeat blood cultures  5)Right lower extremity DVT, diagnosed in 07/2018 -Patient was on Xarelto, completed starter pack however Xarelto was held due to bleeding issues at [facility] with debridement. -Right lower extremity venous Dopplers negative for DVT, will hold off on Xarelto  6)Elevated alk phos-----Unclear etiology, mild elevation in the direct bilirubin, daptomycin also causes abnormal LFTs, -Alk phos trending down 402 with the time of admission, 366  today - obtain right upper quadrant ultrasound, GGT, AMA, ANA, SMA  7)Severe protein calorie malnutrition--Albumin 2.2 -Placed on pro-stat, zinc daily   Disposition/Need for in-Hospital Stay- patient unable to be discharged at this time due to patient will need PICC line replaced if blood cultures are negative on 10/15/2018 and possible discharge home on IV vancomycin twice a day to be administered by  home health and family members  Code Status : Full Code  Family Communication:   None at bedside   Disposition Plan  : Home with home health IV vancomycin   Consults  :  ID/ortho   DVT Prophylaxis  :   - Heparin -    Lab Results  Component Value Date   PLT 234 10/10/2018    Inpatient Medications  Scheduled Meds:  amLODipine  5 mg Oral Daily   Chlorhexidine Gluconate Cloth  6 each Topical Q0600   feeding supplement (PRO-STAT SUGAR FREE 64)  30 mL Oral BID   heparin injection (subcutaneous)  5,000 Units Subcutaneous Q8H   insulin aspart  0-5 Units Subcutaneous QHS   insulin aspart  0-9 Units Subcutaneous TID WC   insulin aspart  4 Units Subcutaneous TID WC   insulin glargine  40 Units Subcutaneous BID   metoprolol tartrate  25 mg Oral BID   multivitamin with minerals  1 tablet Oral Daily   mupirocin ointment  1 application Nasal BID   zinc sulfate  220 mg Oral Daily   Continuous Infusions:  vancomycin     PRN Meds:.acetaminophen **OR** acetaminophen, bisacodyl, ketorolac, labetalol, senna-docusate, traMADol, zolpidem    Anti-infectives (From admission, onward)   Start     Dose/Rate Route Frequency Ordered Stop   10/11/18 1800  vancomycin (VANCOCIN) 1,000 mg in sodium chloride 0.9 % 250 mL IVPB     1,000 mg 250 mL/hr over 60 Minutes Intravenous Every 12 hours 10/11/18 0859     10/09/18 1800  vancomycin (VANCOCIN) 1,250 mg in sodium chloride 0.9 % 250 mL IVPB  Status:  Discontinued     1,250 mg 166.7 mL/hr over 90 Minutes Intravenous Every 12 hours 10/09/18 0831 10/11/18 0859   10/08/18 1745  vancomycin (VANCOCIN) 1,000 mg in sodium chloride 0.9 % 250 mL IVPB  Status:  Discontinued     1,000 mg 250 mL/hr over 60 Minutes Intravenous Every 12 hours 10/08/18 1315 10/09/18 0831   10/08/18 1030  DAPTOmycin (CUBICIN) 600 mg in sodium chloride 0.9 % IVPB     600 mg 224 mL/hr over 30 Minutes Intravenous Daily 10/08/18 1026 10/08/18 1149   10/08/18 0000   daptomycin (CUBICIN) IVPB  Status:  Discontinued     600 mg Intravenous Every 24 hours 10/08/18 0839 10/08/18    10/07/18 1400  vancomycin (VANCOCIN) 1,000 mg in sodium chloride 0.9 % 250 mL IVPB  Status:  Discontinued     1,000 mg 250 mL/hr over 60 Minutes Intravenous Every 8 hours 10/07/18 0351 10/08/18 0829   10/07/18 0430  vancomycin (VANCOCIN) 1,500 mg in sodium chloride 0.9 % 500 mL IVPB     1,500 mg 250 mL/hr over 120 Minutes Intravenous  Once 10/07/18 0351 10/07/18 0835   10/07/18 0430  piperacillin-tazobactam (ZOSYN) IVPB 3.375 g  Status:  Discontinued     3.375 g 12.5 mL/hr over 240 Minutes Intravenous Every 8 hours 10/07/18 0351 10/08/18 0829        Objective:   Vitals:   10/10/18 0416 10/10/18 1503 10/10/18 2040 10/11/18 0456  BP: (!) 137/98 Marland Kitchen)  144/99 122/88 (!) 148/101  Pulse: 96 99 (!) 105 (!) 101  Resp: '16 16 16 14  ' Temp: 98.5 F (36.9 C) 98.5 F (36.9 C) 98.7 F (37.1 C) 97.7 F (36.5 C)  TempSrc: Oral Oral Oral Oral  SpO2: 98% 98% 98% 100%  Weight:      Height:        Wt Readings from Last 3 Encounters:  10/07/18 77.1 kg     Intake/Output Summary (Last 24 hours) at 10/11/2018 1352 Last data filed at 10/11/2018 0900 Gross per 24 hour  Intake 840 ml  Output --  Net 840 ml     Physical Exam Patient is examined daily including today on 10/11/18 , exams remain the same as of yesterday except that has changed   Gen:- Awake Alert,  In no apparent distress  HEENT:- [place].AT, No sclera icterus Neck-Supple Neck,No JVD,.  Lungs-  CTAB , fair symmetrical air movement CV- S1, S2 normal, regular  Abd-  +ve B.Sounds, Abd Soft, No tenderness,    Extremity/Skin:-  pedal pulses present/-- Wound on fibular side of right leg appears clean and dry with good granulation tissue. Continues to wear compression sock.   Psych-affect is appropriate, oriented x3 Neuro-no new focal deficits, no tremors   Data Review:   Micro Results Recent Results (from the past 240 hour(s))   Culture, blood (routine x 2)     Status: None (Preliminary result)   Collection Time: 10/07/18  3:20 AM  Result Value Ref Range Status   Specimen Description BLOOD RIGHT ARM  Final   Special Requests   Final    BOTTLES DRAWN AEROBIC AND ANAEROBIC Blood Culture results may not be optimal due to an excessive volume of blood received in culture bottles   Culture   Final    NO GROWTH 3 DAYS Performed at [facility], [place] [address], [place], [place] [address]    Report Status PENDING  Incomplete  Culture, blood (routine x 2)     Status: Abnormal   Collection Time: 10/07/18  3:29 AM  Result Value Ref Range Status   Specimen Description BLOOD RIGHT HAND  Final   Special Requests   Final    BOTTLES DRAWN AEROBIC ONLY Blood Culture adequate volume   Culture  Setup Time   Final    GRAM POSITIVE COCCI IN CLUSTERS AEROBIC BOTTLE ONLY CRITICAL RESULT CALLED TO, READ BACK BY AND VERIFIED WITH: PHARMD AMELIE SAINTCLAIR AT 3664 ON 10/08/2018 BY MHOUEGNIFIO Performed at [facility], [place] [address], [place], [place] [address]    Culture METHICILLIN RESISTANT STAPHYLOCOCCUS AUREUS (A)  Final   Report Status 10/10/2018 FINAL  Final   Organism ID, Bacteria METHICILLIN RESISTANT STAPHYLOCOCCUS AUREUS  Final      Susceptibility   Methicillin resistant staphylococcus aureus - MIC*    CIPROFLOXACIN >=8 RESISTANT Resistant     ERYTHROMYCIN >=8 RESISTANT Resistant     GENTAMICIN <=0.5 SENSITIVE Sensitive     OXACILLIN >=4 RESISTANT Resistant     TETRACYCLINE <=1 SENSITIVE Sensitive     VANCOMYCIN 1 SENSITIVE Sensitive     TRIMETH/SULFA <=10 SENSITIVE Sensitive     CLINDAMYCIN >=8 RESISTANT Resistant     RIFAMPIN <=0.5 SENSITIVE Sensitive     Inducible Clindamycin NEGATIVE Sensitive     * METHICILLIN RESISTANT STAPHYLOCOCCUS AUREUS  Blood Culture ID Panel (Reflexed)     Status: Abnormal   Collection Time: 10/07/18  3:29 AM  Result Value Ref Range Status  Enterococcus species NOT  DETECTED NOT DETECTED Final   Listeria monocytogenes NOT DETECTED NOT DETECTED Final   Staphylococcus species DETECTED (A) NOT DETECTED Final    Comment: CRITICAL RESULT CALLED TO, READ BACK BY AND VERIFIED WITH: PHARMD AMELIE SAINTCLAIR AT 1038 ON 10/08/2018 BY MHOUEGNIFIO    Staphylococcus aureus (BCID) DETECTED (A) NOT DETECTED Final    Comment: Methicillin (oxacillin)-resistant Staphylococcus aureus (MRSA). MRSA is predictably resistant to beta-lactam antibiotics (except ceftaroline). Preferred therapy is vancomycin unless clinically contraindicated. Patient requires contact precautions if  hospitalized. CRITICAL RESULT CALLED TO, READ BACK BY AND VERIFIED WITH: [place] AT 4010 ON 10/08/2018 BY MHOUEGNIFIO    Methicillin resistance DETECTED (A) NOT DETECTED Final    Comment: CRITICAL RESULT CALLED TO, READ BACK BY AND VERIFIED WITH: [place] AT 2725 ON 10/08/2018 BY MHOUEGNIFIO    Streptococcus species NOT DETECTED NOT DETECTED Final   Streptococcus agalactiae NOT DETECTED NOT DETECTED Final   Streptococcus pneumoniae NOT DETECTED NOT DETECTED Final   Streptococcus pyogenes NOT DETECTED NOT DETECTED Final   Acinetobacter baumannii NOT DETECTED NOT DETECTED Final   Enterobacteriaceae species NOT DETECTED NOT DETECTED Final   Enterobacter cloacae complex NOT DETECTED NOT DETECTED Final   Escherichia coli NOT DETECTED NOT DETECTED Final   Klebsiella oxytoca NOT DETECTED NOT DETECTED Final   Klebsiella pneumoniae NOT DETECTED NOT DETECTED Final   Proteus species NOT DETECTED NOT DETECTED Final   Serratia marcescens NOT DETECTED NOT DETECTED Final   Haemophilus influenzae NOT DETECTED NOT DETECTED Final   Neisseria meningitidis NOT DETECTED NOT DETECTED Final   Pseudomonas aeruginosa NOT DETECTED NOT DETECTED Final   Candida albicans NOT DETECTED NOT DETECTED Final   Candida glabrata NOT DETECTED NOT DETECTED Final   Candida krusei NOT DETECTED NOT  DETECTED Final   Candida parapsilosis NOT DETECTED NOT DETECTED Final   Candida tropicalis NOT DETECTED NOT DETECTED Final    Comment: Performed at [facility], [place]. [address] [place], [place] [address]  MRSA PCR Screening     Status: Abnormal   Collection Time: 10/07/18  5:34 AM  Result Value Ref Range Status   MRSA by PCR POSITIVE (A) NEGATIVE Final    Comment:        The GeneXpert MRSA Assay (FDA approved for NASAL specimens only), is one component of a comprehensive MRSA colonization surveillance program. It is not intended to diagnose MRSA infection nor to guide or monitor treatment for MRSA infections. RESULT CALLED TO, READ BACK BY AND VERIFIED WITH: B. DAVIS RN, AT [phone] 10/07/18 BY Rush Landmark Performed at [facility], [place] [address], [place], [place] [address]   Urine Culture     Status: Abnormal   Collection Time: 10/07/18 10:30 AM  Result Value Ref Range Status   Specimen Description URINE, RANDOM  Final   Special Requests NONE  Final   Culture (A)  Final    <10,000 COLONIES/mL INSIGNIFICANT GROWTH Performed at [facility] [address] [place], [place] [address]    Report Status 10/08/2018 FINAL  Final  Culture, blood (routine x 2)     Status: None (Preliminary result)   Collection Time: 10/09/18 10:21 AM  Result Value Ref Range Status   Specimen Description BLOOD RIGHT ANTECUBITAL  Final   Special Requests   Final    BOTTLES DRAWN AEROBIC ONLY Blood Culture adequate volume   Culture   Final    NO GROWTH < 24 HOURS Performed at [facility], [place] [address], [place], [place] [address]    Report Status PENDING  Incomplete  Culture, blood (routine x 2)     Status: None (Preliminary result)   Collection Time: 10/09/18 10:23 AM  Result Value Ref Range Status   Specimen Description BLOOD LEFT HAND  Final   Special Requests   Final    BOTTLES DRAWN AEROBIC ONLY Blood Culture results may not be optimal due to an inadequate volume of  blood received in culture bottles   Culture   Final    NO GROWTH < 24 HOURS Performed at [facility], [place] [address], [place],  [address]    Report Status PENDING  Incomplete    Radiology Reports Dg Chest 1 View  Result Date: 10/07/2018 CLINICAL DATA:  MRSA bacteremia, PICC line placement EXAM: CHEST  1 VIEW COMPARISON:  None. FINDINGS: Lungs are clear.  No pleural effusion or pneumothorax. The heart is normal in size. Left arm PICC terminates in the upper SVC, 4.5 cm above the cavoatrial junction. IMPRESSION: Left arm PICC terminates in the upper SVC, 4.5 cm above the cavoatrial junction. Electronically Signed   By: Julian Hy M.D.   On: 10/07/2018 06:05   Dg Humerus Left  Result Date: 10/07/2018 CLINICAL DATA:  MRSA bacteremia, status post PICC line placement EXAM: LEFT HUMERUS - 2+ VIEW COMPARISON:  None. FINDINGS: No fracture or dislocation is seen. The joint spaces are preserved. The visualized soft tissues are unremarkable. Visualized left lung is clear. Left arm PICC courses through the axillary region into the chest. IMPRESSION: Negative. Left arm PICC courses through the axillary region into the chest. Electronically Signed   By: Julian Hy M.D.   On: 10/07/2018 06:04   Vas [place] Lower Extremity Venous (dvt)  Result Date: 10/08/2018  Lower Venous Study Indications: Follow up DVT, off anticoagulants due to bleeding.  Performing Technologist: June Leap RDMS, RVT  Examination Guidelines: A complete evaluation includes B-mode imaging, spectral Doppler, color Doppler, and power Doppler as needed of all accessible portions of each vessel. Bilateral testing is considered an integral part of a complete examination. Limited examinations for reoccurring indications may be performed as noted.  Right Venous Findings: +---------+---------------+---------+-----------+----------+-------+            Compressibility Phasicity Spontaneity Properties Summary   +---------+---------------+---------+-----------+----------+-------+  CFV       Full            Yes       Yes                             +---------+---------------+---------+-----------+----------+-------+  SFJ       Full                                                      +---------+---------------+---------+-----------+----------+-------+  FV Prox   Full                                                      +---------+---------------+---------+-----------+----------+-------+  FV Mid    Full                                                      +---------+---------------+---------+-----------+----------+-------+  FV Distal Full                                                      +---------+---------------+---------+-----------+----------+-------+  PFV       Full                                                      +---------+---------------+---------+-----------+----------+-------+  POP       Full            Yes       Yes                             +---------+---------------+---------+-----------+----------+-------+  PTV       Full                                                      +---------+---------------+---------+-----------+----------+-------+  PERO      Full                                                      +---------+---------------+---------+-----------+----------+-------+  Left Venous Findings: +---+---------------+---------+-----------+----------+-------+      Compressibility Phasicity Spontaneity Properties Summary  +---+---------------+---------+-----------+----------+-------+  CFV Full            Yes       Yes                             +---+---------------+---------+-----------+----------+-------+    Summary: Right: There is no evidence of deep vein thrombosis in the lower extremity. No cystic structure found in the popliteal fossa. Ultrasound characteristics of enlarged lymph nodes are noted in the groin. Left: No evidence of common femoral vein obstruction. Ultrasound characteristics of  enlarged lymph nodes noted in the groin.  *See table(s) above for measurements and observations. Electronically signed by Ruta Hinds MD on 10/08/2018 at 8:49:33 PM.    Final    US Abdomen Limited Ruq  Result Date: 10/09/2018 CLINICAL DATA:  Abnormal LFTs EXAM: ULTRASOUND ABDOMEN LIMITED RIGHT UPPER QUADRANT COMPARISON:  None FINDINGS: Gallbladder: Incompletely distended. Mildly thickened gallbladder wall. No shadowing calculi, pericholecystic fluid or sonographic Murphy sign. Common bile duct: Diameter: 4 mm diameter, normal Liver: Normal appearance. No mass or nodularity. Portal vein is patent on color Doppler imaging with normal direction of blood flow towards the liver. No free fluid in RIGHT upper quadrant. IMPRESSION: Incompletely distended gallbladder, which could account for a minimally thickened gallbladder wall. Otherwise negative exam. Electronically Signed   By: Lavonia Dana M.D.   On: 10/09/2018 16:24     CBC Recent Labs  Lab 10/08/18 0258 10/09/18 0616 10/10/18 0414  WBC 5.5 5.4 5.5  HGB 10.1* 10.0* 9.8*  HCT 31.7* 31.8* 30.9*  PLT 205 215 234  MCV 94.9 92.4 93.4  MCH 30.2 29.1 29.6  MCHC 31.9 31.4 31.7  RDW 12.7 12.9 13.3    Chemistries  Recent Labs  Lab 10/07/18 0329 10/08/18 0258 10/09/18 0616 10/10/18 0414  NA 135 137 142 138  K 3.3* 3.4* 3.2* 3.6  CL 104 105 109 107  CO2 '23 25 25 25  ' GLUCOSE 328* 174* 156* 167*  BUN <5* 5* 8 9  CREATININE 0.70 0.63 0.59 0.61  CALCIUM 8.5* 8.6* 9.0 9.2  MG 1.9  --   --   --   AST 55*  --  38 36  ALT 44  --  38 37  ALKPHOS 402*  --  366* 365*  BILITOT 0.8  --  1.0 1.2   ------------------------------------------------------------------------------------------------------------------ No results for input(s): CHOL, HDL, LDLCALC, TRIG, CHOLHDL, LDLDIRECT in the last 72 hours.  Lab Results  Component Value Date   HGBA1C 11.0 (H) 10/07/2018    ------------------------------------------------------------------------------------------------------------------ No results for input(s): TSH, T4TOTAL, T3FREE, THYROIDAB in the last 72 hours.  Invalid input(s): FREET3 ------------------------------------------------------------------------------------------------------------------ No results for input(s): VITAMINB12, FOLATE, FERRITIN, TIBC, IRON, RETICCTPCT in the last 72 hours.  Coagulation profile Recent Labs  Lab 10/07/18 0329  INR 1.0    No results for input(s): DDIMER in the last 72 hours.  Cardiac Enzymes No results for input(s): CKMB, TROPONINI, MYOGLOBIN in the last 168 hours.  Invalid input(s): CK ------------------------------------------------------------------------------------------------------------------ No results found for: BNP   Roxan Hockey M.D on 10/11/2018 at 1:52 PM  Go to [email or web address] - for contact info  [facility] - Office  [phone]

## 2018-10-11 NOTE — Plan of Care (Signed)
Pt with continued itching after benadryl given , call out to covering MD. One time dose ordered of Vistaril see mar.

## 2018-10-11 NOTE — Progress Notes (Signed)
Pharmacy Antibiotic Note  Adylynn Gans is a 43 y.o. female admitted on 10/07/2018 with MRSA bacteremia.   Pharmacy has been consulted for vancomycin dosing. Patient has previously been on daptomycin from 09/14/18 that she was receiving at an outpatinet infusion center. She has had a PICC line in for several weeks and repeat blood cultures from 3/29 are persistently positive for MRSA.   Vanc pk 41; Tr 20 Estimated AUC 602 SCr 0.61   Plan: Reduce Vanc to 1 g q12h (Predicted AUC 481) Continue to monitor cx renal fx and lvls prn   Height: 5\' 6"  (167.6 cm) Weight: 170 lb (77.1 kg) IBW/kg (Calculated) : 59.3  Temp (24hrs), Avg:98.3 F (36.8 C), Min:97.7 F (36.5 C), Max:98.7 F (37.1 C)  Recent Labs  Lab 10/07/18 0329 10/08/18 0258 10/09/18 0616 10/10/18 0414 10/11/18 0156 10/11/18 0750  WBC  --  5.5 5.4 5.5  --   --   CREATININE 0.70 0.63 0.59 0.61  --   --   VANCOTROUGH  --   --   --   --  20  --   VANCOPEAK  --   --   --   --   --  41*    Estimated Creatinine Clearance: 96 mL/min (by C-G formula based on SCr of 0.61 mg/dL).    No Known Allergies  Isaac Bliss, PharmD, BCPS, BCCCP Clinical Pharmacist [phone]  Please check AMION for all [facility] Pharmacy numbers  10/11/2018 8:59 AM

## 2018-10-11 NOTE — TOC Initial Note (Addendum)
Transition of Care [facility]) - Initial/Assessment Note    Patient Details  Name: Yolanda [name] MRN: [id number] Date of Birth: 1975-08-08  Transition of Care [facility]) CM/SW Contact:    Durenda Guthrie, RN Phone Number: 10/11/2018, 10:26 AM  Clinical Narrative:  43 yr old female admitted with osteomyelitis of right foot and ankle. Receiving IV antibiotics. Case manager spoke with patient concerning discharge plan. Patient doesn't receive Home Health services, In [place] no [facility] agency accepts medicaid coverage. Patient goes to [facility] for IV antibiotic infusions.  Expected Discharge Plan: Home/Self Care(patient goes to [facility] for IV antibiotics) Barriers to Discharge: No Barriers Identified   Patient Goals and CMS Choice        Expected Discharge Plan and Services Expected Discharge Plan: Home/Self Care(patient goes to [facility] for IV antibiotics)   Discharge Planning Services: CM Consult   Living arrangements for the past 2 months: Single Family Home Expected Discharge Date: 10/08/18               DME Arranged: N/A DME Agency: NA HH Arranged: NA    Prior Living Arrangements/Services Living arrangements for the past 2 months: Single Family Home Lives with:: Self   Do you feel safe going back to the place where you live?: Yes      Need for Family Participation in Patient Care: No (Comment)     Criminal Activity/Legal Involvement Pertinent to Current Situation/Hospitalization: No - Comment as needed  Activities of Daily Living Home Assistive Devices/Equipment: CBG Meter ADL Screening (condition at time of admission) Patient's cognitive ability adequate to safely complete daily activities?: Yes Is the patient deaf or have difficulty hearing?: No Does the patient have difficulty seeing, even when wearing glasses/contacts?: No Does the patient have difficulty concentrating, remembering, or making decisions?: No Patient able to express need for  assistance with ADLs?: No Does the patient have difficulty dressing or bathing?: No Independently performs ADLs?: Yes (appropriate for developmental age) Does the patient have difficulty walking or climbing stairs?: Yes Weakness of Legs: Right Weakness of Arms/Hands: None  Permission Sought/Granted Permission sought to share information with : Case Manager                Emotional Assessment       Orientation: : Oriented to Self, Oriented to Situation, Oriented to Place, Oriented to  Time Alcohol / Substance Use: Not Applicable    Admission diagnosis:  CELLULTIS- FOOT OSTEOMYLELITIS RT ANKLE Patient Active Problem List   Diagnosis Date Noted  . Abnormal LFTs   . Cigarette smoker 10/08/2018  . Osteomyelitis of ankle and foot (HCC) 10/07/2018  . Diabetes mellitus type 2, uncontrolled, with complications (HCC) 10/07/2018  . MRSA bacteremia 10/07/2018  . 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 Severe protein-calorie malnutrition (HCC)   . Leg wound, right, sequela   . Diabetic polyneuropathy associated with type 2 diabetes mellitus (HCC)    PCP:  No primary care provider on file. Pharmacy:   [id number] - [place], [place] - [address] AT [facility] [address] [place] [address] Phone: [phone] Fax: [phone]     Social Determinants of Health (SDOH) Interventions    Readmission Risk Interventions No flowsheet data found.

## 2018-10-11 NOTE — Progress Notes (Signed)
Physical Therapy Treatment Patient Details Name: Yolanda [name] MRN: [id number] DOB: 10/17/1975 Today's Date: 10/11/2018    History of Present Illness Yolanda [name] is a 43 y.o. female admitted for Diabetic foot ulcer with osteomyelitis. She has PMH of  uncontrolled diabetic  neuropathy who presents with a chronic wound/ulcer over the lateral aspect of the right leg.  Patient has been undergoing wound care and treatment for several months. She is NWB on Rt leg unless has boot.    PT Comments    Patient continues to make progress toward PT goals. Pt tolerated ambulating 100 ft with supervision and able to maintain NWB.  Pt continues to put weight through R LE when standing and needs cues for positioning to maintain weight bearing status. Current plan remains appropriate.    Follow Up Recommendations  Home health PT;Supervision for mobility/OOB     Equipment Recommendations  3in1 (PT)    Recommendations for Other Services       Precautions / Restrictions Precautions Precautions: Fall Restrictions Weight Bearing Restrictions: Yes RLE Weight Bearing: Non weight bearing    Mobility  Bed Mobility               General bed mobility comments: pt OOB in chair upon arrival  Transfers Overall transfer level: Needs assistance Equipment used: Rolling walker (2 wheeled) Transfers: Sit to/from Stand Sit to Stand: Supervision         General transfer comment: verbal and visual cues for positioning prior to stand to maintain NWB status  Ambulation/Gait Ambulation/Gait assistance: Supervision Gait Distance (Feet): 100 Feet Assistive device: Rolling walker (2 wheeled) Gait Pattern/deviations: Step-to pattern Gait velocity: decreased   General Gait Details: pt is able to maintain NWB throughout; cues for safe use of RW   Stairs             Wheelchair Mobility    Modified Rankin (Stroke Patients Only)       Balance Overall balance assessment: Needs assistance    Sitting balance-Leahy Scale: Good       Standing balance-Leahy Scale: Poor Standing balance comment: needs UE support due to WB status                            Cognition Arousal/Alertness: Awake/alert Behavior During Therapy: WFL for tasks assessed/performed Overall Cognitive Status: No family/caregiver present to determine baseline cognitive functioning Area of Impairment: Safety/judgement;Problem solving                         Safety/Judgement: Decreased awareness of deficits   Problem Solving: Slow processing;Requires verbal cues General Comments: pt is likely at baseline       Exercises      General Comments        Pertinent Vitals/Pain Pain Assessment: No/denies pain    Home Living                      Prior Function            PT Goals (current goals can now be found in the care plan section) Acute Rehab PT Goals Patient Stated Goal: return home Progress towards PT goals: Progressing toward goals    Frequency    Min 3X/week      PT Plan Current plan remains appropriate    Co-evaluation              AM-PAC PT "6 Clicks" Mobility  Outcome Measure  Help needed turning from your back to your side while in a flat bed without using bedrails?: None Help needed moving from lying on your back to sitting on the side of a flat bed without using bedrails?: None Help needed moving to and from a bed to a chair (including a wheelchair)?: A Little Help needed standing up from a chair using your arms (e.g., wheelchair or bedside chair)?: None Help needed to walk in hospital room?: A Little Help needed climbing 3-5 steps with a railing? : A Little 6 Click Score: 21    End of Session Equipment Utilized During Treatment: Gait belt Activity Tolerance: Patient tolerated treatment well Patient left: with call bell/phone within reach;in chair Nurse Communication: Mobility status PT Visit Diagnosis: Unsteadiness on feet  (R26.81);Other abnormalities of gait and mobility (R26.89);Muscle weakness (generalized) (M62.81);Difficulty in walking, not elsewhere classified (R26.2);Pain Pain - Right/Left: Right Pain - part of body: Ankle and joints of foot     Time: 1545-1600 PT Time Calculation (min) (ACUTE ONLY): 15 min  Charges:  $Gait Training: 8-22 mins                     Erline Levine, PTA Acute Rehabilitation Services Pager: [phone] Office: [phone]     Carolynne Edouard 10/11/2018, 4:30 PM

## 2018-10-11 NOTE — TOC Transition Note (Signed)
Transition of Care [facility]) - CM/SW Discharge Note   Patient Details  Name: Yolanda [name] MRN: [id number] Date of Birth: June 11, 1976  Transition [facility] Care [facility]) CM/SW Contact:  Durenda Guthrie, RN Phone Number: 10/11/2018, 10:35 AM   Clinical Narrative:  43 yr old female admitted with right foot and ankle osteo.  Case Manager called short stay at [facility] [phone], they will need order to continue IV antibiotics and any medication changes. Fax number is [phone]. CM will continue to monitor.                  Final next level of care: Home/Self Care Barriers to Discharge: No Barriers Identified   Patient Goals and CMS Choice        Discharge Placement                       Discharge Plan and Services   Discharge Planning Services: CM Consult            DME Arranged: N/A DME Agency: NA HH Arranged: NA     Social Determinants of Health (SDOH) Interventions     Readmission Risk Interventions No flowsheet data found.

## 2018-10-11 NOTE — Progress Notes (Addendum)
[facility]  Date of Admission:  10/07/2018   Total days of antibiotics 27         ASSESSMENT/PLAN:  Ms. Myklebust has MRSA bacteremia x1 on 2 blood cultures with osteo of the right foot and anklet and Charcot foot 2/2 diabetes. Repeat blood cultures 3/31 without growth currently and pending. Will wait five days for blood culture results to have PICC placed and then plan for six weeks of antibiotic treatment, likely Vancomycin as she has family at home to help with twice daily administration as bacteremia was present when she was already taking Vancomycin.   - cont. Vancomycin - monitor renal function, vanc trough - 3/31 blood cultures pending - will wait until 4/6 and culture results for PICC placement - discussed with patient and she understands  - reschedule appointment with Dr. 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)   Severe protein-calorie malnutrition (HCC)   Leg wound, right, sequela   Diabetic polyneuropathy associated with type 2 diabetes mellitus (HCC)   Cigarette smoker   Abnormal LFTs   Scheduled Meds:  amLODipine  5 mg Oral Daily   Chlorhexidine Gluconate Cloth  6 each Topical Q0600   feeding supplement (PRO-STAT SUGAR FREE 64)  30 mL Oral BID   heparin injection (subcutaneous)  5,000 Units Subcutaneous Q8H   insulin aspart  0-5 Units Subcutaneous QHS   insulin aspart  0-9 Units Subcutaneous TID WC   insulin aspart  3 Units Subcutaneous TID WC   insulin glargine  40 Units Subcutaneous BID   metoprolol tartrate  25 mg Oral BID   multivitamin with minerals  1 tablet Oral Daily   mupirocin ointment  1 application Nasal BID   zinc sulfate  220 mg Oral Daily   Continuous Infusions:  vancomycin     PRN Meds:.acetaminophen **OR**  acetaminophen, bisacodyl, ketorolac, labetalol, senna-docusate, traMADol, zolpidem   SUBJECTIVE: Afebrile overnight, sitting up in chair and feeling well this morning. States she would like to leave soon but understands and agreeable to waiting until 4/6. Denies leg pain, nausea, fever or chills.   Review of Systems: ROS ROS as noted in objective.  No Known Allergies  OBJECTIVE: Vitals:   10/10/18 0416 10/10/18 1503 10/10/18 2040 10/11/18 0456  BP: (!) 137/98 (!) 144/99 122/88 (!) 148/101  Pulse: 96 99 (!) 105 (!) 101  Resp: 16 16 16 14   Temp: 98.5 F (36.9 C) 98.5 F (36.9 C) 98.7 F (37.1 C) 97.7 F (36.5 C)  TempSrc: Oral Oral Oral Oral  SpO2: 98% 98% 98% 100%  Weight:      Height:       Body mass index is 27.44 kg/m.  Physical Exam Constitution: NAD, cooperative HENT: Howe/AT Eyes: no icterus or injection  Cardio: RRR, no LE edema Respiratory: non-labored breathing MSK: RLE covered with compression stocking, NTTP, no edema Neuro: a&o, pleasant Skin: otherwise c/d/i      Lab Results Lab Results  Component Value Date   WBC 5.5 10/10/2018   HGB 9.8 (L) 10/10/2018   HCT 30.9 (L) 10/10/2018   MCV 93.4 10/10/2018   PLT 234 10/10/2018    Lab Results  Component Value Date   CREATININE 0.61 10/10/2018   BUN 9 10/10/2018   NA  138 10/10/2018   K 3.6 10/10/2018   CL 107 10/10/2018   CO2 25 10/10/2018    Lab Results  Component Value Date   ALT 37 10/10/2018   AST 36 10/10/2018   ALKPHOS 365 (H) 10/10/2018   BILITOT 1.2 10/10/2018     Microbiology: Recent Results (from the past 240 hour(s))  Culture, blood (routine x 2)     Status: None (Preliminary result)   Collection Time: 10/07/18  3:20 AM  Result Value Ref Range Status   Specimen Description BLOOD RIGHT ARM  Final   Special Requests   Final    BOTTLES DRAWN AEROBIC AND ANAEROBIC Blood Culture results may not be optimal due to an excessive volume of blood received in culture bottles   Culture    Final    NO GROWTH 3 DAYS Performed at [facility], [address], [place], [place] [address]    Report Status PENDING  Incomplete  Culture, blood (routine x 2)     Status: Abnormal   Collection Time: 10/07/18  3:29 AM  Result Value Ref Range Status   Specimen Description BLOOD RIGHT HAND  Final   Special Requests   Final    BOTTLES DRAWN AEROBIC ONLY Blood Culture adequate volume   Culture  Setup Time   Final    GRAM POSITIVE COCCI IN CLUSTERS AEROBIC BOTTLE ONLY CRITICAL RESULT CALLED TO, READ BACK BY AND VERIFIED WITH: PHARMD AMELIE SAINTCLAIR AT 1038 ON 10/08/2018 BY MHOUEGNIFIO Performed at [facility], [address] [place], [place] [address]    Culture METHICILLIN RESISTANT STAPHYLOCOCCUS AUREUS (A)  Final   Report Status 10/10/2018 FINAL  Final   Organism ID, Bacteria METHICILLIN RESISTANT STAPHYLOCOCCUS AUREUS  Final      Susceptibility   Methicillin resistant staphylococcus aureus - MIC*    CIPROFLOXACIN >=8 RESISTANT Resistant     ERYTHROMYCIN >=8 RESISTANT Resistant     GENTAMICIN <=0.5 SENSITIVE Sensitive     OXACILLIN >=4 RESISTANT Resistant     TETRACYCLINE <=1 SENSITIVE Sensitive     VANCOMYCIN 1 SENSITIVE Sensitive     TRIMETH/SULFA <=10 SENSITIVE Sensitive     CLINDAMYCIN >=8 RESISTANT Resistant     RIFAMPIN <=0.5 SENSITIVE Sensitive     Inducible Clindamycin NEGATIVE Sensitive     * METHICILLIN RESISTANT STAPHYLOCOCCUS AUREUS  Blood Culture ID Panel (Reflexed)     Status: Abnormal   Collection Time: 10/07/18  3:29 AM  Result Value Ref Range Status   Enterococcus species NOT DETECTED NOT DETECTED Final   Listeria monocytogenes NOT DETECTED NOT DETECTED Final   Staphylococcus species DETECTED (A) NOT DETECTED Final    Comment: CRITICAL RESULT CALLED TO, READ BACK BY AND VERIFIED WITH: PHARMD AMELIE SAINTCLAIR AT 1038 ON 10/08/2018 BY MHOUEGNIFIO    Staphylococcus aureus (BCID) DETECTED (A) NOT DETECTED Final    Comment: Methicillin  (oxacillin)-resistant Staphylococcus aureus (MRSA). MRSA is predictably resistant to beta-lactam antibiotics (except ceftaroline). Preferred therapy is vancomycin unless clinically contraindicated. Patient requires contact precautions if  hospitalized. CRITICAL RESULT CALLED TO, READ BACK BY AND VERIFIED WITH: PHARMD AMELIE SAINTCLAIR AT 1038 ON 10/08/2018 BY MHOUEGNIFIO    Methicillin resistance DETECTED (A) NOT DETECTED Final    Comment: CRITICAL RESULT CALLED TO, READ BACK BY AND VERIFIED WITH: PHARMD AMELIE SAINTCLAIR AT 1038 ON 10/08/2018 BY MHOUEGNIFIO    Streptococcus species NOT DETECTED NOT DETECTED Final   Streptococcus agalactiae NOT DETECTED NOT DETECTED Final   Streptococcus pneumoniae NOT DETECTED NOT DETECTED  Final   Streptococcus pyogenes NOT DETECTED NOT DETECTED Final   Acinetobacter baumannii NOT DETECTED NOT DETECTED Final   Enterobacteriaceae species NOT DETECTED NOT DETECTED Final   Enterobacter cloacae complex NOT DETECTED NOT DETECTED Final   Escherichia coli NOT DETECTED NOT DETECTED Final   Klebsiella oxytoca NOT DETECTED NOT DETECTED Final   Klebsiella pneumoniae NOT DETECTED NOT DETECTED Final   Proteus species NOT DETECTED NOT DETECTED Final   Serratia marcescens NOT DETECTED NOT DETECTED Final   Haemophilus influenzae NOT DETECTED NOT DETECTED Final   Neisseria meningitidis NOT DETECTED NOT DETECTED Final   Pseudomonas aeruginosa NOT DETECTED NOT DETECTED Final   Candida albicans NOT DETECTED NOT DETECTED Final   Candida glabrata NOT DETECTED NOT DETECTED Final   Candida krusei NOT DETECTED NOT DETECTED Final   Candida parapsilosis NOT DETECTED NOT DETECTED Final   Candida tropicalis NOT DETECTED NOT DETECTED Final    Comment: Performed at [facility], [address], [place], [place] [address]  MRSA PCR Screening     Status: Abnormal   Collection Time: 10/07/18  5:34 AM  Result Value Ref Range Status   MRSA by PCR POSITIVE (A) NEGATIVE Final     Comment:        The GeneXpert MRSA Assay (FDA approved for NASAL specimens only), is one component of a comprehensive MRSA colonization surveillance program. It is not intended to diagnose MRSA infection nor to guide or monitor treatment for MRSA infections. RESULT CALLED TO, READ BACK BY AND VERIFIED WITH: B. DAVIS RN, AT [phone] 10/07/18 BY Renato Shin Performed at [facility], [address], [place], [place] [address]   Urine Culture     Status: Abnormal   Collection Time: 10/07/18 10:30 AM  Result Value Ref Range Status   Specimen Description URINE, RANDOM  Final   Special Requests NONE  Final   Culture (A)  Final    <10,000 COLONIES/mL INSIGNIFICANT GROWTH Performed at [facility], [address] [place], [place] [address]    Report Status 10/08/2018 FINAL  Final  Culture, blood (routine x 2)     Status: None (Preliminary result)   Collection Time: 10/09/18 10:21 AM  Result Value Ref Range Status   Specimen Description BLOOD RIGHT ANTECUBITAL  Final   Special Requests   Final    BOTTLES DRAWN AEROBIC ONLY Blood Culture adequate volume   Culture   Final    NO GROWTH < 24 HOURS Performed at [facility], [address] [place], [place] [address]    Report Status PENDING  Incomplete  Culture, blood (routine x 2)     Status: None (Preliminary result)   Collection Time: 10/09/18 10:23 AM  Result Value Ref Range Status   Specimen Description BLOOD LEFT HAND  Final   Special Requests   Final    BOTTLES DRAWN AEROBIC ONLY Blood Culture results may not be optimal due to an inadequate volume of blood received in culture bottles   Culture   Final    NO GROWTH < 24 HOURS Performed at [facility], [address], [place], [place] [address]    Report Status PENDING  Incomplete    Guinevere Scarlet A, DO 10/11/2018, 10:10 AM

## 2018-10-12 DIAGNOSIS — R197 Diarrhea, unspecified: Secondary | ICD-10-CM

## 2018-10-12 DIAGNOSIS — T80219A Unspecified infection due to central venous catheter, initial encounter: Secondary | ICD-10-CM

## 2018-10-12 LAB — CBC
HCT: 32.5 % — ABNORMAL LOW (ref 36.0–46.0)
Hemoglobin: 10 g/dL — ABNORMAL LOW (ref 12.0–15.0)
MCH: 28.8 pg (ref 26.0–34.0)
MCHC: 30.8 g/dL (ref 30.0–36.0)
MCV: 93.7 fL (ref 80.0–100.0)
Platelets: 241 10*3/uL (ref 150–400)
RBC: 3.47 MIL/uL — ABNORMAL LOW (ref 3.87–5.11)
RDW: 13.3 % (ref 11.5–15.5)
WBC: 6 10*3/uL (ref 4.0–10.5)
nRBC: 0 % (ref 0.0–0.2)

## 2018-10-12 LAB — BASIC METABOLIC PANEL
Anion gap: 4 — ABNORMAL LOW (ref 5–15)
BUN: 15 mg/dL (ref 6–20)
CO2: 26 mmol/L (ref 22–32)
Calcium: 8.6 mg/dL — ABNORMAL LOW (ref 8.9–10.3)
Chloride: 106 mmol/L (ref 98–111)
Creatinine, Ser: 0.65 mg/dL (ref 0.44–1.00)
GFR calc Af Amer: >60 mL/min (ref >60)
GFR calc non Af Amer: >60 mL/min (ref >60)
Glucose, Bld: 145 mg/dL — ABNORMAL HIGH (ref 70–99)
Potassium: 3.4 mmol/L — ABNORMAL LOW (ref 3.5–5.1)
Sodium: 136 mmol/L (ref 135–145)

## 2018-10-12 LAB — GLUCOSE, CAPILLARY
Glucose-Capillary: 73 mg/dL (ref 70–99)
Glucose-Capillary: 197 mg/dL — ABNORMAL HIGH (ref 70–99)
Glucose-Capillary: 392 mg/dL — ABNORMAL HIGH (ref 70–99)
Glucose-Capillary: 232 mg/dL — ABNORMAL HIGH (ref 70–99)

## 2018-10-12 LAB — CULTURE, BLOOD (ROUTINE X 2): Culture: NO GROWTH

## 2018-10-12 MED ORDER — POTASSIUM CHLORIDE CRYS ER 20 MEQ PO TBCR
40.0000 meq | EXTENDED_RELEASE_TABLET | Freq: Once | ORAL | Status: DC
  Filled 2018-10-12: qty 2

## 2018-10-12 MED ORDER — POTASSIUM CHLORIDE CRYS ER 20 MEQ PO TBCR
40.0000 meq | EXTENDED_RELEASE_TABLET | Freq: Once | ORAL | Status: AC
  Administered 2018-10-12: 40 meq via ORAL

## 2018-10-12 MED ORDER — HYDROXYZINE HCL 25 MG PO TABS
25.0000 mg | ORAL_TABLET | Freq: Four times a day (QID) | ORAL | Status: AC | PRN
  Administered 2018-10-12 – 2018-10-13 (×2): 25 mg via ORAL
  Filled 2018-10-12 (×2): qty 1

## 2018-10-12 MED ORDER — POTASSIUM CHLORIDE CRYS ER 20 MEQ PO TBCR
40.0000 meq | EXTENDED_RELEASE_TABLET | Freq: Once | ORAL | Status: AC
  Administered 2018-10-12: 40 meq via ORAL
  Filled 2018-10-12: qty 2

## 2018-10-12 NOTE — Progress Notes (Signed)
Physical Therapy Treatment Patient Details Name: Yolanda [name] MRN: [id number] DOB: 1975-12-16 Today's Date: 10/12/2018    History of Present Illness Yolanda [name] is a 43 y.o. female admitted for Diabetic foot ulcer with osteomyelitis. She has PMH of  uncontrolled diabetic  neuropathy who presents with a chronic wound/ulcer over the lateral aspect of the right leg.  Patient has been undergoing wound care and treatment for several months. She is NWB on Rt leg unless has boot.    PT Comments    Pt seen for mobility progression.  Pt reports freq loose stools; declined OOB activity, including transfers. Pt agreeable to therapeutic exercises in bed. Pt tolerated session well. Able to recall precautions of WB status when OOB.  Current plan remains appropriate.    Follow Up Recommendations  Home health PT;Supervision for mobility/OOB     Equipment Recommendations  3in1 (PT)    Recommendations for Other Services       Precautions / Restrictions Precautions Precautions: Fall Restrictions Weight Bearing Restrictions: Yes RLE Weight Bearing: Non weight bearing Other Position/Activity Restrictions: cam walker    Mobility  Bed Mobility Overal bed mobility: Independent Bed Mobility: Rolling Rolling: Independent         General bed mobility comments: Pt declined OOB due to freq loose stools.  Transfers                    Ambulation/Gait                 Stairs             Wheelchair Mobility    Modified Rankin (Stroke Patients Only)       Balance                                            Cognition Arousal/Alertness: Awake/alert Behavior During Therapy: WFL for tasks assessed/performed Overall Cognitive Status: No family/caregiver present to determine baseline cognitive functioning Area of Impairment: Safety/judgement;Problem solving                             Problem Solving: Slow processing;Requires verbal  cues General Comments: pt is likely at baseline       Exercises General Exercises - Lower Extremity Heel Slides: Right;Left;10 reps;Supine Hip ABduction/ADduction: Right;Left;10 reps;Sidelying Straight Leg Raises: Right;Left;10 reps;Supine Other Exercises Other Exercises: single leg LLE bridge with RLE in fig 4 x 5 reps with cues for technique    General Comments        Pertinent Vitals/Pain Pain Assessment: Faces Faces Pain Scale: No hurt    Home Living                      Prior Function            PT Goals (current goals can now be found in the care plan section) Acute Rehab PT Goals Patient Stated Goal: return home    Frequency    Min 3X/week      PT Plan Current plan remains appropriate    Co-evaluation              AM-PAC PT "6 Clicks" Mobility   Outcome Measure  Help needed turning from your back to your side while in a flat bed without using bedrails?: None Help needed moving from lying on your  back to sitting on the side of a flat bed without using bedrails?: None Help needed moving to and from a bed to a chair (including a wheelchair)?: A Little Help needed standing up from a chair using your arms (e.g., wheelchair or bedside chair)?: None Help needed to walk in hospital room?: A Little Help needed climbing 3-5 steps with a railing? : A Little 6 Click Score: 21    End of Session   Activity Tolerance: Patient tolerated treatment well Patient left: in bed;with call bell/phone within reach;with nursing/sitter in room(NT arrived at end of session to bathe pt. ) Nurse Communication: Mobility status PT Visit Diagnosis: Unsteadiness on feet (R26.81);Other abnormalities of gait and mobility (R26.89);Muscle weakness (generalized) (M62.81);Difficulty in walking, not elsewhere classified (R26.2);Pain Pain - Right/Left: Right Pain - part of body: Ankle and joints of foot     Time: 1125-1141 PT Time Calculation (min) (ACUTE ONLY): 16  min  Charges:  $Therapeutic Exercise: 8-22 mins                    Mayer Camel, PTA Acute Rehab [phone]

## 2018-10-12 NOTE — Progress Notes (Signed)
Subjective:    Patient reports pain as mild.   Reports no problems wearing the socks and tolerating well.   Objective: Vital signs in last 24 hours: Temp:  [97.4 F (36.3 C)-98.4 F (36.9 C)] 98.3 F (36.8 C) (04/03 0817) Pulse Rate:  [98-105] 100 (04/03 0817) Resp:  [16-18] 16 (04/03 0817) BP: (124-129)/(85-94) 124/85 (04/03 0817) SpO2:  [96 %-100 %] 99 % (04/03 0817)  Intake/Output from previous day: 04/02 0701 - 04/03 0700 In: 818.8 [P.O.:600; IV Piggyback:218.8] Out: -  Intake/Output this shift: No intake/output data recorded.  Recent Labs    10/10/18 0414 10/12/18 0142  HGB 9.8* 10.0*   Recent Labs    10/10/18 0414 10/12/18 0142  WBC 5.5 6.0  RBC 3.31* 3.47*  HCT 30.9* 32.5*  PLT 234 241   Recent Labs    10/10/18 0414 10/12/18 0142  NA 138 136  K 3.6 3.4*  CL 107 106  CO2 25 26  BUN 9 15  CREATININE 0.61 0.65  GLUCOSE 167* 145*  CALCIUM 9.2 8.6*   No results for input(s): LABPT, INR in the last 72 hours.  The right lower leg has Vive compression sock in place and patient tolerating well. Minimal drainage on the sock . Right lower leg lateral wound with pale pink wound bed with minimal yellow slough and moderate serous appearing drainage. No odor.    Assessment/Plan:    Right lower leg wound - Continue single sock layer during nighttime and both layers during daytime.  Can wash the sock with soap and water and allow to dry  ( best to place sock in dryer on low heat x 10 minutes to reshape when discharged to home)  Follow  Up in office once discharged home.    Lazaro Arms, PA-C 10/12/2018, 8:27 AM  [facility] [phone]

## 2018-10-12 NOTE — Progress Notes (Addendum)
   INFECTIOUS DISEASE ATTENDING ADDENDUM:   This patient has been seen and discussed with the house staff. Please see the resident's note for complete details. I have reviewed the pertinent  laboratory and radiographic data and examined the patient independently.  I concur with their findings with the following additions/corrections:  Patient without any new complaints   Blood cultures remain negative.   We will plan on 6 weeks of IV vancomycin from date of negative blood cultures followed by oral doxycycline.  Plan for IV antibiotics is as below.  Diagnosis: MRSA bacteremia and osteomyelitis, PICC line infection  Culture Result: MRSA  No Known Allergies  OPAT Orders Discharge antibiotics: Vancomycin per pharmacy protocol  Aim for Vancomycin trough 15-20 (unless otherwise indicated) Duration: 6 weeks End Date:  May 12th, 2020  [place] Per Protocol:  Labs BI- weekly while on IV antibiotics:  _x_ BMP w GFR  Labs weekly while on IV antibiotics: _x_ CBC with differential  x__ CRP x__ ESR  _x_ Vancomycin trough   x__ Please pull PIC at completion of IV antibiotics __ Please leave PIC in place until doctor has seen patient or been notified  Fax weekly labs to [phone]  She has an EVISIT followup with Dr. Tommy Medal on May 11th, 2020 @ 1045.  I will trust Dr. Jess Barters monitoring of the wound and keep up with his assessments but visit with her remotely initially to reduce unnecessary physical visits.  My partner Dr. Linus Salmons is available for questions over the weekend and I will be back on Monday.

## 2018-10-12 NOTE — Progress Notes (Signed)
Pt received into room 6N10 via bed from room 5N27, contact precautions, settled into room and dept.

## 2018-10-12 NOTE — Progress Notes (Signed)
Patient Demographics:    Yolanda [name], is a 43 y.o. female, DOB - [birth year], ERD:[id number]  Admit date - 10/07/2018   Admitting Physician Allie Bossier, MD  Outpatient Primary MD for the patient is No primary care provider on file.  LOS - 5   No chief complaint on file.       Subjective:    Yolanda [name] today has no fevers, no emesis,  No chest pain, patient with loose stools overnight, no vomiting abdominal pain  Assessment  & Plan :    Principal Problem:   MRSA bacteremia Active Problems:   Osteomyelitis of ankle and foot ([place])   Diabetes mellitus type 2, uncontrolled, with complications ([place])   Charcot foot due to diabetes mellitus ([place])   Severe protein-calorie malnutrition (H[facility])   Leg wound, right, sequela   Diabetic polyneuropathy associated with type 2 diabetes mellitus ([facility])   Cigarette smoker   Abnormal LFTs  Brief Summary  43 y.o.  BF PMHx Uncontrolled Diabetes type 2 with complication, Rt Foot/Ankle diabetic foot ulcer/Charcot's foot, osteomyelitis and Celllulitis with MRSA Bacteremia as well  4 weeks PTA treated with iv Daptomycin (from 09/14/2018 thru 10/07/18) . Was followed by surgery at [facility] who contacted Dr Sharol Given from orthopedic surgery who agreed to see patient in consult .  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 Failed Daptomycin (blood cultures remain positive despite more than 3 weeks of IV daptomycin ((from 09/14/2018 thru 10/07/18) ),   TTE with suspicion of endocarditis, cardiology reluctant to do TEE given current COVID-19 situation bacteremia persisted despite IV daptomycin, ID recommends IV vancomycin for 6 weeks from first negative culture (10/09/18)...... Previous Lt Arm PICC line removed, --- Patient was on daptomycin at the outside facility, started on  09/14/2018, supposed to finish on 4/10 for total 6 weeks. PICC line in left arm Removed due to +ve Repeat blood cultures done here from 10/07/2018+ for MRSA, repeat blood culture from 10/09/2018 NGTD, afebrile blood cultures remain negative may replace PICC line on 10/15/2018--..the patient will Need-IV vancomycin twice a day at short stay at [facility], they will need order to continue IV antibiotics and any medication changes. Fax number is [phone]  2)Diabetes type 2 uncontrolled with complication--.  A1c this admission 11.0 reflecting uncontrolled DM, Use Novolog/Humalog Sliding scale insulin with Accu-Cheks/Fingersticks as ordered, continue Lantus insulin 40 units twice daily, change NovoLog insulin to 4 units 3 times daily with meals  3)HTN----stable, c/n Amlodipine 5 mg daily, metoprolol 25 mg bid,  may use IV labetalol  10 mg when necessary  Every 4 hours for systolic blood pressure over 165 mmhg  4)Osteomyelitis RIGHT foot/ankle with cellulitis and Charcot Foot--- Dr. Sharol Given recommends no surgical intervention at this time, continue IV vancomycin , plan is for PICC line placement on 10/15/2018 if negative Repeat blood cultures  5)Right lower extremity DVT, diagnosed in 07/2018 -Patient was on Xarelto, completed starter pack however Xarelto was held due to bleeding issues at [facility] with debridement. -Right lower extremity venous Dopplers negative for DVT, will hold off on Xarelto  6)Elevated alk phos-----Unclear etiology, AST down to 36 ALT down to 37,  total bili 1.2 , daptomycin also causes abnormal LFTs, -Alk phos trending down to 365 from above 400 on admission,  - right upper quadrant ultrasound without acute findings, GGT, AMA, ANA, SMA mostly unremarkable  7)Severe protein calorie malnutrition--Albumin 2.2 -Placed on pro-stat, zinc daily  8)Diarrhea------ per ID physician C. difficile less likely given that patient has been mostly on daptomycin and vancomycin,  vancomycin does not really penetrate GI tract well... Continue to monitor and treat empirically  Disposition/Need for in-Hospital Stay- patient unable to be discharged at this time due to patient will need PICC line replaced if blood cultures are negative on 10/15/2018 and possible discharge home on IV vancomycin twice a day to be administered by home health and family members--- Please see OPAT orders  Code Status : Full Code  Family Communication:   None at bedside   Disposition Plan  : Home with home health IV vancomycin-- see OPAT orders OPAT Orders Discharge antibiotics: Vancomycin per pharmacy protocol  Aim for Vancomycin trough 15-20 (unless otherwise indicated) Duration: 6 weeks End Date:  May 12th, 2020  [place] Per Protocol:  Labs BI- weekly while on IV antibiotics:  _x_ BMP w GFR  Labs weekly while on IV antibiotics: _x_ CBC with differential  x__ CRP x__ ESR  _x_ Vancomycin trough   x__ Please pull PIC at completion of IV antibiotics __ Please leave PIC in place until doctor has seen patient or been notified  Fax weekly labs to [phone]  She has an EVISIT followup with Dr. Tommy Medal on May 11th, 2020 @ 1045.    Consults  :  ID/ortho   DVT Prophylaxis  :   - Heparin -    Lab Results  Component Value Date   PLT 241 10/12/2018    Inpatient Medications  Scheduled Meds:  amLODipine  5 mg Oral Daily   Chlorhexidine Gluconate Cloth  6 each Topical Q0600   feeding supplement (PRO-STAT SUGAR FREE 64)  30 mL Oral BID   heparin injection (subcutaneous)  5,000 Units Subcutaneous Q8H   insulin aspart  0-5 Units Subcutaneous QHS   insulin aspart  0-9 Units Subcutaneous TID WC   insulin aspart  4 Units Subcutaneous TID WC   insulin glargine  40 Units Subcutaneous BID   metoprolol tartrate  25 mg Oral BID   multivitamin with minerals  1 tablet Oral Daily   potassium chloride  40 mEq Oral Once   potassium chloride  40 mEq Oral  Once   zinc sulfate  220 mg Oral Daily   Continuous Infusions:  vancomycin 1,000 mg (10/12/18 0505)   PRN Meds:.acetaminophen **OR** acetaminophen, bisacodyl, labetalol, senna-docusate, traMADol, zolpidem   Anti-infectives (From admission, onward)   Start     Dose/Rate Route Frequency Ordered Stop   10/11/18 1800  vancomycin (VANCOCIN) 1,000 mg in sodium chloride 0.9 % 250 mL IVPB     1,000 mg 250 mL/hr over 60 Minutes Intravenous Every 12 hours 10/11/18 0859     10/09/18 1800  vancomycin (VANCOCIN) 1,250 mg in sodium chloride 0.9 % 250 mL IVPB  Status:  Discontinued     1,250 mg 166.7 mL/hr over 90 Minutes Intravenous Every 12 hours 10/09/18 0831 10/11/18 0859   10/08/18 1745  vancomycin (VANCOCIN) 1,000 mg in sodium chloride 0.9 % 250 mL IVPB  Status:  Discontinued     1,000 mg 250 mL/hr over 60 Minutes Intravenous Every 12 hours 10/08/18 1315 10/09/18 0831   10/08/18 1030  DAPTOmycin (CUBICIN)  600 mg in sodium chloride 0.9 % IVPB     600 mg 224 mL/hr over 30 Minutes Intravenous Daily 10/08/18 1026 10/08/18 1149   10/08/18 0000  daptomycin (CUBICIN) IVPB  Status:  Discontinued     600 mg Intravenous Every 24 hours 10/08/18 0839 10/08/18    10/07/18 1400  vancomycin (VANCOCIN) 1,000 mg in sodium chloride 0.9 % 250 mL IVPB  Status:  Discontinued     1,000 mg 250 mL/hr over 60 Minutes Intravenous Every 8 hours 10/07/18 0351 10/08/18 0829   10/07/18 0430  vancomycin (VANCOCIN) 1,500 mg in sodium chloride 0.9 % 500 mL IVPB     1,500 mg 250 mL/hr over 120 Minutes Intravenous  Once 10/07/18 0351 10/07/18 0835   10/07/18 0430  piperacillin-tazobactam (ZOSYN) IVPB 3.375 g  Status:  Discontinued     3.375 g 12.5 mL/hr over 240 Minutes Intravenous Every 8 hours 10/07/18 0351 10/08/18 0829        Objective:   Vitals:   10/12/18 0355 10/12/18 0817 10/12/18 1020 10/12/18 1247  BP: 124/87 124/85 (!) 126/91 (!) 149/109  Pulse: (!) 103 100 (!) 101 (!) 106  Resp: '18 16 15 16  ' Temp: 98.4  F (36.9 C) 98.3 F (36.8 C) 98 F (36.7 C) 98.2 F (36.8 C)  TempSrc: Oral Oral Oral Oral  SpO2: 96% 99% 100% 100%  Weight:      Height:        Wt Readings from Last 3 Encounters:  10/07/18 77.1 kg    Intake/Output Summary (Last 24 hours) at 10/12/2018 1343 Last data filed at 10/12/2018 0900 Gross per 24 hour  Intake 938.84 ml  Output --  Net 938.84 ml   Physical Exam Patient is examined daily including today on 10/12/18 , exams remain the same as of yesterday except that has changed   Gen:- Awake Alert,  In no apparent distress  HEENT:- [place].AT, No sclera icterus Neck-Supple Neck,No JVD,.  Lungs-  CTAB , fair symmetrical air movement CV- S1, S2 normal, regular  Abd-  +ve B.Sounds, Abd Soft, No tenderness,    Extremity/Skin:-  pedal pulses present/-- Wound on fibular side of right leg appears clean and dry with good granulation tissue. Continues to wear compression sock.   Psych-affect is appropriate, oriented x3 Neuro-no new focal deficits, no tremors   Data Review:   Micro Results Recent Results (from the past 240 hour(s))  Culture, blood (routine x 2)     Status: None   Collection Time: 10/07/18  3:20 AM  Result Value Ref Range Status   Specimen Description BLOOD RIGHT ARM  Final   Special Requests   Final    BOTTLES DRAWN AEROBIC AND ANAEROBIC Blood Culture results may not be optimal due to an excessive volume of blood received in culture bottles   Culture   Final    NO GROWTH 5 DAYS Performed at [facility], [place] [address] [place], [place] [address]    Report Status 10/12/2018 FINAL  Final  Culture, blood (routine x 2)     Status: Abnormal   Collection Time: 10/07/18  3:29 AM  Result Value Ref Range Status   Specimen Description BLOOD RIGHT HAND  Final   Special Requests   Final    BOTTLES DRAWN AEROBIC ONLY Blood Culture adequate volume   Culture  Setup Time   Final    GRAM POSITIVE COCCI IN CLUSTERS AEROBIC BOTTLE ONLY CRITICAL RESULT CALLED TO, READ  BACK BY AND VERIFIED WITH:  [place] 9935 ON 10/08/2018 BY MHOUEGNIFIO Performed at [facility], [place] [address], [place], [place] [address]    Culture METHICILLIN RESISTANT STAPHYLOCOCCUS AUREUS (A)  Final   Report Status 10/10/2018 FINAL  Final   Organism ID, Bacteria METHICILLIN RESISTANT STAPHYLOCOCCUS AUREUS  Final      Susceptibility   Methicillin resistant staphylococcus aureus - MIC*    CIPROFLOXACIN >=8 RESISTANT Resistant     ERYTHROMYCIN >=8 RESISTANT Resistant     GENTAMICIN <=0.5 SENSITIVE Sensitive     OXACILLIN >=4 RESISTANT Resistant     TETRACYCLINE <=1 SENSITIVE Sensitive     VANCOMYCIN 1 SENSITIVE Sensitive     TRIMETH/SULFA <=10 SENSITIVE Sensitive     CLINDAMYCIN >=8 RESISTANT Resistant     RIFAMPIN <=0.5 SENSITIVE Sensitive     Inducible Clindamycin NEGATIVE Sensitive     * METHICILLIN RESISTANT STAPHYLOCOCCUS AUREUS  Blood Culture ID Panel (Reflexed)     Status: Abnormal   Collection Time: 10/07/18  3:29 AM  Result Value Ref Range Status   Enterococcus species NOT DETECTED NOT DETECTED Final   Listeria monocytogenes NOT DETECTED NOT DETECTED Final   Staphylococcus species DETECTED (A) NOT DETECTED Final    Comment: CRITICAL RESULT CALLED TO, READ BACK BY AND VERIFIED WITH: PHARMD AMELIE [place] AT 1038 ON 10/08/2018 BY MHOUEGNIFIO    Staphylococcus aureus (BCID) DETECTED (A) NOT DETECTED Final    Comment: Methicillin (oxacillin)-resistant Staphylococcus aureus (MRSA). MRSA is predictably resistant to beta-lactam antibiotics (except ceftaroline). Preferred therapy is vancomycin unless clinically contraindicated. Patient requires contact precautions if  hospitalized. CRITICAL RESULT CALLED TO, READ BACK BY AND VERIFIED WITH: [place] AT 9390 ON 10/08/2018 BY MHOUEGNIFIO    Methicillin resistance DETECTED (A) NOT DETECTED Final    Comment: CRITICAL RESULT CALLED TO, READ BACK BY AND VERIFIED WITH: [place]  AT 3009 ON 10/08/2018 BY MHOUEGNIFIO    Streptococcus species NOT DETECTED NOT DETECTED Final   Streptococcus agalactiae NOT DETECTED NOT DETECTED Final   Streptococcus pneumoniae NOT DETECTED NOT DETECTED Final   Streptococcus pyogenes NOT DETECTED NOT DETECTED Final   Acinetobacter baumannii NOT DETECTED NOT DETECTED Final   Enterobacteriaceae species NOT DETECTED NOT DETECTED Final   Enterobacter cloacae complex NOT DETECTED NOT DETECTED Final   Escherichia coli NOT DETECTED NOT DETECTED Final   Klebsiella oxytoca NOT DETECTED NOT DETECTED Final   Klebsiella pneumoniae NOT DETECTED NOT DETECTED Final   Proteus species NOT DETECTED NOT DETECTED Final   Serratia marcescens NOT DETECTED NOT DETECTED Final   Haemophilus influenzae NOT DETECTED NOT DETECTED Final   Neisseria meningitidis NOT DETECTED NOT DETECTED Final   Pseudomonas aeruginosa NOT DETECTED NOT DETECTED Final   Candida albicans NOT DETECTED NOT DETECTED Final   Candida glabrata NOT DETECTED NOT DETECTED Final   Candida krusei NOT DETECTED NOT DETECTED Final   Candida parapsilosis NOT DETECTED NOT DETECTED Final   Candida tropicalis NOT DETECTED NOT DETECTED Final    Comment: Performed at [facility], [place]. [address] [place], [place] [address]  MRSA PCR Screening     Status: Abnormal   Collection Time: 10/07/18  5:34 AM  Result Value Ref Range Status   MRSA by PCR POSITIVE (A) NEGATIVE Final    Comment:        The GeneXpert MRSA Assay (FDA approved for NASAL specimens only), is one component of a comprehensive MRSA colonization surveillance program. It is not intended to diagnose MRSA infection nor to guide  or monitor treatment for MRSA infections. RESULT CALLED TO, READ BACK BY AND VERIFIED WITH: B. DAVIS RN, AT [phone] 10/07/18 BY Rush Landmark Performed at [facility], [place] [address], [place], [place] [address]   Urine Culture     Status: Abnormal   Collection Time: 10/07/18 10:30 AM  Result Value Ref  Range Status   Specimen Description URINE, RANDOM  Final   Special Requests NONE  Final   Culture (A)  Final    <10,000 COLONIES/mL INSIGNIFICANT GROWTH Performed at [place] [address], [place], [place] [address]    Report Status 10/08/2018 FINAL  Final  Culture, blood (routine x 2)     Status: None (Preliminary result)   Collection Time: 10/09/18 10:21 AM  Result Value Ref Range Status   Specimen Description BLOOD RIGHT ANTECUBITAL  Final   Special Requests   Final    BOTTLES DRAWN AEROBIC ONLY Blood Culture adequate volume   Culture   Final    NO GROWTH 3 DAYS Performed at [facility], [place] [address] [place], [place] [address]    Report Status PENDING  Incomplete  Culture, blood (routine x 2)     Status: None (Preliminary result)   Collection Time: 10/09/18 10:23 AM  Result Value Ref Range Status   Specimen Description BLOOD LEFT HAND  Final   Special Requests   Final    BOTTLES DRAWN AEROBIC ONLY Blood Culture results may not be optimal due to an inadequate volume of blood received in culture bottles   Culture   Final    NO GROWTH 3 DAYS Performed at [facility], [place] [address] [place], [place] [address]    Report Status PENDING  Incomplete    Radiology Reports Dg Chest 1 View  Result Date: 10/07/2018 CLINICAL DATA:  MRSA bacteremia, PICC line placement EXAM: CHEST  1 VIEW COMPARISON:  None. FINDINGS: Lungs are clear.  No pleural effusion or pneumothorax. The heart is normal in size. Left arm PICC terminates in the upper SVC, 4.5 cm above the cavoatrial junction. IMPRESSION: Left arm PICC terminates in the upper SVC, 4.5 cm above the cavoatrial junction. Electronically Signed   By: Julian Hy M.D.   On: 10/07/2018 06:05   Dg Humerus Left  Result Date: 10/07/2018 CLINICAL DATA:  MRSA bacteremia, status post PICC line placement EXAM: LEFT HUMERUS - 2+ VIEW COMPARISON:  None. FINDINGS: No fracture or dislocation is seen. The joint spaces  are preserved. The visualized soft tissues are unremarkable. Visualized left lung is clear. Left arm PICC courses through the axillary region into the chest. IMPRESSION: Negative. Left arm PICC courses through the axillary region into the chest. Electronically Signed   By: Julian Hy M.D.   On: 10/07/2018 06:04   Vas [place] Lower Extremity Venous (dvt)  Result Date: 10/08/2018  Lower Venous Study Indications: Follow up DVT, off anticoagulants due to bleeding.  Performing Technologist: June Leap RDMS, RVT  Examination Guidelines: A complete evaluation includes B-mode imaging, spectral Doppler, color Doppler, and power Doppler as needed of all accessible portions of each vessel. Bilateral testing is considered an integral part of a complete examination. Limited examinations for reoccurring indications may be performed as noted.  Right Venous Findings: +---------+---------------+---------+-----------+----------+-------+            Compressibility Phasicity Spontaneity Properties Summary  +---------+---------------+---------+-----------+----------+-------+  CFV       Full  Yes       Yes                             +---------+---------------+---------+-----------+----------+-------+  SFJ       Full                                                      +---------+---------------+---------+-----------+----------+-------+  FV Prox   Full                                                      +---------+---------------+---------+-----------+----------+-------+  FV Mid    Full                                                      +---------+---------------+---------+-----------+----------+-------+  FV Distal Full                                                      +---------+---------------+---------+-----------+----------+-------+  PFV       Full                                                      +---------+---------------+---------+-----------+----------+-------+  POP       Full            Yes       Yes                              +---------+---------------+---------+-----------+----------+-------+  PTV       Full                                                      +---------+---------------+---------+-----------+----------+-------+  PERO      Full                                                      +---------+---------------+---------+-----------+----------+-------+  Left Venous Findings: +---+---------------+---------+-----------+----------+-------+      Compressibility Phasicity Spontaneity Properties Summary  +---+---------------+---------+-----------+----------+-------+  CFV Full            Yes       Yes                             +---+---------------+---------+-----------+----------+-------+    Summary: Right: There is  no evidence of deep vein thrombosis in the lower extremity. No cystic structure found in the popliteal fossa. Ultrasound characteristics of enlarged lymph nodes are noted in the groin. Left: No evidence of common femoral vein obstruction. Ultrasound characteristics of enlarged lymph nodes noted in the groin.  *See table(s) above for measurements and observations. Electronically signed by Ruta Hinds MD on 10/08/2018 at 8:49:33 PM.    Final    US Abdomen Limited Ruq  Result Date: 10/09/2018 CLINICAL DATA:  Abnormal LFTs EXAM: ULTRASOUND ABDOMEN LIMITED RIGHT UPPER QUADRANT COMPARISON:  None FINDINGS: Gallbladder: Incompletely distended. Mildly thickened gallbladder wall. No shadowing calculi, pericholecystic fluid or sonographic Murphy sign. Common bile duct: Diameter: 4 mm diameter, normal Liver: Normal appearance. No mass or nodularity. Portal vein is patent on color Doppler imaging with normal direction of blood flow towards the liver. No free fluid in RIGHT upper quadrant. IMPRESSION: Incompletely distended gallbladder, which could account for a minimally thickened gallbladder wall. Otherwise negative exam. Electronically Signed   By: Lavonia Dana M.D.   On: 10/09/2018 16:24      CBC Recent Labs  Lab 10/08/18 0258 10/09/18 0616 10/10/18 0414 10/12/18 0142  WBC 5.5 5.4 5.5 6.0  HGB 10.1* 10.0* 9.8* 10.0*  HCT 31.7* 31.8* 30.9* 32.5*  PLT 205 215 234 241  MCV 94.9 92.4 93.4 93.7  MCH 30.2 29.1 29.6 28.8  MCHC 31.9 31.4 31.7 30.8  RDW 12.7 12.9 13.3 13.3    Chemistries  Recent Labs  Lab 10/07/18 0329 10/08/18 0258 10/09/18 0616 10/10/18 0414 10/12/18 0142  NA 135 137 142 138 136  K 3.3* 3.4* 3.2* 3.6 3.4*  CL 104 105 109 107 106  CO2 '23 25 25 25 26  ' GLUCOSE 328* 174* 156* 167* 145*  BUN <5* 5* '8 9 15  ' CREATININE 0.70 0.63 0.59 0.61 0.65  CALCIUM 8.5* 8.6* 9.0 9.2 8.6*  MG 1.9  --   --   --   --   AST 55*  --  38 36  --   ALT 44  --  38 37  --   ALKPHOS 402*  --  366* 365*  --   BILITOT 0.8  --  1.0 1.2  --     Lab Results  Component Value Date   HGBA1C 11.0 (H) 10/07/2018   Coagulation profile Recent Labs  Lab 10/07/18 0329  INR 1.0   Roxan Hockey M.D on 10/12/2018 at 1:43 PM  Go to [email or web address] - for contact info  [facility] - Office  [phone]

## 2018-10-12 NOTE — Progress Notes (Signed)
[facility]  Date of Admission:  10/07/2018   Total days of antibiotics 28         ASSESSMENT/PLAN:  43yo female with MRSA bacteremia on 1 of 2 blood cultures with osteo of her right foot and ankle with Charcot food 2/2 diabetes. Repeat blood cultures 3/31 pending, waiting until 4/6 for PICC placement with six week Vanc planned. Diarrhea overnight, unlikely C. Diff as she has been on IV vanc, with only one dose ceftriaxone several days ago, and Vanc does have not have penetration to the GI tract.   - cont. 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)   Severe protein-calorie malnutrition (HCC)   Leg wound, right, sequela   Diabetic polyneuropathy associated with type 2 diabetes mellitus (HCC)   Cigarette smoker   Abnormal LFTs   Scheduled Meds:  amLODipine  5 mg Oral Daily   Chlorhexidine Gluconate Cloth  6 each Topical Q0600   feeding supplement (PRO-STAT SUGAR FREE 64)  30 mL Oral BID   heparin injection (subcutaneous)  5,000 Units Subcutaneous Q8H   insulin aspart  0-5 Units Subcutaneous QHS   insulin aspart  0-9 Units Subcutaneous TID WC   insulin aspart  4 Units Subcutaneous TID WC   insulin glargine  40 Units Subcutaneous BID   metoprolol tartrate  25 mg Oral BID   multivitamin with minerals  1 tablet Oral Daily   potassium chloride  40 mEq Oral Once   potassium chloride  40 mEq Oral Once   zinc sulfate  220 mg Oral Daily   Continuous Infusions:  vancomycin 1,000 mg (10/12/18 0505)   PRN Meds:.acetaminophen **OR** acetaminophen, bisacodyl, labetalol, senna-docusate, traMADol, zolpidem   SUBJECTIVE: States she has had a lot of diarrhea overnight, she is not sure  how many times. States it is watery and brown and she soiled herself twice. She denies abdominal pain, nausea, blood in her stool. She denies LE pain or swelling. She has been keeping her compression stockings in place.   Review of Systems: ROS ROS as noted in Assessment and Subjective   No Known Allergies  OBJECTIVE: Vitals:   10/12/18 0355 10/12/18 0817 10/12/18 1020 10/12/18 1247  BP: 124/87 124/85 (!) 126/91 (!) 149/109  Pulse: (!) 103 100 (!) 101 (!) 106  Resp: 18 16 15 16   Temp: 98.4 F (36.9 C) 98.3 F (36.8 C) 98 F (36.7 C) 98.2 F (36.8 C)  TempSrc: Oral Oral Oral Oral  SpO2: 96% 99% 100% 100%  Weight:      Height:       Body mass index is 27.44 kg/m.  Physical Exam Constitution: supine in bed, NAD  Eyes: no icterus or injection  Cardio: tachycardic, regular rhythm, no m/r/g  Abdominal: +BS, NTTP, soft, non-distended  MSK: No LE edema, moving all extremities  Neuro: a&o, normal affect  Skin: RLE wound extending to musculature, no surrounding erythema or warmth, no purulence   Lab Results Lab Results  Component Value Date   WBC 6.0 10/12/2018   HGB 10.0 (L) 10/12/2018   HCT 32.5 (L) 10/12/2018   MCV 93.7 10/12/2018   PLT 241 10/12/2018    Lab Results  Component Value Date  CREATININE 0.65 10/12/2018   BUN 15 10/12/2018   NA 136 10/12/2018   K 3.4 (L) 10/12/2018   CL 106 10/12/2018   CO2 26 10/12/2018    Lab Results  Component Value Date   ALT 37 10/10/2018   AST 36 10/10/2018   ALKPHOS 365 (H) 10/10/2018   BILITOT 1.2 10/10/2018     Microbiology: Recent Results (from the past 240 hour(s))  Culture, blood (routine x 2)     Status: None   Collection Time: 10/07/18  3:20 AM  Result Value Ref Range Status   Specimen Description BLOOD RIGHT ARM  Final   Special Requests   Final    BOTTLES DRAWN AEROBIC AND ANAEROBIC Blood Culture results may not be optimal due to an excessive volume of blood received in culture bottles   Culture   Final     NO GROWTH 5 DAYS Performed at [facility], [address] [place], [place] [address]    Report Status 10/12/2018 FINAL  Final  Culture, blood (routine x 2)     Status: Abnormal   Collection Time: 10/07/18  3:29 AM  Result Value Ref Range Status   Specimen Description BLOOD RIGHT HAND  Final   Special Requests   Final    BOTTLES DRAWN AEROBIC ONLY Blood Culture adequate volume   Culture  Setup Time   Final    GRAM POSITIVE COCCI IN CLUSTERS AEROBIC BOTTLE ONLY CRITICAL RESULT CALLED TO, READ BACK BY AND VERIFIED WITH: PHARMD AMELIE SAINTCLAIR AT 1038 ON 10/08/2018 BY MHOUEGNIFIO Performed at [facility], [address], [place], [place] [address]    Culture METHICILLIN RESISTANT STAPHYLOCOCCUS AUREUS (A)  Final   Report Status 10/10/2018 FINAL  Final   Organism ID, Bacteria METHICILLIN RESISTANT STAPHYLOCOCCUS AUREUS  Final      Susceptibility   Methicillin resistant staphylococcus aureus - MIC*    CIPROFLOXACIN >=8 RESISTANT Resistant     ERYTHROMYCIN >=8 RESISTANT Resistant     GENTAMICIN <=0.5 SENSITIVE Sensitive     OXACILLIN >=4 RESISTANT Resistant     TETRACYCLINE <=1 SENSITIVE Sensitive     VANCOMYCIN 1 SENSITIVE Sensitive     TRIMETH/SULFA <=10 SENSITIVE Sensitive     CLINDAMYCIN >=8 RESISTANT Resistant     RIFAMPIN <=0.5 SENSITIVE Sensitive     Inducible Clindamycin NEGATIVE Sensitive     * METHICILLIN RESISTANT STAPHYLOCOCCUS AUREUS  Blood Culture ID Panel (Reflexed)     Status: Abnormal   Collection Time: 10/07/18  3:29 AM  Result Value Ref Range Status   Enterococcus species NOT DETECTED NOT DETECTED Final   Listeria monocytogenes NOT DETECTED NOT DETECTED Final   Staphylococcus species DETECTED (A) NOT DETECTED Final    Comment: CRITICAL RESULT CALLED TO, READ BACK BY AND VERIFIED WITH: PHARMD AMELIE SAINTCLAIR AT 1038 ON 10/08/2018 BY MHOUEGNIFIO    Staphylococcus aureus (BCID) DETECTED (A) NOT DETECTED Final    Comment: Methicillin  (oxacillin)-resistant Staphylococcus aureus (MRSA). MRSA is predictably resistant to beta-lactam antibiotics (except ceftaroline). Preferred therapy is vancomycin unless clinically contraindicated. Patient requires contact precautions if  hospitalized. CRITICAL RESULT CALLED TO, READ BACK BY AND VERIFIED WITH: PHARMD AMELIE SAINTCLAIR AT 1038 ON 10/08/2018 BY MHOUEGNIFIO    Methicillin resistance DETECTED (A) NOT DETECTED Final    Comment: CRITICAL RESULT CALLED TO, READ BACK BY AND VERIFIED WITH: PHARMD AMELIE SAINTCLAIR AT 1038 ON 10/08/2018 BY MHOUEGNIFIO    Streptococcus species NOT DETECTED NOT DETECTED Final   Streptococcus agalactiae NOT DETECTED  NOT DETECTED Final   Streptococcus pneumoniae NOT DETECTED NOT DETECTED Final   Streptococcus pyogenes NOT DETECTED NOT DETECTED Final   Acinetobacter baumannii NOT DETECTED NOT DETECTED Final   Enterobacteriaceae species NOT DETECTED NOT DETECTED Final   Enterobacter cloacae complex NOT DETECTED NOT DETECTED Final   Escherichia coli NOT DETECTED NOT DETECTED Final   Klebsiella oxytoca NOT DETECTED NOT DETECTED Final   Klebsiella pneumoniae NOT DETECTED NOT DETECTED Final   Proteus species NOT DETECTED NOT DETECTED Final   Serratia marcescens NOT DETECTED NOT DETECTED Final   Haemophilus influenzae NOT DETECTED NOT DETECTED Final   Neisseria meningitidis NOT DETECTED NOT DETECTED Final   Pseudomonas aeruginosa NOT DETECTED NOT DETECTED Final   Candida albicans NOT DETECTED NOT DETECTED Final   Candida glabrata NOT DETECTED NOT DETECTED Final   Candida krusei NOT DETECTED NOT DETECTED Final   Candida parapsilosis NOT DETECTED NOT DETECTED Final   Candida tropicalis NOT DETECTED NOT DETECTED Final    Comment: Performed at [facility], [address], [place], [place] [address]  MRSA PCR Screening     Status: Abnormal   Collection Time: 10/07/18  5:34 AM  Result Value Ref Range Status   MRSA by PCR POSITIVE (A) NEGATIVE Final     Comment:        The GeneXpert MRSA Assay (FDA approved for NASAL specimens only), is one component of a comprehensive MRSA colonization surveillance program. It is not intended to diagnose MRSA infection nor to guide or monitor treatment for MRSA infections. RESULT CALLED TO, READ BACK BY AND VERIFIED WITH: B. DAVIS RN, AT [phone] 10/07/18 BY Renato Shin Performed at [facility], [address] [place], [place] [address]   Urine Culture     Status: Abnormal   Collection Time: 10/07/18 10:30 AM  Result Value Ref Range Status   Specimen Description URINE, RANDOM  Final   Special Requests NONE  Final   Culture (A)  Final    <10,000 COLONIES/mL INSIGNIFICANT GROWTH Performed at [facility], [address], [place], [place] [address]    Report Status 10/08/2018 FINAL  Final  Culture, blood (routine x 2)     Status: None (Preliminary result)   Collection Time: 10/09/18 10:21 AM  Result Value Ref Range Status   Specimen Description BLOOD RIGHT ANTECUBITAL  Final   Special Requests   Final    BOTTLES DRAWN AEROBIC ONLY Blood Culture adequate volume   Culture   Final    NO GROWTH 3 DAYS Performed at [facility], [address] [place], [place] [address]    Report Status PENDING  Incomplete  Culture, blood (routine x 2)     Status: None (Preliminary result)   Collection Time: 10/09/18 10:23 AM  Result Value Ref Range Status   Specimen Description BLOOD LEFT HAND  Final   Special Requests   Final    BOTTLES DRAWN AEROBIC ONLY Blood Culture results may not be optimal due to an inadequate volume of blood received in culture bottles   Culture   Final    NO GROWTH 3 DAYS Performed at [facility] [place] [facility], [address] [place], [place] [address]    Report Status PENDING  Incomplete    Iriel Nason A, DO 10/12/2018, 1:32 PM

## 2018-10-12 NOTE — Progress Notes (Signed)
PHARMACY CONSULT NOTE FOR:  OUTPATIENT  PARENTERAL ANTIBIOTIC THERAPY (OPAT)  Indication: MRSA bacteremia/endocarditis  Regimen: Vancomycin 1000 mg IV q 12 hours  End date: 11/20/2018  IV antibiotic discharge orders are pended. To discharging provider:  please sign these orders via discharge navigator,  Select New Orders & click on the button choice - Manage This Unsigned Work.     Thank you for allowing pharmacy to be a part of this patient's care.  Sharin Mons, PharmD, BCPS, BCIDP Infectious Diseases Clinical Pharmacist Phone: [phone] 10/12/2018, 1:54 PM

## 2018-10-13 LAB — COMPREHENSIVE METABOLIC PANEL
ALT: 28 U/L (ref 0–44)
AST: 30 U/L (ref 15–41)
Albumin: 2.4 g/dL — ABNORMAL LOW (ref 3.5–5.0)
Alkaline Phosphatase: 356 U/L — ABNORMAL HIGH (ref 38–126)
Anion gap: 7 (ref 5–15)
BUN: 14 mg/dL (ref 6–20)
CO2: 23 mmol/L (ref 22–32)
Calcium: 8.7 mg/dL — ABNORMAL LOW (ref 8.9–10.3)
Chloride: 109 mmol/L (ref 98–111)
Creatinine, Ser: 0.59 mg/dL (ref 0.44–1.00)
GFR calc Af Amer: >60 mL/min (ref >60)
GFR calc non Af Amer: >60 mL/min (ref >60)
Glucose, Bld: 272 mg/dL — ABNORMAL HIGH (ref 70–99)
Potassium: 3.9 mmol/L (ref 3.5–5.1)
Sodium: 139 mmol/L (ref 135–145)
Total Bilirubin: 1 mg/dL (ref 0.3–1.2)
Total Protein: 8.7 g/dL — ABNORMAL HIGH (ref 6.5–8.1)

## 2018-10-13 LAB — CBC
HCT: 31.1 % — ABNORMAL LOW (ref 36.0–46.0)
Hemoglobin: 9.9 g/dL — ABNORMAL LOW (ref 12.0–15.0)
MCH: 30 pg (ref 26.0–34.0)
MCHC: 31.8 g/dL (ref 30.0–36.0)
MCV: 94.2 fL (ref 80.0–100.0)
Platelets: 246 10*3/uL (ref 150–400)
RBC: 3.3 MIL/uL — ABNORMAL LOW (ref 3.87–5.11)
RDW: 13.4 % (ref 11.5–15.5)
WBC: 6.2 10*3/uL (ref 4.0–10.5)
nRBC: 0 % (ref 0.0–0.2)

## 2018-10-13 LAB — GLUCOSE, CAPILLARY
Glucose-Capillary: 207 mg/dL — ABNORMAL HIGH (ref 70–99)
Glucose-Capillary: 335 mg/dL — ABNORMAL HIGH (ref 70–99)
Glucose-Capillary: 281 mg/dL — ABNORMAL HIGH (ref 70–99)
Glucose-Capillary: 230 mg/dL — ABNORMAL HIGH (ref 70–99)

## 2018-10-13 NOTE — Plan of Care (Signed)

## 2018-10-13 NOTE — Progress Notes (Signed)
Physical Therapy Treatment Patient Details Name: Yolanda [name] MRN: [id number] DOB: 12-04-1975 Today's Date: 10/13/2018    History of Present Illness Yolanda [name] is a 43 y.o. female admitted for Diabetic foot ulcer with osteomyelitis. She has PMH of  uncontrolled diabetic  neuropathy who presents with a chronic wound/ulcer over the lateral aspect of the right leg.  Patient has been undergoing wound care and treatment for several months. She is NWB on Rt leg unless has boot.    PT Comments    Patient walked further today but did not do as well with weight bearing precautions. She was strongly advised to stay NWB but she reports it is too hard on her arms. She was in the boot. She had no increase in pain. Her endurance appears to be improving. Acute therapy will continue to work with the patient.    Follow Up Recommendations        Equipment Recommendations       Recommendations for Other Services       Precautions / Restrictions Precautions Precautions: Fall Restrictions Weight Bearing Restrictions: Yes RLE Weight Bearing: Non weight bearing    Mobility  Bed Mobility               General bed mobility comments: Patient found in a chair   Transfers Overall transfer level: Needs assistance Equipment used: Rolling walker (2 wheeled) Transfers: Sit to/from Stand Sit to Stand: Supervision            Ambulation/Gait Ambulation/Gait assistance: Supervision Gait Distance (Feet): 120 Feet Assistive device: Rolling walker (2 wheeled) Gait Pattern/deviations: Step-to pattern Gait velocity: decreased   General Gait Details: Patient did not do well maintaining Non weight bearing today. She reports it is too hard on her arms and tires her out too much. Sjhe was strongly advised to maintain Non weight bearing as much as possible.    Stairs             Wheelchair Mobility    Modified Rankin (Stroke Patients Only)       Balance Overall balance assessment:  Needs assistance Sitting-balance support: No upper extremity supported Sitting balance-Leahy Scale: Good     Standing balance support: Bilateral upper extremity supported Standing balance-Leahy Scale: Poor                              Cognition Arousal/Alertness: Awake/alert Behavior During Therapy: WFL for tasks assessed/performed Overall Cognitive Status: No family/caregiver present to determine baseline cognitive functioning Area of Impairment: Safety/judgement;Problem solving                             Problem Solving: Slow processing;Requires verbal cues General Comments: pt is likely at baseline       Exercises      General Comments        Pertinent Vitals/Pain Pain Assessment: Faces Faces Pain Scale: No hurt    Home Living                      Prior Function            PT Goals (current goals can now be found in the care plan section) Acute Rehab PT Goals Patient Stated Goal: return home    Frequency           PT Plan      Co-evaluation  AM-PAC PT "6 Clicks" Mobility   Outcome Measure  Help needed turning from your back to your side while in a flat bed without using bedrails?: None Help needed moving from lying on your back to sitting on the side of a flat bed without using bedrails?: None Help needed moving to and from a bed to a chair (including a wheelchair)?: None Help needed standing up from a chair using your arms (e.g., wheelchair or bedside chair)?: None Help needed to walk in hospital room?: A Little Help needed climbing 3-5 steps with a railing? : A Little 6 Click Score: 22    End of Session               Time: 0150-0210 PT Time Calculation (min) (ACUTE ONLY): 20 min  Charges:  $Therapeutic Exercise: 8-22 mins                        Dessie Coma PT DPT  10/13/2018, 4:06 PM

## 2018-10-13 NOTE — Progress Notes (Signed)
Patient Demographics:    Yolanda [name], is a 43 y.o. female, DOB - [birth year], NXG:[id number]  Admit date - 10/07/2018   Admitting Physician Allie Bossier, MD  Outpatient Primary MD for the patient is No primary care provider on file.  LOS - 6   No chief complaint on file.       Subjective:    [name] today has no fevers, no emesis,  No chest pain, less frequent BM, reduce volume of stools  Assessment  & Plan :    Principal Problem:   MRSA bacteremia Active Problems:   Osteomyelitis of ankle and foot ([place])   Diabetes mellitus type 2, uncontrolled, with complications ([place])   Charcot foot due to diabetes mellitus (HCC)   Severe protein-calorie malnutrition (HCC)   Leg wound, right, sequela   Diabetic polyneuropathy associated with type 2 diabetes mellitus (HCC)   Cigarette smoker   Abnormal LFTs  Brief Summary  43 y.o.  BF PMHx Uncontrolled Diabetes type 2 with complication, Rt Foot/Ankle diabetic foot ulcer/Charcot's foot, osteomyelitis and Celllulitis with MRSA Bacteremia as well  4 weeks PTA treated with iv Daptomycin (from 09/14/2018 thru 10/07/18) . Was followed by surgery at [facility] who contacted Dr Sharol Given from orthopedic surgery who agreed to see patient in consult .  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 Failed Daptomycin (blood cultures remain positive despite more than 3 weeks of IV daptomycin ((from 09/14/2018 thru 10/07/18) ),   TTE with suspicion of endocarditis, cardiology reluctant to do TEE given current COVID-19 situation bacteremia persisted despite IV daptomycin, ID recommends IV vancomycin for 6 weeks from first negative culture (10/09/18)...... Previous Lt Arm PICC line removed, --- Patient was on daptomycin at the outside facility, started on 09/14/2018, supposed to  finish on 4/10 for total 6 weeks. PICC line in left arm Removed due to +ve Repeat blood cultures done here from 10/07/2018+ for MRSA, repeat blood culture from 10/09/2018 NGTD, afebrile blood cultures remain negative may replace PICC line on 10/15/2018--..the patient will Need-IV vancomycin twice a day at short stay at [facility], they will need order to continue IV antibiotics and any medication changes. Fax number is [phone]  2)Diabetes type 2 uncontrolled with complication--.  A1c this admission 11.0 reflecting uncontrolled DM, Use Novolog/Humalog Sliding scale insulin with Accu-Cheks/Fingersticks as ordered, continue Lantus insulin 40 units twice daily, change NovoLog insulin to 4 units 3 times daily with meals  3)HTN----stable, c/n Amlodipine 5 mg daily, metoprolol 25 mg bid,  may use IV labetalol  10 mg when necessary  Every 4 hours for systolic blood pressure over 165 mmhg  4)Osteomyelitis RIGHT foot/ankle with cellulitis and Charcot Foot--- Dr. Sharol Given recommends no surgical intervention at this time, continue IV vancomycin , plan is for PICC line placement on 10/15/2018 if negative Repeat blood cultures  5)Right lower extremity DVT, diagnosed in 07/2018 -Patient was on Xarelto, completed starter pack however Xarelto was held due to bleeding issues at [facility] with debridement. -Right lower extremity venous Dopplers negative for DVT, will hold off on Xarelto  6)Elevated alk phos-----Unclear etiology, AST down to 30, ALT down to 28, total bili  1.0 , daptomycin also causes abnormal LFTs, -Alk phos trending down to 356 from above 400 on admission,  - right upper quadrant ultrasound without acute findings, GGT, AMA, ANA, SMA mostly unremarkable  7)Severe protein calorie malnutrition--Albumin 2.2 -Placed on pro-stat, zinc daily  8)Diarrhea------ per ID physician C. difficile less likely given that patient has been mostly on daptomycin and vancomycin, vancomycin does not  really penetrate GI tract well... Continue to monitor and treat empirically ----frequency and volume of stools appears to be improving... Patient remains afebrile without abdominal pain  Disposition/Need for in-Hospital Stay- patient unable to be discharged at this time due to patient will need PICC line replaced if blood cultures are negative on 10/15/2018 and possible discharge home on IV vancomycin twice a day to be administered by home health and family members--- Please see OPAT orders  Code Status : Full Code  Family Communication:   None at bedside   Disposition Plan  : Home with home health IV vancomycin-- see OPAT orders OPAT Orders Discharge antibiotics: Vancomycin per pharmacy protocol  Aim for Vancomycin trough 15-20 (unless otherwise indicated) Duration: 6 weeks End Date:  May 12th, 2020  [place] Per Protocol:  Labs BI- weekly while on IV antibiotics:  _x_ BMP w GFR  Labs weekly while on IV antibiotics: _x_ CBC with differential  x__ CRP x__ ESR  _x_ Vancomycin trough   x__ Please pull PIC at completion of IV antibiotics __ Please leave PIC in place until doctor has seen patient or been notified  Fax weekly labs to [phone]  She has an EVISIT followup with Dr. Tommy Medal on May 11th, 2020 @ 1045.    Consults  :  ID/ortho   DVT Prophylaxis  :   - Heparin -    Lab Results  Component Value Date   PLT 246 10/13/2018    Inpatient Medications  Scheduled Meds:  amLODipine  5 mg Oral Daily   feeding supplement (PRO-STAT SUGAR FREE 64)  30 mL Oral BID   heparin injection (subcutaneous)  5,000 Units Subcutaneous Q8H   insulin aspart  0-5 Units Subcutaneous QHS   insulin aspart  0-9 Units Subcutaneous TID WC   insulin aspart  4 Units Subcutaneous TID WC   insulin glargine  40 Units Subcutaneous BID   metoprolol tartrate  25 mg Oral BID   multivitamin with minerals  1 tablet Oral Daily   potassium chloride  40 mEq Oral Once    zinc sulfate  220 mg Oral Daily   Continuous Infusions:  vancomycin 1,000 mg (10/13/18 0602)   PRN Meds:.acetaminophen **OR** acetaminophen, bisacodyl, hydrOXYzine, labetalol, senna-docusate, traMADol, zolpidem   Anti-infectives (From admission, onward)   Start     Dose/Rate Route Frequency Ordered Stop   10/11/18 1800  vancomycin (VANCOCIN) 1,000 mg in sodium chloride 0.9 % 250 mL IVPB     1,000 mg 250 mL/hr over 60 Minutes Intravenous Every 12 hours 10/11/18 0859     10/09/18 1800  vancomycin (VANCOCIN) 1,250 mg in sodium chloride 0.9 % 250 mL IVPB  Status:  Discontinued     1,250 mg 166.7 mL/hr over 90 Minutes Intravenous Every 12 hours 10/09/18 0831 10/11/18 0859   10/08/18 1745  vancomycin (VANCOCIN) 1,000 mg in sodium chloride 0.9 % 250 mL IVPB  Status:  Discontinued     1,000 mg 250 mL/hr over 60 Minutes Intravenous Every 12 hours 10/08/18 1315 10/09/18 0831   10/08/18 1030  DAPTOmycin (CUBICIN) 600 mg in sodium chloride  0.9 % IVPB     600 mg 224 mL/hr over 30 Minutes Intravenous Daily 10/08/18 1026 10/08/18 1149   10/08/18 0000  daptomycin (CUBICIN) IVPB  Status:  Discontinued     600 mg Intravenous Every 24 hours 10/08/18 0839 10/08/18    10/07/18 1400  vancomycin (VANCOCIN) 1,000 mg in sodium chloride 0.9 % 250 mL IVPB  Status:  Discontinued     1,000 mg 250 mL/hr over 60 Minutes Intravenous Every 8 hours 10/07/18 0351 10/08/18 0829   10/07/18 0430  vancomycin (VANCOCIN) 1,500 mg in sodium chloride 0.9 % 500 mL IVPB     1,500 mg 250 mL/hr over 120 Minutes Intravenous  Once 10/07/18 0351 10/07/18 0835   10/07/18 0430  piperacillin-tazobactam (ZOSYN) IVPB 3.375 g  Status:  Discontinued     3.375 g 12.5 mL/hr over 240 Minutes Intravenous Every 8 hours 10/07/18 0351 10/08/18 0829        Objective:   Vitals:   10/12/18 1247 10/12/18 2110 10/12/18 2300 10/13/18 0621  BP: (!) 149/109 (!) 140/100 111/77 113/81  Pulse: (!) 106 (!) 105 (!) 106 93  Resp: _0 Temp:  98.2 F (36.8 C) 98.4 F (36.9 C)  98.1 F (36.7 C)  TempSrc: Oral Oral  Oral  SpO2: 100% 100% 99% 100%  Weight:      Height:        Wt Readings from Last 3 Encounters:  10/07/18 77.1 kg    Intake/Output Summary (Last 24 hours) at 10/13/2018 0954 Last data filed at 10/12/2018 1500 Gross per 24 hour  Intake 480 ml  Output --  Net 480 ml   Physical Exam Patient is examined daily including today on 10/13/18 , exams remain the same as of yesterday except that has changed   Gen:- Awake Alert,  In no apparent distress  HEENT:- [place].AT, No sclera icterus Neck-Supple Neck,No JVD,.  Lungs-  CTAB , fair symmetrical air movement CV- S1, S2 normal, regular  Abd-  +ve B.Sounds, Abd Soft, No tenderness, no CVA area tenderness    Extremity/Skin:-  pedal pulses present/-- Wound on fibular side of right leg appears clean and dry with good granulation tissue. Continues to wear compression sock.   Psych-affect is appropriate, oriented x3 Neuro-no new focal deficits, no tremors   Data Review:   Micro Results Recent Results (from the past 240 hour(s))  Culture, blood (routine x 2)     Status: None   Collection Time: 10/07/18  3:20 AM  Result Value Ref Range Status   Specimen Description BLOOD RIGHT ARM  Final   Special Requests   Final    BOTTLES DRAWN AEROBIC AND ANAEROBIC Blood Culture results may not be optimal due to an excessive volume of blood received in culture bottles   Culture   Final    NO GROWTH 5 DAYS Performed at [facility], [place] [address], [place], [place] [address]    Report Status 10/12/2018 FINAL  Final  Culture, blood (routine x 2)     Status: Abnormal   Collection Time: 10/07/18  3:29 AM  Result Value Ref Range Status   Specimen Description BLOOD RIGHT HAND  Final   Special Requests   Final    BOTTLES DRAWN AEROBIC ONLY Blood Culture adequate volume   Culture  Setup Time   Final    GRAM POSITIVE COCCI IN CLUSTERS AEROBIC BOTTLE ONLY CRITICAL RESULT CALLED TO,  READ BACK BY AND VERIFIED WITH: [place] AT  1038 ON 10/08/2018 BY MHOUEGNIFIO Performed at [facility], [place] [address], [place], [place] [address]    Culture METHICILLIN RESISTANT STAPHYLOCOCCUS AUREUS (A)  Final   Report Status 10/10/2018 FINAL  Final   Organism ID, Bacteria METHICILLIN RESISTANT STAPHYLOCOCCUS AUREUS  Final      Susceptibility   Methicillin resistant staphylococcus aureus - MIC*    CIPROFLOXACIN >=8 RESISTANT Resistant     ERYTHROMYCIN >=8 RESISTANT Resistant     GENTAMICIN <=0.5 SENSITIVE Sensitive     OXACILLIN >=4 RESISTANT Resistant     TETRACYCLINE <=1 SENSITIVE Sensitive     VANCOMYCIN 1 SENSITIVE Sensitive     TRIMETH/SULFA <=10 SENSITIVE Sensitive     CLINDAMYCIN >=8 RESISTANT Resistant     RIFAMPIN <=0.5 SENSITIVE Sensitive     Inducible Clindamycin NEGATIVE Sensitive     * METHICILLIN RESISTANT STAPHYLOCOCCUS AUREUS  Blood Culture ID Panel (Reflexed)     Status: Abnormal   Collection Time: 10/07/18  3:29 AM  Result Value Ref Range Status   Enterococcus species NOT DETECTED NOT DETECTED Final   Listeria monocytogenes NOT DETECTED NOT DETECTED Final   Staphylococcus species DETECTED (A) NOT DETECTED Final    Comment: CRITICAL RESULT CALLED TO, READ BACK BY AND VERIFIED WITH: PHARMD AMELIE SAINTCLAIR AT 1038 ON 10/08/2018 BY MHOUEGNIFIO    Staphylococcus aureus (BCID) DETECTED (A) NOT DETECTED Final    Comment: Methicillin (oxacillin)-resistant Staphylococcus aureus (MRSA). MRSA is predictably resistant to beta-lactam antibiotics (except ceftaroline). Preferred therapy is vancomycin unless clinically contraindicated. Patient requires contact precautions if  hospitalized. CRITICAL RESULT CALLED TO, READ BACK BY AND VERIFIED WITH: [place] AT 2725 ON 10/08/2018 BY MHOUEGNIFIO    Methicillin resistance DETECTED (A) NOT DETECTED Final    Comment: CRITICAL RESULT CALLED TO, READ BACK BY AND VERIFIED WITH: [place] AT 3664 ON 10/08/2018 BY MHOUEGNIFIO    Streptococcus species NOT DETECTED NOT DETECTED Final   Streptococcus agalactiae NOT DETECTED NOT DETECTED Final   Streptococcus pneumoniae NOT DETECTED NOT DETECTED Final   Streptococcus pyogenes NOT DETECTED NOT DETECTED Final   Acinetobacter baumannii NOT DETECTED NOT DETECTED Final   Enterobacteriaceae species NOT DETECTED NOT DETECTED Final   Enterobacter cloacae complex NOT DETECTED NOT DETECTED Final   Escherichia coli NOT DETECTED NOT DETECTED Final   Klebsiella oxytoca NOT DETECTED NOT DETECTED Final   Klebsiella pneumoniae NOT DETECTED NOT DETECTED Final   Proteus species NOT DETECTED NOT DETECTED Final   Serratia marcescens NOT DETECTED NOT DETECTED Final   Haemophilus influenzae NOT DETECTED NOT DETECTED Final   Neisseria meningitidis NOT DETECTED NOT DETECTED Final   Pseudomonas aeruginosa NOT DETECTED NOT DETECTED Final   Candida albicans NOT DETECTED NOT DETECTED Final   Candida glabrata NOT DETECTED NOT DETECTED Final   Candida krusei NOT DETECTED NOT DETECTED Final   Candida parapsilosis NOT DETECTED NOT DETECTED Final   Candida tropicalis NOT DETECTED NOT DETECTED Final    Comment: Performed at [facility], [place] [address], [place], [place] [address]  MRSA PCR Screening     Status: Abnormal   Collection Time: 10/07/18  5:34 AM  Result Value Ref Range Status   MRSA by PCR POSITIVE (A) NEGATIVE Final    Comment:        The GeneXpert MRSA Assay (FDA approved for NASAL specimens only), is one component of a comprehensive MRSA colonization surveillance program. It is not intended to diagnose MRSA infection nor to guide or monitor treatment for  MRSA infections. RESULT CALLED TO, READ BACK BY AND VERIFIED WITH: B. DAVIS RN, AT [phone] 10/07/18 BY Rush Landmark Performed at [facility], [place] [address], [place], [place] [address]   Urine Culture     Status: Abnormal   Collection Time: 10/07/18 10:30 AM  Result  Value Ref Range Status   Specimen Description URINE, RANDOM  Final   Special Requests NONE  Final   Culture (A)  Final    <10,000 COLONIES/mL INSIGNIFICANT GROWTH Performed at [facility] [address] [place], [place] [address]    Report Status 10/08/2018 FINAL  Final  Culture, blood (routine x 2)     Status: None (Preliminary result)   Collection Time: 10/09/18 10:21 AM  Result Value Ref Range Status   Specimen Description BLOOD RIGHT ANTECUBITAL  Final   Special Requests   Final    BOTTLES DRAWN AEROBIC ONLY Blood Culture adequate volume   Culture   Final    NO GROWTH 3 DAYS Performed at [facility], [place] [address] [place], [place] [address]    Report Status PENDING  Incomplete  Culture, blood (routine x 2)     Status: None (Preliminary result)   Collection Time: 10/09/18 10:23 AM  Result Value Ref Range Status   Specimen Description BLOOD LEFT HAND  Final   Special Requests   Final    BOTTLES DRAWN AEROBIC ONLY Blood Culture results may not be optimal due to an inadequate volume of blood received in culture bottles   Culture   Final    NO GROWTH 3 DAYS Performed at [facility], [place] [address] [place], [place] [address]    Report Status PENDING  Incomplete    Radiology Reports Dg Chest 1 View  Result Date: 10/07/2018 CLINICAL DATA:  MRSA bacteremia, PICC line placement EXAM: CHEST  1 VIEW COMPARISON:  None. FINDINGS: Lungs are clear.  No pleural effusion or pneumothorax. The heart is normal in size. Left arm PICC terminates in the upper SVC, 4.5 cm above the cavoatrial junction. IMPRESSION: Left arm PICC terminates in the upper SVC, 4.5 cm above the cavoatrial junction. Electronically Signed   By: Julian Hy M.D.   On: 10/07/2018 06:05   Dg Humerus Left  Result Date: 10/07/2018 CLINICAL DATA:  MRSA bacteremia, status post PICC line placement EXAM: LEFT HUMERUS - 2+ VIEW COMPARISON:  None. FINDINGS: No fracture or dislocation is seen. The  joint spaces are preserved. The visualized soft tissues are unremarkable. Visualized left lung is clear. Left arm PICC courses through the axillary region into the chest. IMPRESSION: Negative. Left arm PICC courses through the axillary region into the chest. Electronically Signed   By: Julian Hy M.D.   On: 10/07/2018 06:04   Vas [place] Lower Extremity Venous (dvt)  Result Date: 10/08/2018  Lower Venous Study Indications: Follow up DVT, off anticoagulants due to bleeding.  Performing Technologist: June Leap RDMS, RVT  Examination Guidelines: A complete evaluation includes B-mode imaging, spectral Doppler, color Doppler, and power Doppler as needed of all accessible portions of each vessel. Bilateral testing is considered an integral part of a complete examination. Limited examinations for reoccurring indications may be performed as noted.  Right Venous Findings: +---------+---------------+---------+-----------+----------+-------+            Compressibility Phasicity Spontaneity Properties Summary  +---------+---------------+---------+-----------+----------+-------+  CFV       Full            Yes  Yes                             +---------+---------------+---------+-----------+----------+-------+  SFJ       Full                                                      +---------+---------------+---------+-----------+----------+-------+  FV Prox   Full                                                      +---------+---------------+---------+-----------+----------+-------+  FV Mid    Full                                                      +---------+---------------+---------+-----------+----------+-------+  FV Distal Full                                                      +---------+---------------+---------+-----------+----------+-------+  PFV       Full                                                      +---------+---------------+---------+-----------+----------+-------+  POP       Full            Yes        Yes                             +---------+---------------+---------+-----------+----------+-------+  PTV       Full                                                      +---------+---------------+---------+-----------+----------+-------+  PERO      Full                                                      +---------+---------------+---------+-----------+----------+-------+  Left Venous Findings: +---+---------------+---------+-----------+----------+-------+      Compressibility Phasicity Spontaneity Properties Summary  +---+---------------+---------+-----------+----------+-------+  CFV Full            Yes       Yes                             +---+---------------+---------+-----------+----------+-------+    Summary: Right: There is no evidence of deep vein thrombosis in  the lower extremity. No cystic structure found in the popliteal fossa. Ultrasound characteristics of enlarged lymph nodes are noted in the groin. Left: No evidence of common femoral vein obstruction. Ultrasound characteristics of enlarged lymph nodes noted in the groin.  *See table(s) above for measurements and observations. Electronically signed by Ruta Hinds MD on 10/08/2018 at 8:49:33 PM.    Final    US Abdomen Limited Ruq  Result Date: 10/09/2018 CLINICAL DATA:  Abnormal LFTs EXAM: ULTRASOUND ABDOMEN LIMITED RIGHT UPPER QUADRANT COMPARISON:  None FINDINGS: Gallbladder: Incompletely distended. Mildly thickened gallbladder wall. No shadowing calculi, pericholecystic fluid or sonographic Murphy sign. Common bile duct: Diameter: 4 mm diameter, normal Liver: Normal appearance. No mass or nodularity. Portal vein is patent on color Doppler imaging with normal direction of blood flow towards the liver. No free fluid in RIGHT upper quadrant. IMPRESSION: Incompletely distended gallbladder, which could account for a minimally thickened gallbladder wall. Otherwise negative exam. Electronically Signed   By: Lavonia Dana M.D.   On: 10/09/2018  16:24     CBC Recent Labs  Lab 10/08/18 0258 10/09/18 0616 10/10/18 0414 10/12/18 0142 10/13/18 0151  WBC 5.5 5.4 5.5 6.0 6.2  HGB 10.1* 10.0* 9.8* 10.0* 9.9*  HCT 31.7* 31.8* 30.9* 32.5* 31.1*  PLT 205 215 234 241 246  MCV 94.9 92.4 93.4 93.7 94.2  MCH 30.2 29.1 29.6 28.8 30.0  MCHC 31.9 31.4 31.7 30.8 31.8  RDW 12.7 12.9 13.3 13.3 13.4    Chemistries  Recent Labs  Lab 10/07/18 0329 10/08/18 0258 10/09/18 0616 10/10/18 0414 10/12/18 0142 10/13/18 0151  NA 135 137 142 138 136 139  K 3.3* 3.4* 3.2* 3.6 3.4* 3.9  CL 104 105 109 107 106 109  CO2 _0 GLUCOSE 328* 174* 156* 167* 145* 272*  BUN <5* 5* _1 CREATININE 0.70 0.63 0.59 0.61 0.65 0.59  CALCIUM 8.5* 8.6* 9.0 9.2 8.6* 8.7*  MG 1.9  --   --   --   --   --   AST 55*  --  38 36  --  30  ALT 44  --  38 37  --  28  ALKPHOS 402*  --  366* 365*  --  356*  BILITOT 0.8  --  1.0 1.2  --  1.0    Lab Results  Component Value Date   HGBA1C 11.0 (H) 10/07/2018   Coagulation profile Recent Labs  Lab 10/07/18 0329  INR 1.0   Roxan Hockey M.D on 10/13/2018 at 9:54 AM  Go to [email or web address] - for contact info  [facility] - Office  [phone]

## 2018-10-14 LAB — CULTURE, BLOOD (ROUTINE X 2)
Culture: NO GROWTH
Culture: NO GROWTH
Special Requests: ADEQUATE

## 2018-10-14 LAB — GLUCOSE, CAPILLARY
Glucose-Capillary: 80 mg/dL (ref 70–99)
Glucose-Capillary: 230 mg/dL — ABNORMAL HIGH (ref 70–99)
Glucose-Capillary: 210 mg/dL — ABNORMAL HIGH (ref 70–99)
Glucose-Capillary: 126 mg/dL — ABNORMAL HIGH (ref 70–99)

## 2018-10-14 MED ORDER — HYDROXYZINE HCL 25 MG PO TABS
25.0000 mg | ORAL_TABLET | Freq: Every day | ORAL | Status: DC
  Administered 2018-10-14: 25 mg via ORAL
  Filled 2018-10-14: qty 1

## 2018-10-14 MED ORDER — HYDROXYZINE HCL 25 MG PO TABS
25.0000 mg | ORAL_TABLET | Freq: Two times a day (BID) | ORAL | Status: DC
  Administered 2018-10-14 – 2018-10-15 (×3): 25 mg via ORAL
  Filled 2018-10-14 (×3): qty 1

## 2018-10-14 MED ORDER — HYDROXYZINE HCL 25 MG PO TABS: 25.0000 mg | ORAL_TABLET | Freq: Two times a day (BID) | ORAL | Status: DC

## 2018-10-14 MED ORDER — LOPERAMIDE HCL 2 MG PO CAPS
2.0000 mg | ORAL_CAPSULE | Freq: Once | ORAL | Status: AC
  Administered 2018-10-14: 2 mg via ORAL
  Filled 2018-10-14: qty 1

## 2018-10-14 MED ORDER — HYDROCORTISONE 1 % EX CREA
TOPICAL_CREAM | CUTANEOUS | Status: DC | PRN
  Administered 2018-10-14: 1 via TOPICAL
  Filled 2018-10-14 (×4): qty 28

## 2018-10-14 NOTE — Progress Notes (Signed)
Patient Demographics:    Yolanda [name], is a 43 y.o. female, DOB - [birth year], BBC:[id number]  Admit date - 10/07/2018   Admitting Physician Yolanda Bossier, MD  Outpatient Primary MD for the patient is No primary care provider on file.  LOS - 7   No chief complaint on file.       Subjective:    Yolanda [name] today has no fevers, no emesis,  No chest pain, diarrhea appears to be improving, blood cultures from 10/09/2018 remain negative, ambulated with physical therapist, complains of pruritus--will treat with Atarax  Assessment  & Plan :    Principal Problem:   MRSA bacteremia Active Problems:   Osteomyelitis of ankle and foot ([place])   Diabetes mellitus type 2, uncontrolled, with complications ([place])   Charcot foot due to diabetes mellitus ([place])   Severe protein-calorie malnutrition ([place])   Leg wound, right, sequela   Diabetic polyneuropathy associated with type 2 diabetes mellitus ([place])   Cigarette smoker   Abnormal LFTs  Brief Summary  43 y.o.  BF PMHx Uncontrolled Diabetes type 2 with complication, Rt Foot/Ankle diabetic foot ulcer/Charcot's foot, osteomyelitis and Celllulitis with MRSA Bacteremia as well  4 weeks PTA treated with iv Daptomycin (from 09/14/2018 thru 10/07/18) . Was followed by surgery at [facility] who contacted Dr Sharol Given from orthopedic surgery who agreed to see patient in consult .  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 Failed Daptomycin (blood cultures remain positive despite more than 3 weeks of IV daptomycin ((from 09/14/2018 thru 10/07/18) ),   TTE with suspicion of endocarditis, cardiology reluctant to do TEE given current COVID-19 situation bacteremia persisted despite IV daptomycin, ID recommends IV vancomycin for 6 weeks from first negative culture (10/09/18)......  Previous Lt Arm PICC line removed, --- Patient was on daptomycin at the outside facility, started on 09/14/2018, supposed to finish on 4/10 for total 6 weeks. PICC line in left arm Removed due to +ve Repeat blood cultures done here from 10/07/2018+ for MRSA, repeat blood culture from 10/09/2018 NGTD, afebrile blood cultures remain negative may replace PICC line on 10/15/2018--..the patient will Need-IV vancomycin twice a day at short stay at [facility], they will need order to continue IV antibiotics and any medication changes. Fax number is [phone]  2)Diabetes type 2 uncontrolled with complication--.  A1c this admission 11.0 reflecting uncontrolled DM, Use Novolog/Humalog Sliding scale insulin with Accu-Cheks/Fingersticks as ordered, continue Lantus insulin 40 units twice daily, change NovoLog insulin to 4 units 3 times daily with meals  3)HTN----stable, c/n Amlodipine 5 mg daily, metoprolol 25 mg bid,  may use IV labetalol  10 mg when necessary  Every 4 hours for systolic blood pressure over 165 mmhg  4)Osteomyelitis RIGHT foot/ankle with cellulitis and Charcot Foot--- Dr. Sharol Given recommends no surgical intervention at this time, continue IV vancomycin , plan is for PICC line placement on 10/15/2018 if negative Repeat blood cultures  5)Right lower extremity DVT, diagnosed in 07/2018 -Patient was on Xarelto, completed starter pack however Xarelto was held due to bleeding issues at [facility] with debridement. -Right lower extremity venous Dopplers negative for DVT, will hold off on Xarelto  6)Elevated alk phos-----Unclear etiology, AST down to 30, ALT down to 28, total bili 1.0 , daptomycin also causes abnormal LFTs, -Alk phos trending down to 356 from above 400 on admission,  - right upper quadrant ultrasound without acute findings, GGT, AMA, ANA, SMA mostly unremarkable  7)Severe protein calorie malnutrition--Albumin 2.2 -Placed on pro-stat, zinc daily  8)Diarrhea------  frequency and volume of stools continues to improve, per ID physician C. difficile less likely given that patient has been mostly on daptomycin and vancomycin, vancomycin does not really penetrate GI tract well... .. Patient remains afebrile without abdominal pain,   Disposition/Need for in-Hospital Stay- patient unable to be discharged at this time due to patient will need PICC line replaced if blood cultures are negative on 10/15/2018 and possible discharge home on IV vancomycin twice a day to be administered by home health and family members--- Please see OPAT orders  Code Status : Full Code  Family Communication:   None at bedside   Disposition Plan  : Home with home health IV vancomycin-- see OPAT orders OPAT Orders Discharge antibiotics: Vancomycin per pharmacy protocol  Aim for Vancomycin trough 15-20 (unless otherwise indicated) Duration: 6 weeks End Date:  May 12th, 2020  [place] Per Protocol:  Labs BI- weekly while on IV antibiotics:  _x_ BMP w GFR  Labs weekly while on IV antibiotics: _x_ CBC with differential  x__ CRP x__ ESR  _x_ Vancomycin trough   x__ Please pull PIC at completion of IV antibiotics __ Please leave PIC in place until doctor has seen patient or been notified  Fax weekly labs to [phone]  She has an EVISIT followup with Dr. Tommy Medal on May 11th, 2020 @ 1045.    Consults  :  ID/ortho   DVT Prophylaxis  :   - Heparin -    Lab Results  Component Value Date   PLT 246 10/13/2018    Inpatient Medications  Scheduled Meds:  amLODipine  5 mg Oral Daily   feeding supplement (PRO-STAT SUGAR FREE 64)  30 mL Oral BID   heparin injection (subcutaneous)  5,000 Units Subcutaneous Q8H   hydrOXYzine  25 mg Oral QHS   hydrOXYzine  25 mg Oral BID   insulin aspart  0-5 Units Subcutaneous QHS   insulin aspart  0-9 Units Subcutaneous TID WC   insulin aspart  4 Units Subcutaneous TID WC   insulin glargine  40 Units  Subcutaneous BID   loperamide  2 mg Oral Once   metoprolol tartrate  25 mg Oral BID   multivitamin with minerals  1 tablet Oral Daily   potassium chloride  40 mEq Oral Once   zinc sulfate  220 mg Oral Daily   Continuous Infusions:  vancomycin 1,000 mg (10/14/18 0510)   PRN Meds:.acetaminophen **OR** acetaminophen, bisacodyl, hydrocortisone cream, labetalol, senna-docusate, traMADol, zolpidem   Anti-infectives (From admission, onward)   Start     Dose/Rate Route Frequency Ordered Stop   10/11/18 1800  vancomycin (VANCOCIN) 1,000 mg in sodium chloride 0.9 % 250 mL IVPB     1,000 mg 250 mL/hr over 60 Minutes Intravenous Every 12 hours 10/11/18 0859     10/09/18 1800  vancomycin (VANCOCIN) 1,250 mg in sodium chloride 0.9 % 250 mL IVPB  Status:  Discontinued     1,250 mg 166.7 mL/hr over 90 Minutes Intravenous Every 12 hours 10/09/18 0831 10/11/18 0859   10/08/18 1745  vancomycin (VANCOCIN) 1,000 mg in sodium chloride 0.9 % 250 mL IVPB  Status:  Discontinued     1,000 mg 250 mL/hr over 60 Minutes Intravenous Every 12 hours 10/08/18 1315 10/09/18 0831   10/08/18 1030  DAPTOmycin (CUBICIN) 600 mg in sodium chloride 0.9 % IVPB     600 mg 224 mL/hr over 30 Minutes Intravenous Daily 10/08/18 1026 10/08/18 1149   10/08/18 0000  daptomycin (CUBICIN) IVPB  Status:  Discontinued     600 mg Intravenous Every 24 hours 10/08/18 0839 10/08/18    10/07/18 1400  vancomycin (VANCOCIN) 1,000 mg in sodium chloride 0.9 % 250 mL IVPB  Status:  Discontinued     1,000 mg 250 mL/hr over 60 Minutes Intravenous Every 8 hours 10/07/18 0351 10/08/18 0829   10/07/18 0430  vancomycin (VANCOCIN) 1,500 mg in sodium chloride 0.9 % 500 mL IVPB     1,500 mg 250 mL/hr over 120 Minutes Intravenous  Once 10/07/18 0351 10/07/18 0835   10/07/18 0430  piperacillin-tazobactam (ZOSYN) IVPB 3.375 g  Status:  Discontinued     3.375 g 12.5 mL/hr over 240 Minutes Intravenous Every 8 hours 10/07/18 0351 10/08/18 0829         Objective:   Vitals:   10/12/18 2300 10/13/18 0621 10/13/18 1436 10/13/18 2006  BP: 111/77 113/81 111/81 110/80  Pulse: (!) 106 93 (!) 103 100  Resp: '16 14 16 18  ' Temp:  98.1 F (36.7 C) 97.8 F (36.6 C) 98.4 F (36.9 C)  TempSrc:  Oral Oral Oral  SpO2: 99% 100% 100% 100%  Weight:      Height:        Wt Readings from Last 3 Encounters:  10/07/18 77.1 kg    Intake/Output Summary (Last 24 hours) at 10/14/2018 0948 Last data filed at 10/13/2018 1005 Gross per 24 hour  Intake 237 ml  Output --  Net 237 ml   Physical Exam Patient is examined daily including today on 10/14/18 , exams remain the same as of yesterday except that has changed   Gen:- Awake Alert,  In no apparent distress  HEENT:- [place].AT, No sclera icterus Neck-Supple Neck,No JVD,.  Lungs-  CTAB , fair symmetrical air movement CV- S1, S2 normal, regular  Abd-  +ve B.Sounds, Abd Soft, No tenderness, no CVA area tenderness    Extremity/Skin:-  pedal pulses present/-- Wound on fibular side of right leg appears clean and dry with good granulation tissue. Continues to wear compression sock.   Psych-affect is appropriate, oriented x3 Neuro-no new focal deficits, no tremors   Data Review:   Micro Results Recent Results (from the past 240 hour(s))  Culture, blood (routine x 2)     Status: None   Collection Time: 10/07/18  3:20 AM  Result Value Ref Range Status   Specimen Description BLOOD RIGHT ARM  Final   Special Requests   Final    BOTTLES DRAWN AEROBIC AND ANAEROBIC Blood Culture results may not be optimal due to an excessive volume of blood received in culture bottles   Culture   Final    NO GROWTH 5 DAYS Performed at [facility], [place] [address] [place], [place] [address]    Report Status 10/12/2018 FINAL  Final  Culture, blood (routine x 2)     Status: Abnormal   Collection Time: 10/07/18  3:29 AM  Result Value Ref Range Status   Specimen Description BLOOD RIGHT HAND  Final   Special Requests    Final    BOTTLES DRAWN AEROBIC ONLY Blood Culture adequate volume   Culture  Setup Time   Final    GRAM POSITIVE COCCI IN CLUSTERS AEROBIC BOTTLE ONLY CRITICAL RESULT CALLED TO, READ BACK BY AND VERIFIED WITH: [employer] AT 7062 ON 10/08/2018 BY MHOUEGNIFIO Performed at [facility], [place] [address], [place], [place] [address]    Culture METHICILLIN RESISTANT STAPHYLOCOCCUS AUREUS (A)  Final   Report Status 10/10/2018 FINAL  Final   Organism ID, Bacteria METHICILLIN RESISTANT STAPHYLOCOCCUS AUREUS  Final      Susceptibility   Methicillin resistant staphylococcus aureus - MIC*    CIPROFLOXACIN >=8 RESISTANT Resistant     ERYTHROMYCIN >=8 RESISTANT Resistant     GENTAMICIN <=0.5 SENSITIVE Sensitive     OXACILLIN >=4 RESISTANT Resistant     TETRACYCLINE <=1 SENSITIVE Sensitive     VANCOMYCIN 1 SENSITIVE Sensitive     TRIMETH/SULFA <=10 SENSITIVE Sensitive     CLINDAMYCIN >=8 RESISTANT Resistant     RIFAMPIN <=0.5 SENSITIVE Sensitive     Inducible Clindamycin NEGATIVE Sensitive     * METHICILLIN RESISTANT STAPHYLOCOCCUS AUREUS  Blood Culture ID Panel (Reflexed)     Status: Abnormal   Collection Time: 10/07/18  3:29 AM  Result Value Ref Range Status   Enterococcus species NOT DETECTED NOT DETECTED Final   Listeria monocytogenes NOT DETECTED NOT DETECTED Final   Staphylococcus species DETECTED (A) NOT DETECTED Final    Comment: CRITICAL RESULT CALLED TO, READ BACK BY AND VERIFIED WITH: PHARMD AMELIE SAINTCLAIR AT 1038 ON 10/08/2018 BY MHOUEGNIFIO    Staphylococcus aureus (BCID) DETECTED (A) NOT DETECTED Final    Comment: Methicillin (oxacillin)-resistant Staphylococcus aureus (MRSA). MRSA is predictably resistant to beta-lactam antibiotics (except ceftaroline). Preferred therapy is vancomycin unless clinically contraindicated. Patient requires contact precautions if  hospitalized. CRITICAL RESULT CALLED TO, READ BACK BY AND VERIFIED WITH: [place] AT  3151 ON 10/08/2018 BY MHOUEGNIFIO    Methicillin resistance DETECTED (A) NOT DETECTED Final    Comment: CRITICAL RESULT CALLED TO, READ BACK BY AND VERIFIED WITH: [place] AT 7616 ON 10/08/2018 BY MHOUEGNIFIO    Streptococcus species NOT DETECTED NOT DETECTED Final   Streptococcus agalactiae NOT DETECTED NOT DETECTED Final   Streptococcus pneumoniae NOT DETECTED NOT DETECTED Final   Streptococcus pyogenes NOT DETECTED NOT DETECTED Final   Acinetobacter baumannii NOT DETECTED NOT DETECTED Final   Enterobacteriaceae species NOT DETECTED NOT DETECTED Final   Enterobacter cloacae complex NOT DETECTED NOT DETECTED Final   Escherichia coli NOT DETECTED NOT DETECTED Final   Klebsiella oxytoca NOT DETECTED NOT DETECTED Final   Klebsiella pneumoniae NOT DETECTED NOT DETECTED Final   Proteus species NOT DETECTED NOT DETECTED Final   Serratia marcescens NOT DETECTED NOT DETECTED Final   Haemophilus influenzae NOT DETECTED NOT DETECTED Final   Neisseria meningitidis NOT DETECTED NOT DETECTED Final   Pseudomonas aeruginosa NOT DETECTED NOT DETECTED Final   Candida albicans NOT DETECTED NOT DETECTED Final   Candida glabrata NOT DETECTED NOT DETECTED Final   Candida krusei NOT DETECTED NOT DETECTED Final   Candida parapsilosis NOT DETECTED NOT DETECTED Final   Candida tropicalis NOT DETECTED NOT DETECTED Final    Comment: Performed at [facility], [place]. [address] [place], [place] [address]  MRSA PCR Screening     Status: Abnormal   Collection Time: 10/07/18  5:34 AM  Result Value Ref Range Status   MRSA by PCR POSITIVE (A) NEGATIVE Final    Comment:        The GeneXpert MRSA Assay (FDA  approved for NASAL specimens only), is one component of a comprehensive MRSA colonization surveillance program. It is not intended to diagnose MRSA infection nor to guide or monitor treatment for MRSA infections. RESULT CALLED TO, READ BACK BY AND VERIFIED WITH: B. DAVIS RN, AT [phone]  10/07/18 BY Rush Landmark Performed at [facility], [place] [address] [place], [place] [address]   Urine Culture     Status: Abnormal   Collection Time: 10/07/18 10:30 AM  Result Value Ref Range Status   Specimen Description URINE, RANDOM  Final   Special Requests NONE  Final   Culture (A)  Final    <10,000 COLONIES/mL INSIGNIFICANT GROWTH Performed at [facility] [address], [place], [place] [address]    Report Status 10/08/2018 FINAL  Final  Culture, blood (routine x 2)     Status: None (Preliminary result)   Collection Time: 10/09/18 10:21 AM  Result Value Ref Range Status   Specimen Description BLOOD RIGHT ANTECUBITAL  Final   Special Requests   Final    BOTTLES DRAWN AEROBIC ONLY Blood Culture adequate volume   Culture   Final    NO GROWTH 4 DAYS Performed at [facility], [place] [address] [place], [place] [address]    Report Status PENDING  Incomplete  Culture, blood (routine x 2)     Status: None (Preliminary result)   Collection Time: 10/09/18 10:23 AM  Result Value Ref Range Status   Specimen Description BLOOD LEFT HAND  Final   Special Requests   Final    BOTTLES DRAWN AEROBIC ONLY Blood Culture results may not be optimal due to an inadequate volume of blood received in culture bottles   Culture   Final    NO GROWTH 4 DAYS Performed at [facility], [place] [address] [place], [place] [address]    Report Status PENDING  Incomplete    Radiology Reports Dg Chest 1 View  Result Date: 10/07/2018 CLINICAL DATA:  MRSA bacteremia, PICC line placement EXAM: CHEST  1 VIEW COMPARISON:  None. FINDINGS: Lungs are clear.  No pleural effusion or pneumothorax. The heart is normal in size. Left arm PICC terminates in the upper SVC, 4.5 cm above the cavoatrial junction. IMPRESSION: Left arm PICC terminates in the upper SVC, 4.5 cm above the cavoatrial junction. Electronically Signed   By: Julian Hy M.D.   On: 10/07/2018 06:05   Dg Humerus Left  Result  Date: 10/07/2018 CLINICAL DATA:  MRSA bacteremia, status post PICC line placement EXAM: LEFT HUMERUS - 2+ VIEW COMPARISON:  None. FINDINGS: No fracture or dislocation is seen. The joint spaces are preserved. The visualized soft tissues are unremarkable. Visualized left lung is clear. Left arm PICC courses through the axillary region into the chest. IMPRESSION: Negative. Left arm PICC courses through the axillary region into the chest. Electronically Signed   By: Julian Hy M.D.   On: 10/07/2018 06:04   Vas [place] Lower Extremity Venous (dvt)  Result Date: 10/08/2018  Lower Venous Study Indications: Follow up DVT, off anticoagulants due to bleeding.  Performing Technologist: June Leap RDMS, RVT  Examination Guidelines: A complete evaluation includes B-mode imaging, spectral Doppler, color Doppler, and power Doppler as needed of all accessible portions of each vessel. Bilateral testing is considered an integral part of a complete examination. Limited examinations for reoccurring indications may be performed as noted.  Right Venous Findings: +---------+---------------+---------+-----------+----------+-------+            Compressibility  Phasicity Spontaneity Properties Summary  +---------+---------------+---------+-----------+----------+-------+  CFV       Full            Yes       Yes                             +---------+---------------+---------+-----------+----------+-------+  SFJ       Full                                                      +---------+---------------+---------+-----------+----------+-------+  FV Prox   Full                                                      +---------+---------------+---------+-----------+----------+-------+  FV Mid    Full                                                      +---------+---------------+---------+-----------+----------+-------+  FV Distal Full                                                       +---------+---------------+---------+-----------+----------+-------+  PFV       Full                                                      +---------+---------------+---------+-----------+----------+-------+  POP       Full            Yes       Yes                             +---------+---------------+---------+-----------+----------+-------+  PTV       Full                                                      +---------+---------------+---------+-----------+----------+-------+  PERO      Full                                                      +---------+---------------+---------+-----------+----------+-------+  Left Venous Findings: +---+---------------+---------+-----------+----------+-------+      Compressibility Phasicity Spontaneity Properties Summary  +---+---------------+---------+-----------+----------+-------+  CFV Full            Yes       Yes                             +---+---------------+---------+-----------+----------+-------+  Summary: Right: There is no evidence of deep vein thrombosis in the lower extremity. No cystic structure found in the popliteal fossa. Ultrasound characteristics of enlarged lymph nodes are noted in the groin. Left: No evidence of common femoral vein obstruction. Ultrasound characteristics of enlarged lymph nodes noted in the groin.  *See table(s) above for measurements and observations. Electronically signed by Ruta Hinds MD on 10/08/2018 at 8:49:33 PM.    Final    US Abdomen Limited Ruq  Result Date: 10/09/2018 CLINICAL DATA:  Abnormal LFTs EXAM: ULTRASOUND ABDOMEN LIMITED RIGHT UPPER QUADRANT COMPARISON:  None FINDINGS: Gallbladder: Incompletely distended. Mildly thickened gallbladder wall. No shadowing calculi, pericholecystic fluid or sonographic Murphy sign. Common bile duct: Diameter: 4 mm diameter, normal Liver: Normal appearance. No mass or nodularity. Portal vein is patent on color Doppler imaging with normal direction of blood flow towards the liver. No  free fluid in RIGHT upper quadrant. IMPRESSION: Incompletely distended gallbladder, which could account for a minimally thickened gallbladder wall. Otherwise negative exam. Electronically Signed   By: Lavonia Dana M.D.   On: 10/09/2018 16:24     CBC Recent Labs  Lab 10/08/18 0258 10/09/18 0616 10/10/18 0414 10/12/18 0142 10/13/18 0151  WBC 5.5 5.4 5.5 6.0 6.2  HGB 10.1* 10.0* 9.8* 10.0* 9.9*  HCT 31.7* 31.8* 30.9* 32.5* 31.1*  PLT 205 215 234 241 246  MCV 94.9 92.4 93.4 93.7 94.2  MCH 30.2 29.1 29.6 28.8 30.0  MCHC 31.9 31.4 31.7 30.8 31.8  RDW 12.7 12.9 13.3 13.3 13.4    Chemistries  Recent Labs  Lab 10/08/18 0258 10/09/18 0616 10/10/18 0414 10/12/18 0142 10/13/18 0151  NA 137 142 138 136 139  K 3.4* 3.2* 3.6 3.4* 3.9  CL 105 109 107 106 109  CO2 '25 25 25 26 23  ' GLUCOSE 174* 156* 167* 145* 272*  BUN 5* '8 9 15 14  ' CREATININE 0.63 0.59 0.61 0.65 0.59  CALCIUM 8.6* 9.0 9.2 8.6* 8.7*  AST  --  38 36  --  30  ALT  --  38 37  --  28  ALKPHOS  --  366* 365*  --  356*  BILITOT  --  1.0 1.2  --  1.0    Lab Results  Component Value Date   HGBA1C 11.0 (H) 10/07/2018   Coagulation profile No results for input(s): INR, PROTIME in the last 168 hours. Roxan Hockey M.D on 10/14/2018 at 9:48 AM  Go to [email or web address] - for contact info  [facility] - Office  [phone]

## 2018-10-15 ENCOUNTER — Inpatient Hospital Stay: Payer: Self-pay

## 2018-10-15 DIAGNOSIS — Z95828 Presence of other vascular implants and grafts: Secondary | ICD-10-CM

## 2018-10-15 LAB — BASIC METABOLIC PANEL
Anion gap: 7 (ref 5–15)
BUN: 20 mg/dL (ref 6–20)
CO2: 22 mmol/L (ref 22–32)
Calcium: 9.4 mg/dL (ref 8.9–10.3)
Chloride: 108 mmol/L (ref 98–111)
Creatinine, Ser: 0.66 mg/dL (ref 0.44–1.00)
GFR calc Af Amer: >60 mL/min (ref >60)
GFR calc non Af Amer: >60 mL/min (ref >60)
Glucose, Bld: 138 mg/dL — ABNORMAL HIGH (ref 70–99)
Potassium: 3.6 mmol/L (ref 3.5–5.1)
Sodium: 137 mmol/L (ref 135–145)

## 2018-10-15 LAB — GLUCOSE, CAPILLARY
Glucose-Capillary: 126 mg/dL — ABNORMAL HIGH (ref 70–99)
Glucose-Capillary: 226 mg/dL — ABNORMAL HIGH (ref 70–99)

## 2018-10-15 LAB — CBC
HCT: 34.1 % — ABNORMAL LOW (ref 36.0–46.0)
Hemoglobin: 10.2 g/dL — ABNORMAL LOW (ref 12.0–15.0)
MCH: 28.6 pg (ref 26.0–34.0)
MCHC: 29.9 g/dL — ABNORMAL LOW (ref 30.0–36.0)
MCV: 95.5 fL (ref 80.0–100.0)
Platelets: 272 10*3/uL (ref 150–400)
RBC: 3.57 MIL/uL — ABNORMAL LOW (ref 3.87–5.11)
RDW: 13.6 % (ref 11.5–15.5)
WBC: 6.6 10*3/uL (ref 4.0–10.5)
nRBC: 0 % (ref 0.0–0.2)

## 2018-10-15 MED ORDER — LOPERAMIDE HCL 2 MG PO CAPS
2.0000 mg | ORAL_CAPSULE | Freq: Once | ORAL | Status: AC
  Administered 2018-10-15: 11:00:00 2 mg via ORAL
  Filled 2018-10-15: qty 1

## 2018-10-15 MED ORDER — VANCOMYCIN IV (FOR PTA / DISCHARGE USE ONLY): 1000.0000 mg | Freq: Two times a day (BID) | INTRAVENOUS | 0 refills | Status: AC

## 2018-10-15 MED ORDER — HYDROXYZINE HCL 25 MG PO TABS: 25.0000 mg | ORAL_TABLET | Freq: Every day | ORAL | 0 refills | Status: DC

## 2018-10-15 MED ORDER — METOPROLOL TARTRATE 25 MG PO TABS: 25.0000 mg | ORAL_TABLET | Freq: Two times a day (BID) | ORAL | 2 refills | Status: DC

## 2018-10-15 MED ORDER — AMLODIPINE BESYLATE 5 MG PO TABS: 5.0000 mg | ORAL_TABLET | Freq: Every day | ORAL | 2 refills | Status: DC

## 2018-10-15 MED ORDER — HYDROXYZINE HCL 25 MG PO TABS: 25.0000 mg | ORAL_TABLET | Freq: Two times a day (BID) | ORAL | 0 refills | Status: DC | PRN

## 2018-10-15 MED ORDER — LOPERAMIDE HCL 2 MG PO CAPS: 2.0000 mg | ORAL_CAPSULE | Freq: Four times a day (QID) | ORAL | Status: DC | PRN

## 2018-10-15 MED ORDER — ADULT MULTIVITAMIN W/MINERALS CH: 1.0000 | ORAL_TABLET | Freq: Every day | ORAL | 3 refills | Status: DC

## 2018-10-15 MED ORDER — LOPERAMIDE HCL 2 MG PO CAPS: 2.0000 mg | ORAL_CAPSULE | Freq: Four times a day (QID) | ORAL | 0 refills | Status: AC | PRN

## 2018-10-15 MED ORDER — SODIUM CHLORIDE 0.9% FLUSH: 10.0000 mL | INTRAVENOUS | Status: DC | PRN

## 2018-10-15 MED ORDER — POTASSIUM CHLORIDE ER 20 MEQ PO TBCR: 20.0000 meq | EXTENDED_RELEASE_TABLET | Freq: Every day | ORAL | 0 refills | Status: DC

## 2018-10-15 MED ORDER — ACETAMINOPHEN 325 MG PO TABS: 650.0000 mg | ORAL_TABLET | Freq: Four times a day (QID) | ORAL | 2 refills | Status: AC | PRN

## 2018-10-15 MED ORDER — INSULIN ASPART 100 UNIT/ML ~~LOC~~ SOLN: 3.0000 [IU] | Freq: Three times a day (TID) | SUBCUTANEOUS | 11 refills | Status: AC

## 2018-10-15 MED ORDER — PRO-STAT SUGAR FREE PO LIQD: 30.0000 mL | Freq: Two times a day (BID) | ORAL | 0 refills | Status: DC

## 2018-10-15 MED ORDER — HYDROXYZINE HCL 25 MG PO TABS: 25.0000 mg | ORAL_TABLET | Freq: Two times a day (BID) | ORAL | 0 refills | Status: DC

## 2018-10-15 NOTE — Progress Notes (Signed)
[facility]  Date of Admission:  10/07/2018   Total days of antibiotics: 31         ASSESSMENT/PLAN: 43yo female with MRSA bacteremia x1 on 2 blood cultures with osteomyelitis of the right foot with Charcot 2/2 diabetes. Repeat blood cultures 3/31 NGTD for five days.   - PICC line ordered - IV Vanc for six weeks total starting 3/31 ending 5/12 - f/u Dr. 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 amLODipine  5 mg Oral Daily  . feeding supplement (PRO-STAT SUGAR FREE 64)  30 mL Oral BID  . heparin injection (subcutaneous)  5,000 Units Subcutaneous Q8H  . hydrOXYzine  25 mg Oral QHS  . hydrOXYzine  25 mg Oral BID  . insulin aspart  0-5 Units Subcutaneous QHS  . insulin aspart  0-9 Units Subcutaneous TID WC  . insulin aspart  4 Units Subcutaneous TID WC  . insulin glargine  40 Units Subcutaneous BID  . metoprolol tartrate  25 mg Oral BID  . multivitamin with minerals  1 tablet Oral Daily  . potassium chloride  40 mEq Oral Once  . zinc sulfate  220 mg Oral Daily   Continuous Infusions: . vancomycin 1,000 mg (10/15/18 0521)   PRN Meds:.acetaminophen **OR** acetaminophen, bisacodyl, hydrocortisone cream, labetalol, senna-docusate, traMADol, zolpidem   SUBJECTIVE: She feels well this morning. She is making urine, able to ambulate well. Her leg is not hurting and she denies swelling and is ready to go home.   Review of Systems: Review of Systems  Constitutional: Negative for chills and fever.  Respiratory: Negative for cough and  shortness of breath.   Cardiovascular: Negative for chest pain, claudication and leg swelling.  Gastrointestinal: Negative for nausea and vomiting.  Skin: Negative for itching and rash.    No Known Allergies  OBJECTIVE: Vitals:   10/13/18 2006 10/14/18 1429 10/14/18 2035 10/15/18 0416  BP: 110/80 (!) 122/91 100/62 110/76  Pulse: 100 (!) 102 (!) 105 95  Resp: 18 16 16 17   Temp: 98.4 F (36.9 C) 98.3 F (36.8 C) 98.4 F (36.9 C) (!) 97.2 F (36.2 C)  TempSrc: Oral Oral Oral Oral  SpO2: 100% 100% 99% 100%  Weight:      Height:       Body mass index is 27.44 kg/m.  Physical Exam Constitution: NAD, sitting up in chair HENT: AT, [employer] Eyes: no icterus or injection  Respiratory: non-labored breathing, CTA  MSK: RLE wound extending to MSK with new granulation tissue, RLE trace edema   Neuro: a&o, normal affect  Skin: dry, flaky, otherwise c/d/i    Lab Results Lab Results  Component Value Date   WBC 6.6 10/15/2018   HGB 10.2 (L) 10/15/2018   HCT 34.1 (L) 10/15/2018   MCV 95.5 10/15/2018   PLT 272 10/15/2018    Lab Results  Component Value Date   CREATININE 0.66 10/15/2018   BUN 20 10/15/2018   NA 137 10/15/2018   K 3.6 10/15/2018   CL 108 10/15/2018  CO2 22 10/15/2018    Lab Results  Component Value Date   ALT 28 10/13/2018   AST 30 10/13/2018   ALKPHOS 356 (H) 10/13/2018   BILITOT 1.0 10/13/2018     Microbiology: Recent Results (from the past 240 hour(s))  Culture, blood (routine x 2)     Status: None   Collection Time: 10/07/18  3:20 AM  Result Value Ref Range Status   Specimen Description BLOOD RIGHT ARM  Final   Special Requests   Final    BOTTLES DRAWN AEROBIC AND ANAEROBIC Blood Culture results may not be optimal due to an excessive volume of blood received in culture bottles   Culture   Final    NO GROWTH 5 DAYS Performed at [facility], [address] [place], [place] [address]    Report Status 10/12/2018 FINAL  Final  Culture, blood  (routine x 2)     Status: Abnormal   Collection Time: 10/07/18  3:29 AM  Result Value Ref Range Status   Specimen Description BLOOD RIGHT HAND  Final   Special Requests   Final    BOTTLES DRAWN AEROBIC ONLY Blood Culture adequate volume   Culture  Setup Time   Final    GRAM POSITIVE COCCI IN CLUSTERS AEROBIC BOTTLE ONLY CRITICAL RESULT CALLED TO, READ BACK BY AND VERIFIED WITH: PHARMD AMELIE SAINTCLAIR AT 1038 ON 10/08/2018 BY MHOUEGNIFIO Performed at [facility], [address], [place], [place] [address]    Culture METHICILLIN RESISTANT STAPHYLOCOCCUS AUREUS (A)  Final   Report Status 10/10/2018 FINAL  Final   Organism ID, Bacteria METHICILLIN RESISTANT STAPHYLOCOCCUS AUREUS  Final      Susceptibility   Methicillin resistant staphylococcus aureus - MIC*    CIPROFLOXACIN >=8 RESISTANT Resistant     ERYTHROMYCIN >=8 RESISTANT Resistant     GENTAMICIN <=0.5 SENSITIVE Sensitive     OXACILLIN >=4 RESISTANT Resistant     TETRACYCLINE <=1 SENSITIVE Sensitive     VANCOMYCIN 1 SENSITIVE Sensitive     TRIMETH/SULFA <=10 SENSITIVE Sensitive     CLINDAMYCIN >=8 RESISTANT Resistant     RIFAMPIN <=0.5 SENSITIVE Sensitive     Inducible Clindamycin NEGATIVE Sensitive     * METHICILLIN RESISTANT STAPHYLOCOCCUS AUREUS  Blood Culture ID Panel (Reflexed)     Status: Abnormal   Collection Time: 10/07/18  3:29 AM  Result Value Ref Range Status   Enterococcus species NOT DETECTED NOT DETECTED Final   Listeria monocytogenes NOT DETECTED NOT DETECTED Final   Staphylococcus species DETECTED (A) NOT DETECTED Final    Comment: CRITICAL RESULT CALLED TO, READ BACK BY AND VERIFIED WITH: PHARMD AMELIE SAINTCLAIR AT 1038 ON 10/08/2018 BY MHOUEGNIFIO    Staphylococcus aureus (BCID) DETECTED (A) NOT DETECTED Final    Comment: Methicillin (oxacillin)-resistant Staphylococcus aureus (MRSA). MRSA is predictably resistant to beta-lactam antibiotics (except ceftaroline). Preferred therapy is vancomycin unless  clinically contraindicated. Patient requires contact precautions if  hospitalized. CRITICAL RESULT CALLED TO, READ BACK BY AND VERIFIED WITH: PHARMD AMELIE SAINTCLAIR AT 1038 ON 10/08/2018 BY MHOUEGNIFIO    Methicillin resistance DETECTED (A) NOT DETECTED Final    Comment: CRITICAL RESULT CALLED TO, READ BACK BY AND VERIFIED WITH: PHARMD AMELIE SAINTCLAIR AT 1038 ON 10/08/2018 BY MHOUEGNIFIO    Streptococcus species NOT DETECTED NOT DETECTED Final   Streptococcus agalactiae NOT DETECTED NOT DETECTED Final   Streptococcus pneumoniae NOT DETECTED NOT DETECTED Final   Streptococcus pyogenes NOT DETECTED NOT DETECTED Final   Acinetobacter baumannii NOT  DETECTED NOT DETECTED Final   Enterobacteriaceae species NOT DETECTED NOT DETECTED Final   Enterobacter cloacae complex NOT DETECTED NOT DETECTED Final   Escherichia coli NOT DETECTED NOT DETECTED Final   Klebsiella oxytoca NOT DETECTED NOT DETECTED Final   Klebsiella pneumoniae NOT DETECTED NOT DETECTED Final   Proteus species NOT DETECTED NOT DETECTED Final   Serratia marcescens NOT DETECTED NOT DETECTED Final   Haemophilus influenzae NOT DETECTED NOT DETECTED Final   Neisseria meningitidis NOT DETECTED NOT DETECTED Final   Pseudomonas aeruginosa NOT DETECTED NOT DETECTED Final   Candida albicans NOT DETECTED NOT DETECTED Final   Candida glabrata NOT DETECTED NOT DETECTED Final   Candida krusei NOT DETECTED NOT DETECTED Final   Candida parapsilosis NOT DETECTED NOT DETECTED Final   Candida tropicalis NOT DETECTED NOT DETECTED Final    Comment: Performed at [facility], [address] [place], [place] [address]  MRSA PCR Screening     Status: Abnormal   Collection Time: 10/07/18  5:34 AM  Result Value Ref Range Status   MRSA by PCR POSITIVE (A) NEGATIVE Final    Comment:        The GeneXpert MRSA Assay (FDA approved for NASAL specimens only), is one component of a comprehensive MRSA colonization surveillance program. It is not  intended to diagnose MRSA infection nor to guide or monitor treatment for MRSA infections. RESULT CALLED TO, READ BACK BY AND VERIFIED WITH: B. DAVIS RN, AT [phone] 10/07/18 BY Renato Shin. VANHOOK Performed at [facility], [address] [place], [place] [address]   Urine Culture     Status: Abnormal   Collection Time: 10/07/18 10:30 AM  Result Value Ref Range Status   Specimen Description URINE, RANDOM  Final   Special Requests NONE  Final   Culture (A)  Final    <10,000 COLONIES/mL INSIGNIFICANT GROWTH Performed at [facility], [address] [place], [place] [address]    Report Status 10/08/2018 FINAL  Final  Culture, blood (routine x 2)     Status: None   Collection Time: 10/09/18 10:21 AM  Result Value Ref Range Status   Specimen Description BLOOD RIGHT ANTECUBITAL  Final   Special Requests   Final    BOTTLES DRAWN AEROBIC ONLY Blood Culture adequate volume   Culture   Final    NO GROWTH 5 DAYS Performed at [facility], [address] [place], [place] [address]    Report Status 10/14/2018 FINAL  Final  Culture, blood (routine x 2)     Status: None   Collection Time: 10/09/18 10:23 AM  Result Value Ref Range Status   Specimen Description BLOOD LEFT HAND  Final   Special Requests   Final    BOTTLES DRAWN AEROBIC ONLY Blood Culture results may not be optimal due to an inadequate volume of blood received in culture bottles   Culture   Final    NO GROWTH 5 DAYS Performed at [facility], [address] [place], [place] [address]    Report Status 10/14/2018 FINAL  Final    Guinevere ScarletSeawell, Ankush Gintz A, DO 10/15/2018, 7:27 AM

## 2018-10-15 NOTE — Care Management (Signed)
Faxed admission H and P , home health orders and face to face, today's progress note to [facility]  . Will fax discharge summary when available ( dated 10/08/18).  [facility] phone [phone], fax [phone]  Ronny Flurry RN [phone]

## 2018-10-15 NOTE — Progress Notes (Signed)
Pt given prescription and discharge instructions. Instructions gone over with her and all questions answered. Walker delivered to room and taken by pt when discharged. PICC in place. All belongings gathered to be sent home.

## 2018-10-15 NOTE — Progress Notes (Signed)
Peripherally Inserted Central Catheter/Midline Placement  The IV Nurse has discussed with the patient and/or persons authorized to consent for the patient, the purpose of this procedure and the potential benefits and risks involved with this procedure.  The benefits include less needle sticks, lab draws from the catheter, and the patient may be discharged home with the catheter. Risks include, but not limited to, infection, bleeding, blood clot (thrombus formation), and puncture of an artery; nerve damage and irregular heartbeat and possibility to perform a PICC exchange if needed/ordered by physician.  Alternatives to this procedure were also discussed.  Bard Power PICC patient education guide, fact sheet on infection prevention and patient information card has been provided to patient /or left at bedside.    PICC/Midline Placement Documentation  PICC Single Lumen 10/15/18 PICC Right Basilic 37 cm 0 cm (Active)  Indication for Insertion or Continuance of Line Home intravenous therapies (PICC only) 10/15/2018  5:06 PM  Exposed Catheter (cm) 0 cm 10/15/2018  5:06 PM  Site Assessment Clean;Intact;Dry 10/15/2018  5:06 PM  Line Status Flushed;Blood return noted;Saline locked 10/15/2018  5:06 PM  Dressing Type Transparent 10/15/2018  5:06 PM  Dressing Status Clean;Dry;Intact 10/15/2018  5:06 PM  Dressing Change Due 10/22/18 10/15/2018  5:06 PM       Yolanda [name] 10/15/2018, 5:08 PM

## 2018-10-15 NOTE — Progress Notes (Signed)
   10/15/18 1400  Clinical Encounter Type  Visited With Patient and family together;Health care provider  Visit Type Initial  Referral From Nurse  Spiritual Encounters  Spiritual Needs Emotional  Stress Factors  Patient Stress Factors Health changes  Family Stress Factors Not reviewed   Referral from charge RN.  Intro visit w/ pt, she expressed excitement at getting to go home today.  Her mother was present, listened.  Care RN was present for nearly all of the visit.  Pt expressed no current spiritual/chaplain needs.  Margretta Sidle resident, [phone]

## 2018-10-15 NOTE — Plan of Care (Signed)
    Problem: Education: Goal: Knowledge of General Education information will improve Description: Including pain rating scale, medication(s)/side effects and non-pharmacologic comfort measures Outcome: Adequate for Discharge   Problem: Health Behavior/Discharge Planning: Goal: Ability to manage health-related needs will improve Outcome: Adequate for Discharge   Problem: Activity: Goal: Risk for activity intolerance will decrease Outcome: Adequate for Discharge   

## 2018-10-15 NOTE — Progress Notes (Signed)
Physical Therapy Treatment Patient Details Name: Yolanda [name] MRN: [id number] DOB: Nov 29, 1975 Today's Date: 10/15/2018    History of Present Illness Yolanda [name] is a 43 y.o. female admitted for Diabetic foot ulcer with osteomyelitis. She has PMH of  uncontrolled diabetic  neuropathy who presents with a chronic wound/ulcer over the lateral aspect of the right leg.  Patient has been undergoing wound care and treatment for several months. She is NWB on Rt leg unless has boot.    PT Comments    Pt is making slow progress towards her goals, however she continues to be limited by her decreased UE strength and related inability to maintain NWB through R LE. Pt is supervision for transfers and ambulation with maximal vc for NWB. Pt reports she does not have RW at home. PT spoke with CM to order. PT will continue to follow acutely.     Follow Up Recommendations  Home health PT;Supervision for mobility/OOB     Equipment Recommendations  Rolling walker with 5" wheels       Precautions / Restrictions Precautions Precautions: Fall Restrictions Weight Bearing Restrictions: Yes RLE Weight Bearing: Non weight bearing    Mobility  Bed Mobility               General bed mobility comments: Patient found in a chair   Transfers Overall transfer level: Needs assistance Equipment used: Rolling walker (2 wheeled) Transfers: Sit to/from Stand Sit to Stand: Supervision         General transfer comment: educated on need for power up to RW without WB through R LE  Ambulation/Gait Ambulation/Gait assistance: Supervision Gait Distance (Feet): 20 Feet Assistive device: Rolling walker (2 wheeled) Gait Pattern/deviations: Step-to pattern Gait velocity: decreased Gait velocity interpretation: <1.8 ft/sec, indicate of risk for recurrent falls General Gait Details: Pt continues to have difficulty with maintaining NWB, max vc for sequencing and use of UE to maintain weightbearing, pt with  increased difficulty due to reduced UE strength        Balance Overall balance assessment: Needs assistance Sitting-balance support: No upper extremity supported Sitting balance-Leahy Scale: Good     Standing balance support: Bilateral upper extremity supported Standing balance-Leahy Scale: Poor                              Cognition Arousal/Alertness: Awake/alert Behavior During Therapy: WFL for tasks assessed/performed Overall Cognitive Status: No family/caregiver present to determine baseline cognitive functioning Area of Impairment: Safety/judgement;Problem solving                             Problem Solving: Slow processing;Requires verbal cues General Comments: pt is likely at baseline          General Comments General comments (skin integrity, edema, etc.): Pt requires assist in donning CAM boot, educated that she will need to have family members assist, and that she needs to wear it whenever she is ambulating, pt with increased diarrhea today limiting PT session.      Pertinent Vitals/Pain Pain Assessment: No/denies pain Faces Pain Scale: No hurt           PT Goals (current goals can now be found in the care plan section) Acute Rehab PT Goals Patient Stated Goal: return home PT Goal Formulation: With patient Time For Goal Achievement: 10/22/18 Potential to Achieve Goals: Good Progress towards PT goals: Progressing toward goals    Frequency  Min 3X/week      PT Plan Current plan remains appropriate       AM-PAC PT "6 Clicks" Mobility   Outcome Measure  Help needed turning from your back to your side while in a flat bed without using bedrails?: None Help needed moving from lying on your back to sitting on the side of a flat bed without using bedrails?: None Help needed moving to and from a bed to a chair (including a wheelchair)?: None Help needed standing up from a chair using your arms (e.g., wheelchair or bedside  chair)?: None Help needed to walk in hospital room?: A Little Help needed climbing 3-5 steps with a railing? : A Little 6 Click Score: 22    End of Session Equipment Utilized During Treatment: Gait belt Activity Tolerance: Patient tolerated treatment well Patient left: in chair;with call bell/phone within reach Nurse Communication: Mobility status PT Visit Diagnosis: Unsteadiness on feet (R26.81);Other abnormalities of gait and mobility (R26.89);Muscle weakness (generalized) (M62.81);Difficulty in walking, not elsewhere classified (R26.2);Pain Pain - Right/Left: Right Pain - part of body: Ankle and joints of foot     Time: 5732-2025 PT Time Calculation (min) (ACUTE ONLY): 15 min  Charges:  $Gait Training: 8-22 mins                     Tesla Keeler B. Beverely Risen PT, DPT Acute Rehabilitation Services Pager [phone] Office [phone]    Elon Alas Fleet 10/15/2018, 10:35 AM

## 2018-10-15 NOTE — Discharge Summary (Signed)
Yolanda [name], is a 43 y.o. female  DOB 08/14/1975  MRN [id number].  Admission date:  10/07/2018  Admitting Physician  Allie Bossier, MD  Discharge Date:  10/15/2018   Primary MD  No primary care provider on file.  Recommendations for primary care physician for things to follow:   1)Rt Leg---wear the sock and sleeve during waking hours and the socks only 24 hours a day, this should be changed daily. 2)Avoid ibuprofen/Advil/Aleve/Motrin/Goody Powders/Naproxen/BC powders/Meloxicam/Diclofenac/Indomethacin and other Nonsteroidal anti-inflammatory medications as these will make you more likely to bleed and can cause stomach ulcers, can also cause Kidney problems.  3)Follow up with Dr Sharol Given the orthopedic surgeon as scheduled on 10/16/2018 4) follow-up with infectious disease specialist Dr. Drucilla Schmidt as scheduled 5) continue IV vancomycin as advised through PICC line for 6 weeks through 11/20/2018 6) CBC, BMP, Vancomycin trough levels, ESR , CRP as per OPAT orders  Admission Diagnosis  [place]   Discharge Diagnosis  CELLULTIS- FOOT OSTEOMYLELITIS RT ANKLE    Principal Problem:   MRSA bacteremia Active Problems:   Osteomyelitis of ankle and foot ([place])   Diabetes mellitus type 2, uncontrolled, with complications ([place])   Charcot foot due to diabetes mellitus ([place])   Severe protein-calorie malnutrition ([place])   Leg wound, right, sequela   Diabetic polyneuropathy associated with type 2 diabetes mellitus ([place])   Cigarette smoker   Abnormal LFTs   S/P PICC central line placement      Past Medical History:  Diagnosis Date   MRSA infection within last 3 months    Type 2 diabetes mellitus treated with insulin ([place])     Past Surgical History:  Procedure Laterality Date   IRRIGATION AND DEBRIDEMENT KNEE Left      HPI  from the history and physical done on the day of admission:     Chief Complaint: Diabetic foot ulcer with osteomyelitis  HPI: Yolanda [name] is a 43 y.o.  BF PMHx Uncontrolled Diabetes type 2 with complication, diabetic foot ulcer ,OM and Celllulitis with MRSA Bacteremia as well , 4 weeks ago, on Daptomycin . Was followed by surgery at [facility] who contacted Dr Sharol Given from orthopedic surgery who agreed to see patient in consult . Pt accepted to Medsurg at [facility] . Pt had an MRI at [facility] which showed OM   Hospital Course:   Brief Summary  43 y.o.BFPMHxUncontrolledDiabetes type 2 with complication, Rt Foot/Ankle diabetic foot ulcer/Charcot's foot, osteomyelitis and Celllulitis withMRSA Bacteremia as well  4 weeks PTA treated with iv Daptomycin (from 09/14/2018 thru 10/07/18) . Was followed by surgery at [facility] who contacted Dr Rico Sheehan orthopedic surgery who agreed to see patient in consult .  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 Failed Daptomycin (blood cultures remain positive despite more than 3 weeks of IV daptomycin-from 09/14/2018 thru 10/07/18),   TTE with suspicion of endocarditis, cardiology reluctant to do TEE given current COVID-19  situation bacteremia persisted despite IV daptomycin, ID recommends IV vancomycin for 6 weeks from first negative culture (Start 10/09/18---- last dose 11/20/18)...... Previous Lt Arm PICC line removed, --- Patient was on daptomycin at the outside facility, started on 09/14/2018, supposed to finish on 4/10 for total 6 weeks. PICC line in left arm Removed due to +ve Repeat blood cultures done here from 10/07/2018+ for MRSA, repeat blood culture from 10/09/2018 NGTD, afebrile blood cultures remain negative , New PICC line Placed on 10/15/2018--..the patient will Need-IV vancomycin twice a day at home with [facility] RN help  2)Diabetes type 2 uncontrolled with complication--.  A1c this admission 11.0  reflecting uncontrolled DM, Use Novolog/Humalog Sliding scale insulin with Accu-Cheks/Fingersticks as ordered, continue Lantus insulin 50 units twice daily, change NovoLog insulin to 3 units 3 times daily with meals  3)HTN----stable, c/n Amlodipine 5 mg daily, Metoprolol 25 mg bid,     4)Osteomyelitis RIGHT foot/ankle with cellulitis and Charcot Foot--- Dr. Sharol Given recommends no surgical intervention at this time, continue IV vancomycin , plan is for PICC line placement on 10/15/2018 if negative Repeat blood cultures  5)Right lower extremity DVT, diagnosed in 07/2018 -Patient was on Xarelto, completed starter pack however Xarelto was held due to bleeding issues at [facility] with debridement. -Right lower extremity venous Dopplers negative for DVT, will hold off on Xarelto at this time  6)Elevated alk phos-----Unclear etiology, AST down to 30, ALT down to 28, total bili 1.0 , daptomycin also causes abnormal LFTs, -Alk phos trending down to 356 from above 400 on admission,  -right upper quadrant ultrasound without acute findings, GGT, AMA, ANA, SMA mostly unremarkable  7)Severe protein calorie malnutrition/FTT--Albumin 2.2 -Placed on pro-stat, zinc daily  8)Diarrhea------ frequency and volume of stools continues to improve, per ID physician C. difficile less likely given that patient has been mostly on daptomycin and vancomycin, vancomycin does not really penetrate GI tract well... .. Patient remains afebrile without abdominal pain,  may use Imodium as needed   Code Status : Full Code  Family Communication:   None at bedside   Disposition Plan  : Home with home health IV vancomycin-- see OPAT orders OPAT Orders Discharge antibiotics: Vancomycin per pharmacy protocol  Aim for Vancomycin trough 15-20 (unless otherwise indicated) Duration: 6 weeks End Date:  May 12th, 2020  [place] Per Protocol:  Labs BI- weekly while on IV antibiotics:  _x_ BMP w GFR  Labs  weekly while on IV antibiotics: _x_ CBC with differential  x__ CRP x__ ESR  _x_ Vancomycin trough   x__ Please pull PIC at completion of IV antibiotics __ Please leave PIC in place until doctor has seen patient or been notified  Fax weekly labs to [phone]  She has an EVISIT followup with Dr.Van Dam on May 11th, 2020 @ 1045.    Consults  :  ID/ortho   Discharge Condition: Stable,, Please see OPAT orders  Follow UP  Follow-up Information    Newt Minion, MD Follow up on 10/16/2018.   Specialty:  Orthopedic Surgery Why:  10am Contact information: [place] [address] [phone]           Diet and Activity recommendation:  As advised  Discharge Instructions     Discharge Instructions    Diet Carb Modified   Complete by:  As directed    Discharge instructions   Complete by:  As directed    1)Rt Leg---wear the sock and sleeve during waking hours and the  socks only 24 hours a day, this should be changed daily. 2)Avoid ibuprofen/Advil/Aleve/Motrin/Goody Powders/Naproxen/BC powders/Meloxicam/Diclofenac/Indomethacin and other Nonsteroidal anti-inflammatory medications as these will make you more likely to bleed and can cause stomach ulcers, can also cause Kidney problems.  3)Follow up with Dr Sharol Given the orthopedic surgeon as scheduled on 10/16/2018 4) follow-up with infectious disease specialist Dr. Drucilla Schmidt as scheduled 5) continue IV vancomycin as advised through PICC line for 6 weeks through 11/20/2018 6) CBC, BMP, Vancomycin trough levels, ESR , CRP as per OPAT orders   Home infusion instructions [facility] May follow [place] Dosing Protocol; May administer Cathflo as needed to maintain patency of vascular access device.; Flushing of vascular access device: per [facility] Protocol: 0.9% NaCl pre/post medica...   Complete by:  As directed    Instructions:  May follow [place] Dosing Protocol   Instructions:  May administer  Cathflo as needed to maintain patency of vascular access device.   Instructions:  Flushing of vascular access device: per [facility] Protocol: 0.9% NaCl pre/post medication administration and prn patency; Heparin 100 u/ml, 46m for implanted ports and Heparin 10u/ml, 560mfor all other central venous catheters.   Instructions:  May follow [facility] Anaphylaxis Protocol for First Dose Administration in the home: 0.9% NaCl at 25-50 ml/hr to maintain IV access for protocol meds. Epinephrine 0.3 ml IV/IM PRN and Benadryl 25-50 IV/IM PRN s/s of anaphylaxis.   Instructions:  AdAddisonnfusion Coordinator (RN) to assist per patient IV care needs in the home PRN.   Home infusion instructions [facility] May follow [place] Protocol; May administer Cathflo as needed to maintain patency of vascular access device.; Flushing of vascular access device: [facility]: 0.9% NaCl pre/post medica...   Complete by:  As directed    Instructions:  May follow [place] Protocol   Instructions:  May administer Cathflo as needed to maintain patency of vascular access device.   Instructions:  Flushing of vascular access device: per [facility]: 0.9% NaCl pre/post medication administration and prn patency; Heparin 100 u/ml, 50m59mor implanted ports and Heparin 10u/ml, 50ml73mr all other central venous catheters.   Instructions:  May follow [facility] Anaphylaxis Protocol for First Dose Administration in the home: 0.9% NaCl at 25-50 ml/hr to maintain IV access for protocol meds. Epinephrine 0.3 ml IV/IM PRN and Benadryl 25-50 IV/IM PRN s/s of anaphylaxis.   Instructions:  AdvaHendryusion Coordinator (RN) to assist per patient IV care needs in the home PRN.   Increase activity slowly   Complete by:  As directed       Discharge Medications     Allergies as of 10/15/2018   No Known Allergies     Medication List    STOP taking these medications   daptomycin  IVPB Commonly known as:  CUBICIN    rivaroxaban 20 MG Tabs tablet Commonly known as:  XARELTO     TAKE these medications   acetaminophen 325 MG tablet Commonly known as:  TYLENOL Take 2 tablets (650 mg total) by mouth every 6 (six) hours as needed for mild pain, fever or headache (or Fever >/= 101).   amLODipine 5 MG tablet Commonly known as:  NORVASC Take 1 tablet (5 mg total) by mouth daily. Start taking on:  October 16, 2018   BASAGLAR KWIKPiedmont Geriatric HospitalInject 50 Units into the skin 2 (two) times daily.   feeding supplement (PRO-STAT SUGAR FREE 64) Liqd Take 30 mLs by mouth 2 (  two) times daily.   hydrOXYzine 25 MG tablet Commonly known as:  ATARAX/VISTARIL Take 1 tablet (25 mg total) by mouth at bedtime.   hydrOXYzine 25 MG tablet Commonly known as:  ATARAX/VISTARIL Take 1 tablet (25 mg total) by mouth 2 (two) times daily as needed for anxiety, itching or nausea.   insulin aspart 100 UNIT/ML injection Commonly known as:  novoLOG Inject 3 Units into the skin 3 (three) times daily with meals.   loperamide 2 MG capsule Commonly known as:  IMODIUM Take 1 capsule (2 mg total) by mouth every 6 (six) hours as needed for diarrhea or loose stools.   metoprolol tartrate 25 MG tablet Commonly known as:  LOPRESSOR Take 1 tablet (25 mg total) by mouth 2 (two) times daily.   multivitamin with minerals Tabs tablet Take 1 tablet by mouth daily. Start taking on:  October 16, 2018   Potassium Chloride ER 20 MEQ Tbcr Take 20 mEq by mouth daily for 5 days. 1 tab daily by mouth   traMADol 50 MG tablet Commonly known as:  ULTRAM Take 1 tablet (50 mg total) by mouth every 8 (eight) hours as needed for moderate pain or severe pain.   vancomycin  IVPB Inject 1,000 mg into the vein every 12 (twelve) hours. Indication:  MRSA bacteremia/endocarditis Last Day of Therapy:  11/20/2018 Labs - _0 /06/20 1323   10/08/18 0000  Home infusion instructions [facility] May follow [place] Dosing Protocol; May administer Cathflo as needed to maintain patency of vascular access device.; Flushing of vascular access device: per [facility] Protocol: 0.9% NaCl pre/post medica...    Question Answer Comment  Instructions May follow [place] Dosing Protocol   Instructions May administer Cathflo as needed to maintain patency of vascular access device.   Instructions Flushing of vascular access device: per [facility] Protocol: 0.9% NaCl pre/post medication administration and prn patency; Heparin 100 u/ml, 72m for  implanted ports and  Heparin 10u/ml, 23m for all other central venous catheters.   Instructions May follow [facility] Anaphylaxis Protocol for First Dose Administration in the home: 0.9% NaCl at 25-50 ml/hr to maintain IV access for protocol meds. Epinephrine 0.3 ml IV/IM PRN and Benadryl 25-50 IV/IM PRN s/s of anaphylaxis.   Instructions [facility] Infusion Coordinator (RN) to assist per patient IV care needs in the home PRN.      10/08/18 0839           Durable Medical Equipment  (From admission, onward)         Start     Ordered   10/15/18 1040  For home use only DME Walker rolling  Once    Question:  Patient needs a walker to treat with the following condition  Answer:  Chronic osteomyelitis of foot ([place]   10/15/18 1045          Major procedures and Radiology Reports - PLEASE review detailed and final reports for all details, in brief -    Dg Chest 1 View  Result Date: 10/07/2018 CLINICAL DATA:  MRSA bacteremia, PICC line placement EXAM: CHEST  1 VIEW COMPARISON:  None. FINDINGS: Lungs are clear.  No pleural effusion or pneumothorax. The heart is normal in size. Left arm PICC terminates in the upper SVC, 4.5 cm above the cavoatrial junction. IMPRESSION: Left arm PICC terminates in the upper SVC, 4.5 cm above the cavoatrial junction. Electronically Signed   By: SJulian HyM.D.   On: 10/07/2018 06:05   Dg Humerus Left  Result Date: 10/07/2018 CLINICAL DATA:  MRSA bacteremia, status post PICC line placement EXAM: LEFT HUMERUS - 2+ VIEW COMPARISON:  None. FINDINGS: No fracture or dislocation is seen. The joint spaces are preserved. The visualized soft tissues are unremarkable. Visualized left lung is clear. Left arm PICC courses through the axillary region into the chest. IMPRESSION: Negative. Left arm PICC courses through the axillary region into the chest. Electronically Signed   By: SJulian HyM.D.   On: 10/07/2018 06:04   Vas UK[place]Lower Extremity Venous  (dvt)  Result Date: 10/08/2018  Lower Venous Study Indications: Follow up DVT, off anticoagulants due to bleeding.  Performing Technologist: JJune LeapRDMS, RVT  Examination Guidelines: A complete evaluation includes B-mode imaging, spectral Doppler, color Doppler, and power Doppler as needed of all accessible portions of each vessel. Bilateral testing is considered an integral part of a complete examination. Limited examinations for reoccurring indications may be performed as noted.  Right Venous Findings: +---------+---------------+---------+-----------+----------+-------+            Compressibility Phasicity Spontaneity Properties Summary  +---------+---------------+---------+-----------+----------+-------+  CFV       Full            Yes       Yes                             +---------+---------------+---------+-----------+----------+-------+  SFJ       Full                                                      +---------+---------------+---------+-----------+----------+-------+  FV Prox   Full                                                      +---------+---------------+---------+-----------+----------+-------+  FV Mid    Full                                                      +---------+---------------+---------+-----------+----------+-------+  FV Distal Full                                                      +---------+---------------+---------+-----------+----------+-------+  PFV       Full                                                      +---------+---------------+---------+-----------+----------+-------+  POP       Full            Yes       Yes                             +---------+---------------+---------+-----------+----------+-------+  PTV       Full                                                      +---------+---------------+---------+-----------+----------+-------+  PERO      Full                                                       +---------+---------------+---------+-----------+----------+-------+  Left Venous Findings: +---+---------------+---------+-----------+----------+-------+      Compressibility Phasicity Spontaneity Properties Summary  +---+---------------+---------+-----------+----------+-------+  CFV Full            Yes       Yes                             +---+---------------+---------+-----------+----------+-------+    Summary: Right: There is no evidence of deep vein thrombosis in the lower extremity. No cystic structure found in the popliteal fossa. Ultrasound characteristics of enlarged lymph nodes are noted in the groin. Left: No evidence of common femoral vein obstruction. Ultrasound characteristics of enlarged lymph nodes noted in the groin.  *See table(s) above for measurements and observations. Electronically signed by Ruta Hinds MD on 10/08/2018 at 8:49:33 PM.    Final    [place] Ekg Site Rite  Result Date: 10/15/2018 If Site Rite image not attached, placement could not be confirmed due to current cardiac rhythm.  US Abdomen Limited Ruq  Result Date: 10/09/2018 CLINICAL DATA:  Abnormal LFTs EXAM: ULTRASOUND ABDOMEN LIMITED RIGHT UPPER QUADRANT COMPARISON:  None FINDINGS: Gallbladder: Incompletely distended. Mildly thickened gallbladder wall. No shadowing calculi, pericholecystic fluid or sonographic Murphy sign. Common bile duct: Diameter: 4 mm diameter, normal Liver: Normal appearance. No mass or nodularity. Portal vein is patent on color Doppler imaging  with normal direction of blood flow towards the liver. No free fluid in RIGHT upper quadrant. IMPRESSION: Incompletely distended gallbladder, which could account for a minimally thickened gallbladder wall. Otherwise negative exam. Electronically Signed   By: Lavonia Dana M.D.   On: 10/09/2018 16:24    Micro Results    Recent Results (from the past 240 hour(s))  Culture, blood (routine x 2)     Status: None   Collection Time: 10/07/18  3:20 AM  Result Value  Ref Range Status   Specimen Description BLOOD RIGHT ARM  Final   Special Requests   Final    BOTTLES DRAWN AEROBIC AND ANAEROBIC Blood Culture results may not be optimal due to an excessive volume of blood received in culture bottles   Culture   Final    NO GROWTH 5 DAYS Performed at [facility], [place] [address] [place], [place] [address]    Report Status 10/12/2018 FINAL  Final  Culture, blood (routine x 2)     Status: Abnormal   Collection Time: 10/07/18  3:29 AM  Result Value Ref Range Status   Specimen Description BLOOD RIGHT HAND  Final   Special Requests   Final    BOTTLES DRAWN AEROBIC ONLY Blood Culture adequate volume   Culture  Setup Time   Final    GRAM POSITIVE COCCI IN CLUSTERS AEROBIC BOTTLE ONLY CRITICAL RESULT CALLED TO, READ BACK BY AND VERIFIED WITH: PHARMD AMELIE SAINTCLAIR AT 2409 ON 10/08/2018 BY MHOUEGNIFIO Performed at [facility], [place] [address], [place], [place] [address]    Culture METHICILLIN RESISTANT STAPHYLOCOCCUS AUREUS (A)  Final   Report Status 10/10/2018 FINAL  Final   Organism ID, Bacteria METHICILLIN RESISTANT STAPHYLOCOCCUS AUREUS  Final      Susceptibility   Methicillin resistant staphylococcus aureus - MIC*    CIPROFLOXACIN >=8 RESISTANT Resistant     ERYTHROMYCIN >=8 RESISTANT Resistant     GENTAMICIN <=0.5 SENSITIVE Sensitive     OXACILLIN >=4 RESISTANT Resistant     TETRACYCLINE <=1 SENSITIVE Sensitive     VANCOMYCIN 1 SENSITIVE Sensitive     TRIMETH/SULFA <=10 SENSITIVE Sensitive     CLINDAMYCIN >=8 RESISTANT Resistant     RIFAMPIN <=0.5 SENSITIVE Sensitive     Inducible Clindamycin NEGATIVE Sensitive     * METHICILLIN RESISTANT STAPHYLOCOCCUS AUREUS  Blood Culture ID Panel (Reflexed)     Status: Abnormal   Collection Time: 10/07/18  3:29 AM  Result Value Ref Range Status   Enterococcus species NOT DETECTED NOT DETECTED Final   Listeria monocytogenes NOT DETECTED NOT DETECTED Final   Staphylococcus species DETECTED (A)  NOT DETECTED Final    Comment: CRITICAL RESULT CALLED TO, READ BACK BY AND VERIFIED WITH: PHARMD AMELIE SAINTCLAIR AT 1038 ON 10/08/2018 BY MHOUEGNIFIO    Staphylococcus aureus (BCID) DETECTED (A) NOT DETECTED Final    Comment: Methicillin (oxacillin)-resistant Staphylococcus aureus (MRSA). MRSA is predictably resistant to beta-lactam antibiotics (except ceftaroline). Preferred therapy is vancomycin unless clinically contraindicated. Patient requires contact precautions if  hospitalized. CRITICAL RESULT CALLED TO, READ BACK BY AND VERIFIED WITH: PHARMD AMELIE SAINTCLAIR AT 9924 ON 10/08/2018 BY MHOUEGNIFIO    Methicillin resistance DETECTED (A) NOT DETECTED Final    Comment: CRITICAL RESULT CALLED TO, READ BACK BY AND VERIFIED WITH: PHARMD AMELIE SAINTCLAIR AT 2683 ON 10/08/2018 BY MHOUEGNIFIO    Streptococcus species NOT DETECTED NOT DETECTED Final   Streptococcus agalactiae NOT DETECTED NOT DETECTED Final   Streptococcus pneumoniae NOT DETECTED NOT  DETECTED Final   Streptococcus pyogenes NOT DETECTED NOT DETECTED Final   Acinetobacter baumannii NOT DETECTED NOT DETECTED Final   Enterobacteriaceae species NOT DETECTED NOT DETECTED Final   Enterobacter cloacae complex NOT DETECTED NOT DETECTED Final   Escherichia coli NOT DETECTED NOT DETECTED Final   Klebsiella oxytoca NOT DETECTED NOT DETECTED Final   Klebsiella pneumoniae NOT DETECTED NOT DETECTED Final   Proteus species NOT DETECTED NOT DETECTED Final   Serratia marcescens NOT DETECTED NOT DETECTED Final   Haemophilus influenzae NOT DETECTED NOT DETECTED Final   Neisseria meningitidis NOT DETECTED NOT DETECTED Final   Pseudomonas aeruginosa NOT DETECTED NOT DETECTED Final   Candida albicans NOT DETECTED NOT DETECTED Final   Candida glabrata NOT DETECTED NOT DETECTED Final   Candida krusei NOT DETECTED NOT DETECTED Final   Candida parapsilosis NOT DETECTED NOT DETECTED Final   Candida tropicalis NOT DETECTED NOT DETECTED Final     Comment: Performed at [facility], [place] [address], [place], [place] [address]  MRSA PCR Screening     Status: Abnormal   Collection Time: 10/07/18  5:34 AM  Result Value Ref Range Status   MRSA by PCR POSITIVE (A) NEGATIVE Final    Comment:        The GeneXpert MRSA Assay (FDA approved for NASAL specimens only), is one component of a comprehensive MRSA colonization surveillance program. It is not intended to diagnose MRSA infection nor to guide or monitor treatment for MRSA infections. RESULT CALLED TO, READ BACK BY AND VERIFIED WITH: B. DAVIS RN, AT [phone] 10/07/18 BY Rush Landmark Performed at [facility], [place] [address] [place], [place] [address]   Urine Culture     Status: Abnormal   Collection Time: 10/07/18 10:30 AM  Result Value Ref Range Status   Specimen Description URINE, RANDOM  Final   Special Requests NONE  Final   Culture (A)  Final    <10,000 COLONIES/mL INSIGNIFICANT GROWTH Performed at [place] [address], [place], [place] [address]    Report Status 10/08/2018 FINAL  Final  Culture, blood (routine x 2)     Status: None   Collection Time: 10/09/18 10:21 AM  Result Value Ref Range Status   Specimen Description BLOOD RIGHT ANTECUBITAL  Final   Special Requests   Final    BOTTLES DRAWN AEROBIC ONLY Blood Culture adequate volume   Culture   Final    NO GROWTH 5 DAYS Performed at [facility], [place] [address], [place], [place] [address]    Report Status 10/14/2018 FINAL  Final  Culture, blood (routine x 2)     Status: None   Collection Time: 10/09/18 10:23 AM  Result Value Ref Range Status   Specimen Description BLOOD LEFT HAND  Final   Special Requests   Final    BOTTLES DRAWN AEROBIC ONLY Blood Culture results may not be optimal due to an inadequate volume of blood received in culture bottles   Culture   Final    NO GROWTH 5 DAYS Performed at [facility], [place] [address] [place], [place] [address]    Report Status  10/14/2018 FINAL  Final   Today   Subjective    Reana Kruck today has no new complaints, diarrhea improving, no fever no chills no abdominal pain,          Patient has been seen and examined prior to discharge   Objective   Blood pressure 115/88, pulse  96, temperature 98.3 F (36.8 C), temperature source Oral, resp. rate 17, height _0  (1.676 m), weight 77.1 kg, SpO2 100 %.   Intake/Output Summary (Last 24 hours) at 10/15/2018 1344 Last data filed at 10/15/2018 0900 Gross per 24 hour  Intake 480 ml  Output --  Net 480 ml   Exam Gen:- Awake Alert,  In no apparent distress  HEENT:- [place].AT, No sclera icterus Neck-Supple Neck,No JVD,.  Lungs-  CTAB , fair symmetrical air movement CV- S1, S2 normal, regular  Abd-  +ve B.Sounds, Abd Soft, No tenderness, no CVA area tenderness    Extremity/Skin:-  pedal pulses present/-- Wound on fibular side of right leg appears clean and dry with good granulation tissue. Continues to wear compression sock. Psych-affect is appropriate, oriented x3 Neuro-no new focal deficits, no tremors   Data Review   CBC w Diff:  Lab Results  Component Value Date   WBC 6.6 10/15/2018   HGB 10.2 (L) 10/15/2018   HCT 34.1 (L) 10/15/2018   PLT 272 10/15/2018    CMP:  Lab Results  Component Value Date   NA 137 10/15/2018   K 3.6 10/15/2018   CL 108 10/15/2018   CO2 22 10/15/2018   BUN 20 10/15/2018   CREATININE 0.66 10/15/2018   PROT 8.7 (H) 10/13/2018   ALBUMIN 2.4 (L) 10/13/2018   BILITOT 1.0 10/13/2018   ALKPHOS 356 (H) 10/13/2018   AST 30 10/13/2018   ALT 28 10/13/2018    Total Discharge time is about 33 minutes  Roxan Hockey M.D on 10/15/2018 at 1:44 PM  Go to [email or web address] -  for contact info  [facility] - Office  [phone]

## 2018-10-15 NOTE — Discharge Instructions (Signed)
1)Rt Leg---wear the sock and sleeve during waking hours and the socks only 24 hours a day, this should be changed daily. 2)Avoid ibuprofen/Advil/Aleve/Motrin/Goody Powders/Naproxen/BC powders/Meloxicam/Diclofenac/Indomethacin and other Nonsteroidal anti-inflammatory medications as these will make you more likely to bleed and can cause stomach ulcers, can also cause Kidney problems.  3)Follow up with Dr Sharol Given the orthopedic surgeon as scheduled on 10/16/2018 4) follow-up with infectious disease specialist Dr. Drucilla Schmidt as scheduled 5) continue IV vancomycin as advised through PICC line for 6 weeks through 11/20/2018 6) CBC, BMP, Vancomycin trough levels, ESR , CRP as per OPAT orders  Carbohydrate Counting for People with Diabetes Why Is Carbohydrate Counting Important? Counting carbohydrate servings may help you control your blood glucose level so that you feel better. The balance between the carbohydrates you eat and insulin determines what your blood glucose level will be after eating. Carbohydrate counting can also help you plan your meals. Which Foods Have Carbohydrates? Foods with carbohydrates include: Breads, crackers, and cereals Pasta, rice, and grains Starchy vegetables, such as potatoes, corn, and peas Beans and legumes Milk, soy milk, and yogurt Fruits and fruit juices Sweets, such as cakes, cookies, ice cream, jam, and jelly Carbohydrate Servings In diabetes meal planning, 1 serving of a food with carbohydrate has about 15 grams of carbohydrate: Check serving sizes with measuring cups and spoons or a food scale. Read the Nutrition Facts on food labels to find out how many grams of carbohydrate are in foods you eat. The food lists in this handout show portions that have about 15 grams of carbohydrate.  Tips Meal Planning Tips An Eating Plan tells you how many carbohydrate servings to eat at your meals and snacks. For many adults, eating 3 to 5 servings of carbohydrate foods at each  meal and 1 or 2 carbohydrate servings for each snack works well. In a healthy daily Eating Plan, most carbohydrates come from: At least 6 servings of fruits and nonstarchy vegetables At least 6 servings of grains, beans, and starchy vegetables, with at least 3 servings from whole grains At least 2 servings of milk or milk products Check your blood glucose level regularly. It can tell you if you need to adjust when you eat carbohydrates. Eating foods that have fiber, such as whole grains, and having very few salty foods is good for your health. Eat 4 to 6 ounces of meat or other protein foods (such as soybean burgers) each day. Choose low-fat sources of protein, such as lean beef, lean pork, chicken, fish, low-fat cheese, or vegetarian foods such as soy. Eat some healthy fats, such as olive oil, canola oil, and nuts. Eat very little saturated fats. These unhealthy fats are found in butter, cream, and high-fat meats, such as bacon and sausage. Eat very little or no trans fats. These unhealthy fats are found in all foods that list partially hydrogenated oil as an ingredient. Label Reading Tips The Nutrition Facts panel on a label lists the grams of total carbohydrate in 1 standard serving. The labels standard serving may be larger or smaller than 1 carbohydrate serving. To figure out how many carbohydrate servings are in the food: First, look at the labels standard serving size. Check the grams of total carbohydrate. This is the amount of carbohydrate in 1 standard serving. Divide the grams of total carbohydrate by 15. This number equals the number of carbohydrate servings in 1 standard serving. Remember: 1 carbohydrate serving is 15 grams of carbohydrate. Note: You may ignore the grams of sugars on  the Nutrition Facts panel because they are included in the grams of total carbohydrate.  Foods Recommended 1 serving = about 15 grams of carbohydrate Starches 1 slice bread (1 ounce) 1  tortilla (6-inch size)  large bagel (1 ounce) 2 taco shells (5-inch size)  hamburger or hot dog bun ( ounce)  cup ready-to-eat unsweetened cereal  cup cooked cereal 1 cup broth-based soup 4 to 6 small crackers 1/3 cup pasta or rice (cooked)  cup beans, peas, corn, sweet potatoes, winter squash, or mashed or boiled potatoes (cooked)  large baked potato (3 ounces)  ounce pretzels, potato chips, or tortilla chips 3 cups popcorn (popped) Fruit 1 small fresh fruit ( to 1 cup)  cup canned or frozen fruit 2 tablespoons dried fruit (blueberries, cherries, cranberries, mixed fruit, raisins) 17 small grapes (3 ounces) 1 cup melon or berries  cup unsweetened fruit juice Milk 1 cup fat-free or reduced-fat milk 1 cup soy milk 2/3 cup (6 ounces) nonfat yogurt sweetened with sugar-free sweetener Sweets and Desserts 2-inch square cake (unfrosted) 2 small cookies (2/3 ounce)  cup ice cream or frozen yogurt  cup sherbet or sorbet 1 tablespoon syrup, jam, jelly, table sugar, or honey 2 tablespoons light syrup Other Foods Count 1 cup raw vegetables or  cup cooked nonstarchy vegetables as zero (0) carbohydrate servings or free foods. If you eat 3 or more servings at one meal, count them as 1 carbohydrate serving. Foods that have less than 20 calories in each serving also may be counted as zero carbohydrate servings or free foods. Count 1 cup of casserole or other mixed foods as 2 carbohydrate servings.  Carbohydrate Counting for People with Diabetes Sample 1-Day Menu Breakfast 1 extra-small banana (1 carbohydrate serving) 3/4 cup corn flakes (1 carbohydrate serving) 1 cup low-fat or fat-free milk (1 carbohydrate serving) 1 slice whole wheat bread (1 carbohydrate serving) 1 teaspoon margarine Lunch 2 ounces [place] slices 2 slices whole wheat bread (2 carbohydrate servings) 2 lettuce leaves 4 celery sticks 4 carrot sticks 1 medium apple (1 carbohydrate serving) 1 cup  low-fat or fat-free milk (1 carbohydrate serving) Afternoon Snack 2 tablespoons raisins (1 carbohydrate serving) 3/4 ounce unsalted mini pretzels (1 carbohydrate serving) Evening Meal 3 ounces lean roast beef 1/2 large baked potato (2 carbohydrate servings) 1 tablespoon reduced-fat sour cream 1/2 cup green beans 1 cup vegetable salad 1 tablespoon light salad dressing 1 whole wheat dinner roll (1 carbohydrate serving) 1 teaspoon margarine 1 cup melon balls (1 carbohydrate serving) Evening Snack 6 ounces low-fat sugar-free fruit yogurt (1 carbohydrate serving) 2 tablespoons unsalted nuts   Carbohydrate Counting for People with Diabetes Vegan Sample 1-Day Menu Breakfast 1 cup cooked oatmeal (2 carbohydrate servings)  cup blueberries (1 carbohydrate serving) 2 tablespoons flaxseeds 1 cup soymilk fortified with calcium and vitamin D 1 cup coffee Lunch 2 slices whole wheat bread (2 carbohydrate servings)  cup baked tofu  cup lettuce 2 slices tomato 2 slices avocado  cup baby carrots 1 orange (1 carbohydrate serving) 1 cup soymilk fortified with calcium and vitamin D Evening Meal Burrito made with: 1 6-inch corn tortilla (1 carbohydrate serving) 1 cup refried vegetarian beans (1 carbohydrate serving)  cup chopped tomatoes  cup lettuce  cup salsa 1/3 cup brown rice (1 carbohydrate serving) 1 tablespoon olive oil for rice  cup zucchini Evening Snack 6 small whole grain crackers (1 carbohydrate serving) 2 apricots ( carbohydrate serving)  cup unsalted peanuts ( carbohydrate serving)   Carbohydrate Counting for People with Diabetes  Vegetarian (Lacto-Ovo) Sample 1-Day Menu Breakfast 1 cup cooked oatmeal (2 carbohydrate servings)  cup blueberries (1 carbohydrate serving) 2 tablespoons flaxseeds 1 egg 1 cup 1% milk (1 carbohydrate serving) 1 cup coffee Lunch 2 slices whole wheat bread (2 carbohydrate servings) 2 ounces low-fat cheese  cup lettuce 2 slices  tomato 2 slices avocado  cup baby carrots 1 orange (1 carbohydrate serving) 1 cup unsweetened tea Evening Meal Burrito made with: 1 6-inch corn tortilla (1 carbohydrate serving)  cup refried vegetarian beans (1 carbohydrate serving)  cup tomatoes  cup lettuce  cup salsa 1/3 cup brown rice (1 carbohydrate serving) 1 tablespoon olive oil for rice  cup zucchini 1 cup 1% milk (1 carbohydrate serving) Evening Snack 6 small whole grain crackers (1 carbohydrate serving) 2 apricots ( carbohydrate serving)  cup unsalted peanuts ( carbohydrate serving)   1)Rt Leg---wear the sock and sleeve during waking hours and the socks only 24 hours a day, this should be changed daily. 2)Avoid ibuprofen/Advil/Aleve/Motrin/Goody Powders/Naproxen/BC powders/Meloxicam/Diclofenac/Indomethacin and other Nonsteroidal anti-inflammatory medications as these will make you more likely to bleed and can cause stomach ulcers, can also cause Kidney problems.  3)Follow up with Dr Sharol Given the orthopedic surgeon as scheduled on 10/16/2018 4) follow-up with infectious disease specialist Dr. Drucilla Schmidt as scheduled 5) continue IV vancomycin as advised through PICC line for 6 weeks through 11/20/2018 6) CBC, BMP, Vancomycin trough levels, ESR , CRP as per OPAT orders

## 2018-10-15 NOTE — TOC Transition Note (Signed)
Transition of Care [facility]) - CM/SW Discharge Note   Patient Details  Name: Yolanda [name] MRN: [id number] Date of Birth: 26-May-1976  Transition of Care [facility]) CM/SW Contact:  Durenda Guthrie, RN Phone Number: 10/15/2018, 9:27 AM   Clinical Narrative: 43 yr old female admitted with osteomyelitis of right foot and ankle. Patient will have PICC line placed and discharge Home on IV Vanc every 12 hrs until 11/20/18. Patient will  Discharge home with her parents, address is: [address] [place], [place]. Patient will have family support at discharge. Patient has  Had previous experience with IV antibiotics .   Final next level of care: Home w Home Health Services Barriers to Discharge: No Barriers Identified   Patient Goals and CMS Choice     Choice offered to / list presented to : Patient  Discharge Placement                       Discharge Plan and Services   Discharge Planning Services: CM Consult Post Acute Care Choice: Home Health          DME Arranged: N/A DME Agency: NA HH Arranged: PT HH Agency: [facility] [place]   Social Determinants of Health (SDOH) Interventions     Readmission Risk Interventions No flowsheet data found.

## 2018-10-16 ENCOUNTER — Ambulatory Visit (INDEPENDENT_AMBULATORY_CARE_PROVIDER_SITE_OTHER): Payer: Self-pay | Admitting: Orthopedic Surgery

## 2018-10-17 ENCOUNTER — Encounter (INDEPENDENT_AMBULATORY_CARE_PROVIDER_SITE_OTHER): Payer: Self-pay | Admitting: Family

## 2018-10-17 ENCOUNTER — Other Ambulatory Visit: Payer: Self-pay

## 2018-10-17 ENCOUNTER — Ambulatory Visit (INDEPENDENT_AMBULATORY_CARE_PROVIDER_SITE_OTHER): Payer: Medicaid - Out of State | Admitting: Family

## 2018-10-17 VITALS — Ht 66.0 in | Wt 170.0 lb

## 2018-10-17 DIAGNOSIS — E1142 Type 2 diabetes mellitus with diabetic polyneuropathy: Secondary | ICD-10-CM | POA: Diagnosis not present

## 2018-10-17 DIAGNOSIS — Z95828 Presence of other vascular implants and grafts: Secondary | ICD-10-CM | POA: Diagnosis not present

## 2018-10-17 DIAGNOSIS — R7881 Bacteremia: Secondary | ICD-10-CM

## 2018-10-17 DIAGNOSIS — S81801S Unspecified open wound, right lower leg, sequela: Secondary | ICD-10-CM

## 2018-10-17 NOTE — Progress Notes (Signed)
Office Visit Note   Patient: Yolanda [name]           Date of Birth: 1976-06-04           MRN: [id number] Visit Date: 10/17/2018              Requested by: No referring provider defined for this encounter. PCP: System, Pcp Not In  Chief Complaint  Patient presents with  . Right Leg - Wound Check    F/u from hospital admission  10/07/18      HPI: The patient is a 43 year old woman seen today in hospitalization follow up for chronic wound to lateral aspect of right leg. Had been undergoing wound care for several months prior to hospitalization 10/07/18 - 10/15/18. Wound was initially positive for MRSA. Had been undergoing wound vac treatment in [place]. Currently on IV abx with Picc in place.   Was discharged from [facility] on PICC as well. Using the Vive medical compression stocking. Advanced assisting with PICC and Abx. Wearing a medical compression stocking that was placed by nursing staff on 4/6. No concerns voiced.   Does have a CAM walker in place today. This is broken down. velcro straps have come loose from the boot. Using a walker as well for ambulation.  Of note, she has a hemoglobin A1c of 11 and an albumin of 2.2 consistent with uncontrolled diabetes and severe protein caloric malnutrition.  Assessment & Plan: Visit Diagnoses: No diagnosis found.  Plan: plan to continue for course of 4-6 weeks of IV abx. Medical compression stocking to be changed daily following wound cleansing. Discussed proper cleaning of sock. HH, advanced, to assist. Follow up in office in 1 week. Will follow closely for first week on home therapy.   Follow-Up Instructions: No follow-ups on file.   Ortho Exam  Patient is alert, oriented, no adenopathy, well-dressed, normal affect, normal respiratory effort. To the right lower extremity has trace edema to foot. Strong DP. Wound is 19 cm x 3 cm. No depth. Is granulating in. There is scant, 10%, fibrinous tissue. This was debrided. There is no active drainage.  Wound bed flat. No surrounding maceration. No erythema or warmth. No odor.   Imaging: No results found. No images are attached to the encounter.  Labs: Lab Results  Component Value Date   HGBA1C 11.0 (H) 10/07/2018   REPTSTATUS 10/14/2018 FINAL 10/09/2018   CULT  10/09/2018    NO GROWTH 5 DAYS Performed at [facility], [address], [place], [place] [address]    LABORGA METHICILLIN RESISTANT STAPHYLOCOCCUS AUREUS 10/07/2018     Lab Results  Component Value Date   ALBUMIN 2.4 (L) 10/13/2018   ALBUMIN 2.3 (L) 10/10/2018   ALBUMIN 2.2 (L) 10/09/2018    Body mass index is 27.44 kg/m.  Orders:  No orders of the defined types were placed in this encounter.  No orders of the defined types were placed in this encounter.    Procedures: No procedures performed  Clinical Data: No additional findings.  ROS:  All other systems negative, except as noted in the HPI. Review of Systems  Constitutional: Negative for chills and fever.  Cardiovascular: Negative for leg swelling.  Skin: Positive for wound. Negative for color change.    Objective: Vital Signs: Ht 5\' 6"  (1.676 m)   Wt 170 lb (77.1 kg)   BMI 27.44 kg/m   Specialty Comments:  No specialty comments available.  PMFS History: Patient Active Problem List   Diagnosis  Date Noted  . S/P PICC central line placement   . Abnormal LFTs   . Cigarette smoker 10/08/2018  . Osteomyelitis of ankle and foot (HCC) 10/07/2018  . Diabetes mellitus type 2, uncontrolled, with complications (HCC) 10/07/2018  . MRSA bacteremia 10/07/2018  . 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 Severe protein-calorie malnutrition (HCC)   . Leg wound, right, sequela   . Diabetic polyneuropathy associated with type 2 diabetes mellitus (HCC)    Past Medical History:  Diagnosis Date  . MRSA infection within last 3 months   . Type 2 diabetes mellitus treated with insulin (HCC)     No family history on file.  Past Surgical  History:  Procedure Laterality Date  . IRRIGATION AND DEBRIDEMENT KNEE Left    Social History   Occupational History  . Not on file  Tobacco Use  . Smoking status: Current Some Day Smoker    Years: 3.00    Types: Cigarettes  . Smokeless tobacco: Never Used  Substance and Sexual Activity  . Alcohol use: Not Currently  . Drug use: Never  . Sexual activity: Not on file

## 2018-10-22 ENCOUNTER — Telehealth: Payer: Self-pay | Admitting: Behavioral Health

## 2018-10-22 NOTE — Telephone Encounter (Signed)
Arline Asp from [facility] called stating she was with patient today t draw labs.  Patient has been complaining of constant itching all over. Nurse states patient does not have a visible rash, PICC site looks good, and Vital signs are stable.  Nurse advised patient to take Benadryl however nurse wanted to make sure Dr. Daiva Eves was made aware and to make sure it is ok for patient to take Benadryl or if something else needs to be done.  She states patient made staff aware in the hospital as well when she was started on Vancomycin. Angeline Slim RN

## 2018-10-22 NOTE — Telephone Encounter (Signed)
They also try could try to slow the infusion of vancomycin further.

## 2018-10-23 NOTE — Telephone Encounter (Signed)
[facility] and informed her per Dr. Daiva Eves that Yolanda [name] can also slow the infusion of Vancomycin as well.  Arline Asp states she will informed patient and educated her about this. Angeline Slim RN

## 2018-10-24 ENCOUNTER — Ambulatory Visit (INDEPENDENT_AMBULATORY_CARE_PROVIDER_SITE_OTHER): Payer: Medicaid - Out of State | Admitting: Family

## 2018-10-24 ENCOUNTER — Other Ambulatory Visit: Payer: Self-pay

## 2018-10-24 ENCOUNTER — Encounter (INDEPENDENT_AMBULATORY_CARE_PROVIDER_SITE_OTHER): Payer: Self-pay | Admitting: Family

## 2018-10-24 VITALS — Ht 66.0 in | Wt 170.0 lb

## 2018-10-24 DIAGNOSIS — E11610 Type 2 diabetes mellitus with diabetic neuropathic arthropathy: Secondary | ICD-10-CM

## 2018-10-24 DIAGNOSIS — M869 Osteomyelitis, unspecified: Secondary | ICD-10-CM

## 2018-10-24 DIAGNOSIS — Z95828 Presence of other vascular implants and grafts: Secondary | ICD-10-CM

## 2018-10-24 NOTE — Progress Notes (Signed)
Office Visit Note   Patient: Yolanda [name]           Date of Birth: 1976-06-27           MRN: [id number] Visit Date: 10/24/2018              Requested by: No referring provider defined for this encounter. PCP: System, Pcp Not In  Chief Complaint  Patient presents with  . Right Leg - Follow-up      HPI: The patient is a 43 year old woman seen today in hospitalization follow up for chronic wound to lateral aspect of right leg. Had been undergoing wound care for several months prior to hospitalization 10/07/18 - 10/15/18. Wound was initially positive for MRSA. Had been [place] wound vac [place] in [place]. Currently on IV abx with Picc in place.   [facility]. Using the Vive medical compression stocking. Advanced assisting with PICC and Abx. Wearing a medical compression stocking. Is changing this daily following wound cleansing.  Does have a CAM walker in place today. Using a walker as well for ambulation.  Of note, she has a hemoglobin A1c of 11 and an albumin of 2.2 consistent with uncontrolled diabetes and severe protein caloric malnutrition.  Assessment & Plan: Visit Diagnoses:  1. S/P PICC central line placement   2. Osteomyelitis of ankle and foot (HCC)   3. Charcot foot due to diabetes mellitus (HCC)     Plan: plan to continue for course of 4-6 weeks of IV abx. Medical compression stocking to be changed daily following wound cleansing. Plan to follow up in office in 2 weeks. Call with any concerns.   Follow-Up Instructions: Return in about 15 days (around 11/08/2018).   Ortho Exam  Patient is alert, oriented, no adenopathy, well-dressed, normal affect, normal respiratory effort. To the right lower extremity has trace edema to foot. Strong DP. Wound is 15 cm x 19 mm. No depth. Is granulating in. There is scant fibrinous tissue. This was debrided. There is no active drainage. Wound bed flat. No surrounding maceration. No erythema or warmth. No  odor.   Imaging: No results found. No images are attached to the encounter.  Labs: Lab Results  Component Value Date   HGBA1C 11.0 (H) 10/07/2018   REPTSTATUS 10/14/2018 FINAL 10/09/2018   CULT  10/09/2018    NO GROWTH 5 DAYS Performed at [facility], [address] [place], [place] [id number]    LABORGA METHICILLIN RESISTANT STAPHYLOCOCCUS AUREUS 10/07/2018     Lab Results  Component Value Date   ALBUMIN 2.4 (L) 10/13/2018   ALBUMIN 2.3 (L) 10/10/2018   ALBUMIN 2.2 (L) 10/09/2018    Body mass index is 27.44 kg/m.  Orders:  No orders of the defined types were placed in this encounter.  No orders of the defined types were placed in this encounter.    Procedures: No procedures performed  Clinical Data: No additional findings.  ROS:  All other systems negative, except as noted in the HPI. Review of Systems  Constitutional: Negative for chills and fever.  Cardiovascular: Negative for leg swelling.  Skin: Positive for wound. Negative for color change.    Objective: Vital Signs: Ht 5\' 6"  (1.676 m)   Wt 170 lb (77.1 kg)   BMI 27.44 kg/m   Specialty Comments:  No specialty comments available.  PMFS History: Patient Active Problem List   Diagnosis Date Noted  . S/P PICC central line placement   .  Abnormal LFTs   . Cigarette smoker 10/08/2018  . Osteomyelitis of ankle and foot (HCC) 10/07/2018  . Diabetes mellitus type 2, uncontrolled, with complications (HCC) 10/07/2018  . MRSA bacteremia 10/07/2018  . 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 Severe protein-calorie malnutrition (HCC)   . Leg wound, right, sequela   . Diabetic polyneuropathy associated with type 2 diabetes mellitus (HCC)    Past Medical History:  Diagnosis Date  . MRSA infection within last 3 months   . Type 2 diabetes mellitus treated with insulin (HCC)     History reviewed. No pertinent family history.  Past Surgical History:  Procedure Laterality Date  .  IRRIGATION AND DEBRIDEMENT KNEE Left    Social History   Occupational History  . Not on file  Tobacco Use  . Smoking status: Current Some Day Smoker    Years: 3.00    Types: Cigarettes  . Smokeless tobacco: Never Used  Substance and Sexual Activity  . Alcohol use: Not Currently  . Drug use: Never  . Sexual activity: Not on file

## 2018-11-08 ENCOUNTER — Encounter (INDEPENDENT_AMBULATORY_CARE_PROVIDER_SITE_OTHER): Payer: Self-pay | Admitting: Orthopedic Surgery

## 2018-11-08 ENCOUNTER — Ambulatory Visit (INDEPENDENT_AMBULATORY_CARE_PROVIDER_SITE_OTHER): Payer: Medicaid - Out of State | Admitting: Orthopedic Surgery

## 2018-11-08 ENCOUNTER — Ambulatory Visit (INDEPENDENT_AMBULATORY_CARE_PROVIDER_SITE_OTHER): Payer: Medicaid - Out of State

## 2018-11-08 ENCOUNTER — Telehealth: Payer: Self-pay | Admitting: Orthopedic Surgery

## 2018-11-08 ENCOUNTER — Other Ambulatory Visit: Payer: Self-pay

## 2018-11-08 VITALS — Ht 66.0 in | Wt 170.0 lb

## 2018-11-08 DIAGNOSIS — S81801S Unspecified open wound, right lower leg, sequela: Secondary | ICD-10-CM

## 2018-11-08 DIAGNOSIS — E11610 Type 2 diabetes mellitus with diabetic neuropathic arthropathy: Secondary | ICD-10-CM | POA: Diagnosis not present

## 2018-11-08 DIAGNOSIS — M869 Osteomyelitis, unspecified: Secondary | ICD-10-CM

## 2018-11-08 NOTE — Telephone Encounter (Signed)
pls advise

## 2018-11-08 NOTE — Telephone Encounter (Signed)
Dollene Primrose RN calling in reference to patient finishing IV antibiotics.  A verbal order is needed so that she can take out [facility] line  Fax  [phone]  cb  [phone]

## 2018-11-08 NOTE — Progress Notes (Signed)
Office Visit Note   Patient: Yolanda [name]           Date of Birth: 1976/05/24           MRN: [id number] Visit Date: 11/08/2018              Requested by: No referring provider defined for this encounter. PCP: System, Pcp Not In  Chief Complaint  Patient presents with  . Right Leg - Follow-up      HPI: Patient is a 43 year old woman who presents in follow-up for 2 issues.  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  #2 IV antibiotic treatment for osteomyelitis of the fibula and a chronic wound over the fibula.  Assessment & Plan: Visit Diagnoses:  1. Osteomyelitis of ankle and foot (HCC)   2. 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 Leg wound, right, sequela     Plan: Plan: Patient will go ahead and complete her IV antibiotics.  She will continue with the medical compression stocking.  She will continue with the fracture boot with weightbearing as tolerated.  Follow-Up Instructions: Return in about 3 weeks (around 11/29/2018).   Ortho Exam  Patient is alert, oriented, no adenopathy, well-dressed, normal affect, normal respiratory effort. Examination there is excellent epithelialization of the lateral wound with wearing the compression stocking.  The wound bed has decreased in size it has good healthy granulation tissue no epiboly this measures 10 x 1 cm 0.1 mm deep.  Patient's foot is plantigrade but she does have significant swelling through the midfoot and hindfoot consistent with the Charcot collapse through the talus.  Imaging: Xr Ankle Complete Right  Result Date: 11/08/2018 3 view radiographs of the right ankle shows a congruent mortise with no destructive changes of the fibula.  Patient has Charcot collapse through the talar neck with peripheral vascular disease and calcification of the arteries down to the ankle.  No images are attached to the encounter.  Labs: Lab Results  Component Value Date   HGBA1C 11.0 (H) 10/07/2018   REPTSTATUS  10/14/2018 FINAL 10/09/2018   CULT  10/09/2018    NO GROWTH 5 DAYS Performed at [facility], [address] [place], [place] [address]    LABORGA METHICILLIN RESISTANT STAPHYLOCOCCUS AUREUS 10/07/2018     Lab Results  Component Value Date   ALBUMIN 2.4 (L) 10/13/2018   ALBUMIN 2.3 (L) 10/10/2018   ALBUMIN 2.2 (L) 10/09/2018    Body mass index is 27.44 kg/m.  Orders:  Orders Placed This Encounter  Procedures  . XR Ankle Complete Right   No orders of the defined types were placed in this encounter.    Procedures: No procedures performed  Clinical Data: No additional findings.  ROS:  All other systems negative, except as noted in the HPI. Review of Systems  Objective: Vital Signs: Ht 5\' 6"  (1.676 m)   Wt 170 lb (77.1 kg)   BMI 27.44 kg/m   Specialty Comments:  No specialty comments available.  PMFS History: Patient Active Problem List   Diagnosis Date Noted  . S/P PICC central line placement   . Abnormal LFTs   . Cigarette smoker 10/08/2018  . Osteomyelitis of ankle and foot (HCC) 10/07/2018  . Diabetes mellitus type 2, uncontrolled, with complications (HCC) 10/07/2018  . MRSA bacteremia 10/07/2018  . 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 Severe protein-calorie malnutrition (HCC)   . Leg wound, right, sequela   . Diabetic polyneuropathy  associated with type 2 diabetes mellitus (HCC)    Past Medical History:  Diagnosis Date  . MRSA infection within last 3 months   . Type 2 diabetes mellitus treated with insulin (HCC)     History reviewed. No pertinent family history.  Past Surgical History:  Procedure Laterality Date  . IRRIGATION AND DEBRIDEMENT KNEE Left    Social History   Occupational History  . Not on file  Tobacco Use  . Smoking status: Current Some Day Smoker    Years: 3.00    Types: Cigarettes  . Smokeless tobacco: Never Used  Substance and Sexual Activity  . Alcohol use: Not Currently  . Drug use: Never  .  Sexual activity: Not on file

## 2018-11-09 NOTE — Telephone Encounter (Signed)
Tried calling to advise per Dr Lajoyce Corners. I was not able to get anyone to answer. Can you please follow up on this before you leave? thanks

## 2018-11-09 NOTE — Telephone Encounter (Signed)
Ok, will follow up

## 2018-11-09 NOTE — Telephone Encounter (Signed)
Please send order to d/c picc line

## 2018-11-09 NOTE — Telephone Encounter (Signed)
Pt was called and stated that RN was on her way to her home to D/C picc line

## 2018-11-12 ENCOUNTER — Inpatient Hospital Stay: Payer: Medicaid - Out of State | Admitting: Infectious Disease

## 2018-11-28 ENCOUNTER — Inpatient Hospital Stay (HOSPITAL_COMMUNITY)
Admission: AD | Admit: 2018-11-28 | Discharge: 2018-12-25 | DRG: 853 | Disposition: A | Discharge status: Home Health | Payer: Medicaid Other | Source: Other Acute Inpatient Hospital | Attending: Internal Medicine | Admitting: Internal Medicine

## 2018-11-28 DIAGNOSIS — L02611 Cutaneous abscess of right foot: Secondary | ICD-10-CM | POA: Diagnosis not present

## 2018-11-28 DIAGNOSIS — R7881 Bacteremia: Secondary | ICD-10-CM | POA: Diagnosis present

## 2018-11-28 DIAGNOSIS — Z8614 Personal history of Methicillin resistant Staphylococcus aureus infection: Secondary | ICD-10-CM | POA: Diagnosis not present

## 2018-11-28 DIAGNOSIS — E11649 Type 2 diabetes mellitus with hypoglycemia without coma: Secondary | ICD-10-CM | POA: Diagnosis not present

## 2018-11-28 DIAGNOSIS — Z720 Tobacco use: Secondary | ICD-10-CM | POA: Diagnosis not present

## 2018-11-28 DIAGNOSIS — M86271 Subacute osteomyelitis, right ankle and foot: Secondary | ICD-10-CM | POA: Diagnosis present

## 2018-11-28 DIAGNOSIS — IMO0002 Reserved for concepts with insufficient information to code with codable children: Secondary | ICD-10-CM | POA: Diagnosis present

## 2018-11-28 DIAGNOSIS — R197 Diarrhea, unspecified: Secondary | ICD-10-CM | POA: Diagnosis present

## 2018-11-28 DIAGNOSIS — E1169 Type 2 diabetes mellitus with other specified complication: Secondary | ICD-10-CM | POA: Diagnosis not present

## 2018-11-28 DIAGNOSIS — L02619 Cutaneous abscess of unspecified foot: Secondary | ICD-10-CM | POA: Diagnosis present

## 2018-11-28 DIAGNOSIS — Z8249 Family history of ischemic heart disease and other diseases of the circulatory system: Secondary | ICD-10-CM

## 2018-11-28 DIAGNOSIS — Z7901 Long term (current) use of anticoagulants: Secondary | ICD-10-CM | POA: Diagnosis not present

## 2018-11-28 DIAGNOSIS — D62 Acute posthemorrhagic anemia: Secondary | ICD-10-CM | POA: Diagnosis not present

## 2018-11-28 DIAGNOSIS — L089 Local infection of the skin and subcutaneous tissue, unspecified: Secondary | ICD-10-CM | POA: Diagnosis not present

## 2018-11-28 DIAGNOSIS — E43 Unspecified severe protein-calorie malnutrition: Secondary | ICD-10-CM | POA: Diagnosis present

## 2018-11-28 DIAGNOSIS — E11628 Type 2 diabetes mellitus with other skin complications: Secondary | ICD-10-CM | POA: Diagnosis present

## 2018-11-28 DIAGNOSIS — E11610 Type 2 diabetes mellitus with diabetic neuropathic arthropathy: Secondary | ICD-10-CM | POA: Diagnosis present

## 2018-11-28 DIAGNOSIS — I10 Essential (primary) hypertension: Secondary | ICD-10-CM | POA: Diagnosis present

## 2018-11-28 DIAGNOSIS — E118 Type 2 diabetes mellitus with unspecified complications: Secondary | ICD-10-CM | POA: Diagnosis not present

## 2018-11-28 DIAGNOSIS — R011 Cardiac murmur, unspecified: Secondary | ICD-10-CM | POA: Diagnosis not present

## 2018-11-28 DIAGNOSIS — A4102 Sepsis due to Methicillin resistant Staphylococcus aureus: Secondary | ICD-10-CM | POA: Diagnosis present

## 2018-11-28 DIAGNOSIS — Z8739 Personal history of other diseases of the musculoskeletal system and connective tissue: Secondary | ICD-10-CM | POA: Diagnosis not present

## 2018-11-28 DIAGNOSIS — B9562 Methicillin resistant Staphylococcus aureus infection as the cause of diseases classified elsewhere: Secondary | ICD-10-CM | POA: Diagnosis present

## 2018-11-28 DIAGNOSIS — I340 Nonrheumatic mitral (valve) insufficiency: Secondary | ICD-10-CM | POA: Diagnosis not present

## 2018-11-28 DIAGNOSIS — M71032 Abscess of bursa, left wrist: Secondary | ICD-10-CM

## 2018-11-28 DIAGNOSIS — Z794 Long term (current) use of insulin: Secondary | ICD-10-CM

## 2018-11-28 DIAGNOSIS — E1165 Type 2 diabetes mellitus with hyperglycemia: Secondary | ICD-10-CM | POA: Diagnosis present

## 2018-11-28 DIAGNOSIS — E860 Dehydration: Secondary | ICD-10-CM | POA: Diagnosis present

## 2018-11-28 DIAGNOSIS — R339 Retention of urine, unspecified: Secondary | ICD-10-CM | POA: Diagnosis present

## 2018-11-28 DIAGNOSIS — F17210 Nicotine dependence, cigarettes, uncomplicated: Secondary | ICD-10-CM | POA: Diagnosis present

## 2018-11-28 DIAGNOSIS — E11621 Type 2 diabetes mellitus with foot ulcer: Secondary | ICD-10-CM | POA: Diagnosis not present

## 2018-11-28 DIAGNOSIS — L02512 Cutaneous abscess of left hand: Secondary | ICD-10-CM

## 2018-11-28 DIAGNOSIS — M25432 Effusion, left wrist: Secondary | ICD-10-CM

## 2018-11-28 DIAGNOSIS — E1142 Type 2 diabetes mellitus with diabetic polyneuropathy: Secondary | ICD-10-CM | POA: Diagnosis present

## 2018-11-28 DIAGNOSIS — Z1159 Encounter for screening for other viral diseases: Secondary | ICD-10-CM | POA: Diagnosis not present

## 2018-11-28 DIAGNOSIS — Z86718 Personal history of other venous thrombosis and embolism: Secondary | ICD-10-CM

## 2018-11-28 DIAGNOSIS — Z89511 Acquired absence of right leg below knee: Secondary | ICD-10-CM | POA: Diagnosis not present

## 2018-11-28 HISTORY — DX: Local infection of the skin and subcutaneous tissue, unspecified: L08.9

## 2018-11-28 LAB — VANCOMYCIN, RANDOM: Vancomycin Rm: 10

## 2018-11-28 LAB — ABO/RH: ABO/RH(D): A POS

## 2018-11-28 LAB — GLUCOSE, CAPILLARY: Glucose-Capillary: 300 mg/dL — ABNORMAL HIGH (ref 70–99)

## 2018-11-28 LAB — CBC
HCT: 29 % — ABNORMAL LOW (ref 36.0–46.0)
Hemoglobin: 9.5 g/dL — ABNORMAL LOW (ref 12.0–15.0)
MCH: 30 pg (ref 26.0–34.0)
MCHC: 32.8 g/dL (ref 30.0–36.0)
MCV: 91.5 fL (ref 80.0–100.0)
Platelets: 405 10*3/uL — ABNORMAL HIGH (ref 150–400)
RBC: 3.17 MIL/uL — ABNORMAL LOW (ref 3.87–5.11)
RDW: 13.2 % (ref 11.5–15.5)
WBC: 21.3 10*3/uL — ABNORMAL HIGH (ref 4.0–10.5)
nRBC: 0.1 % (ref 0.0–0.2)

## 2018-11-28 LAB — HEMOGLOBIN A1C
Hgb A1c MFr Bld: 7.9 % — ABNORMAL HIGH (ref 4.8–5.6)
Mean Plasma Glucose: 180.03 mg/dL

## 2018-11-28 LAB — COMPREHENSIVE METABOLIC PANEL
ALT: 22 U/L (ref 0–44)
AST: 33 U/L (ref 15–41)
Albumin: 1.4 g/dL — ABNORMAL LOW (ref 3.5–5.0)
Alkaline Phosphatase: 287 U/L — ABNORMAL HIGH (ref 38–126)
Anion gap: 9 (ref 5–15)
BUN: 19 mg/dL (ref 6–20)
CO2: 22 mmol/L (ref 22–32)
Calcium: 10.5 mg/dL — ABNORMAL HIGH (ref 8.9–10.3)
Chloride: 103 mmol/L (ref 98–111)
Creatinine, Ser: 0.89 mg/dL (ref 0.44–1.00)
GFR calc Af Amer: >60 mL/min (ref >60)
GFR calc non Af Amer: >60 mL/min (ref >60)
Glucose, Bld: 232 mg/dL — ABNORMAL HIGH (ref 70–99)
Potassium: 4.1 mmol/L (ref 3.5–5.1)
Sodium: 134 mmol/L — ABNORMAL LOW (ref 135–145)
Total Bilirubin: 3 mg/dL — ABNORMAL HIGH (ref 0.3–1.2)
Total Protein: 8.6 g/dL — ABNORMAL HIGH (ref 6.5–8.1)

## 2018-11-28 LAB — LACTIC ACID, PLASMA: Lactic Acid, Venous: 1.4 mmol/L (ref 0.5–1.9)

## 2018-11-28 LAB — C-REACTIVE PROTEIN: CRP: 22.1 mg/dL — ABNORMAL HIGH (ref <1.0)

## 2018-11-28 LAB — SARS CORONAVIRUS 2 BY RT PCR (HOSPITAL ORDER, PERFORMED IN ~~LOC~~ HOSPITAL LAB): SARS Coronavirus 2: NEGATIVE

## 2018-11-28 LAB — MRSA PCR SCREENING: MRSA by PCR: POSITIVE — AB

## 2018-11-28 LAB — SEDIMENTATION RATE: Sed Rate: 136 mm/hr — ABNORMAL HIGH (ref 0–22)

## 2018-11-28 MED ORDER — ACETAMINOPHEN 325 MG PO TABS
650.0000 mg | ORAL_TABLET | Freq: Four times a day (QID) | ORAL | Status: DC | PRN
  Administered 2018-12-01: 650 mg via ORAL
  Filled 2018-11-28 (×2): qty 2

## 2018-11-28 MED ORDER — INSULIN GLARGINE 100 UNIT/ML ~~LOC~~ SOLN
30.0000 [IU] | Freq: Two times a day (BID) | SUBCUTANEOUS | Status: DC
  Administered 2018-11-28 – 2018-12-01 (×5): 30 [IU] via SUBCUTANEOUS
  Filled 2018-11-28 (×8): qty 0.3

## 2018-11-28 MED ORDER — ACETAMINOPHEN 650 MG RE SUPP: 650.0000 mg | Freq: Four times a day (QID) | RECTAL | Status: DC | PRN

## 2018-11-28 MED ORDER — CHLORHEXIDINE GLUCONATE CLOTH 2 % EX PADS
6.0000 | MEDICATED_PAD | Freq: Every day | CUTANEOUS | Status: AC
  Administered 2018-11-29 – 2018-12-03 (×5): 6 via TOPICAL

## 2018-11-28 MED ORDER — POLYETHYLENE GLYCOL 3350 17 G PO PACK: 17.0000 g | PACK | Freq: Every day | ORAL | Status: DC | PRN

## 2018-11-28 MED ORDER — VANCOMYCIN HCL 10 G IV SOLR
1750.0000 mg | INTRAVENOUS | Status: DC
  Administered 2018-11-28 – 2018-11-30 (×3): 1750 mg via INTRAVENOUS
  Filled 2018-11-28 (×4): qty 1750

## 2018-11-28 MED ORDER — INSULIN ASPART 100 UNIT/ML ~~LOC~~ SOLN
0.0000 [IU] | Freq: Every day | SUBCUTANEOUS | Status: DC
  Administered 2018-11-28: 3 [IU] via SUBCUTANEOUS
  Administered 2018-11-30: 5 [IU] via SUBCUTANEOUS
  Administered 2018-12-07: 3 [IU] via SUBCUTANEOUS
  Administered 2018-12-12: 23:00:00 4 [IU] via SUBCUTANEOUS
  Administered 2018-12-13: 3 [IU] via SUBCUTANEOUS
  Administered 2018-12-16: 2 [IU] via SUBCUTANEOUS
  Administered 2018-12-18: 3 [IU] via SUBCUTANEOUS

## 2018-11-28 MED ORDER — ONDANSETRON HCL 4 MG/2ML IJ SOLN
4.0000 mg | Freq: Four times a day (QID) | INTRAMUSCULAR | Status: DC | PRN
  Filled 2018-11-28: qty 2

## 2018-11-28 MED ORDER — METRONIDAZOLE 500 MG PO TABS
500.0000 mg | ORAL_TABLET | Freq: Three times a day (TID) | ORAL | Status: DC
  Administered 2018-11-28 – 2018-11-30 (×5): 500 mg via ORAL
  Filled 2018-11-28 (×5): qty 1

## 2018-11-28 MED ORDER — SODIUM CHLORIDE 0.9 % IV SOLN
2.0000 g | Freq: Three times a day (TID) | INTRAVENOUS | Status: DC
  Administered 2018-11-28 – 2018-11-30 (×5): 2 g via INTRAVENOUS
  Filled 2018-11-28 (×5): qty 2

## 2018-11-28 MED ORDER — ONDANSETRON HCL 4 MG PO TABS: 4.0000 mg | ORAL_TABLET | Freq: Four times a day (QID) | ORAL | Status: DC | PRN

## 2018-11-28 MED ORDER — MUPIROCIN 2 % EX OINT
1.0000 "application " | TOPICAL_OINTMENT | Freq: Two times a day (BID) | CUTANEOUS | Status: AC
  Administered 2018-11-28 – 2018-12-03 (×10): 1 via NASAL
  Filled 2018-11-28 (×4): qty 22

## 2018-11-28 MED ORDER — SODIUM CHLORIDE 0.9 % IV SOLN
INTRAVENOUS | Status: AC
  Administered 2018-11-28 – 2018-11-29 (×2): via INTRAVENOUS
  Administered 2018-11-29: 1000 mL via INTRAVENOUS

## 2018-11-28 MED ORDER — INSULIN ASPART 100 UNIT/ML ~~LOC~~ SOLN
0.0000 [IU] | Freq: Three times a day (TID) | SUBCUTANEOUS | Status: DC
  Administered 2018-11-29: 3 [IU] via SUBCUTANEOUS
  Administered 2018-11-29 (×2): 4 [IU] via SUBCUTANEOUS

## 2018-11-28 MED ORDER — VANCOMYCIN HCL 10 G IV SOLR
1250.0000 mg | INTRAVENOUS | Status: DC
  Filled 2018-11-28: qty 1250

## 2018-11-28 MED ORDER — OXYCODONE HCL 5 MG PO TABS: 5.0000 mg | ORAL_TABLET | ORAL | Status: DC | PRN

## 2018-11-28 MED ORDER — LACTATED RINGERS IV SOLN: INTRAVENOUS | Status: DC

## 2018-11-28 NOTE — Progress Notes (Addendum)
Pharmacy Antibiotic Note:  Vancomycin, Cefepime  Yolanda [name] is a 43 y.o. female admitted on 11/28/2018 with RLE diabetic foot infection. Pt is a transfer from [facility]. Per notes sent with pt, she rec'd vancomycin 200 ml IV (?dose) at OSH at 14:21 today and cefepime 1 gram IV at OSH at 05:15 today.  Pharmacy has been consulted for vancomycin and cefepime dosing.  CT at [facility] showed remarkable destruction of bone (hindfoot, midfoot, ~all metatarsal bases), with gas in tissue. Blood cx drawn at [facility] are pending. Scr at OSH this AM was 1.19.  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  Per MD note, she failed daptomycin and was transitioned to vancomycin (which was supposed to complete on 11/20/2018).  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 Per pt report, she had Rx for ciprofloxacin 500 mg po BID X 10 days, starting on 11/21/18.  Medical history also includes: anemia, hx DVT, HTN, hypercalcemia.  Plan: As vancomycin dose pt rec'd at [facility] is not known, checked stat vancomycin level (random vanc level drawn at ~1900 this evening was 10 mg/L). Scr here is 0.89; CrCl 77 ml/min. Will start vancomycin 1750 mg IV Q 24 hour (AUC estimated at 486.9; goal AUC 400-550). Initiate cefepime 2 gm IV Q 8 hrs.   Height: 5\' 6"  (167.6 cm) Weight: 160 lb 15 oz (73 kg) IBW/kg (Calculated) : 59.3  Temp (24hrs), Avg:98 F (36.7 C), Min:98 F (36.7 C), Max:98 F (36.7 C)   CrCl cannot be calculated (Patient's most recent lab result is older than the maximum 21 days allowed.).  Scr this evening: 0.89; CrCl 77 mL/min.  No Known Allergies  Antimicrobials this admission: 05/20 cefepime >> 05/20 vancomycin >> 05/20 metronidazole PO >>   Microbiology results: 05/20 BCx from [facility]: pending 05/20 BCx from [facility]:  pending 05/20: MRSA nasal swab positive 05/20: COVID negative  Thank you for allowing pharmacy to be a part of this patient's care.  Vicki Mallet, PharmD, BCPS, [facility] Clinical Pharmacist 11/28/2018 6:58 PM

## 2018-11-28 NOTE — H&P (Addendum)
History and Physical    Nea Gittens HQI:[id number] DOB: 1976-01-16 DOA: 11/28/2018  PCP: System, Pcp Not In  Patient coming from: [place] have personally briefly reviewed patient's old medical records in [place]  Chief Complaint: R foot swelling  HPI: Lynnex Fulp is a 43 y.o. female with medical history significant of T2DM, diabetic foot ulcer, osteomyelitis and cellulitis with MRSA bacteremia, presumed endocarditis presenting with worsening right lower extremity pain and swelling.  She notes complain for worsening pain and swelling of her R foot over the past week.  Notes subjective fever.  Denies cough, cold, CP.  Denies sick contacts, travel, and contact with pt with COVID 19.  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  She failed daptomycin and was transitioned to vancomycin (which was supposed to complete on 11/20/2018).  She was found to have osteomyelitis of the R foot and ankle at that time with cellulitis and charcot foot as well with plan for antibiotic treatment at that time.  The patient is alert and oriented, but is limited in her ability to tell much more regarding her history.  She was unclear about some of her medications and the details of her presentation.  She thinks her last dose of xarelto was 5/18.   ED Course: She was seen at [facility] and was found to have CT with findings concerning for destruction of the entire mid and hindfoot with fluid and gas collections and surrounding cellulitis (see below).  Lactate was 5.7 on presentation, improved to 1.7.  Initially tachy to the 130's with SBP in the 80's.  Vitals improved with IVF.  She was transfused pRBCs.  Transferred to [facility] due to need for orthopedic consultation with Dr. Sharol Given.  Review of Systems: Negative except as noted per HPI  Past Medical History:  Diagnosis Date  . MRSA infection within last 3 months   . Type 2 diabetes mellitus treated with  insulin [facility])     Past Surgical History:  Procedure Laterality Date  . IRRIGATION AND DEBRIDEMENT KNEE Left      reports that she has been smoking cigarettes. She has smoked for the past 3.00 years. She has never used smokeless tobacco. She reports previous alcohol use. She reports that she does not use drugs.  No Known Allergies  No family history on file. Uncle with diabetes Mother HTN  Prior to Admission medications   Medication Sig Start Date End Date Taking? Authorizing Provider  acetaminophen (TYLENOL) 325 MG tablet Take 2 tablets (650 mg total) by mouth every 6 (six) hours as needed for mild pain, fever or headache (or Fever >/= 101). 10/15/18  Yes Emokpae, Courage, MD  Amino Acids-Protein Hydrolys (FEEDING SUPPLEMENT, PRO-STAT SUGAR FREE 64,) LIQD Take 30 mLs by mouth 2 (two) times daily. 10/15/18  Yes Emokpae, Courage, MD  Insulin Degludec (TRESIBA FLEXTOUCH) 200 UNIT/ML SOPN Inject 70 Units into the skin 2 (two) times a day.   Yes [provider]  insulin lispro (HUMALOG KWIKPEN) 100 UNIT/ML KwikPen Inject 30 Units into the skin 2 (two) times a day.   Yes [provider]  rivaroxaban (XARELTO) 20 MG TABS tablet Take 20 mg by mouth daily with supper.   Yes [provider]  amLODipine (NORVASC) 5 MG tablet Take 1 tablet (5 mg total) by mouth daily. Patient not taking: Reported on 11/28/2018 10/16/18   Roxan Hockey, MD  ciprofloxacin (CIPRO) 500 MG tablet Take 500 mg by mouth 2 (  two) times daily. for 10 days 11/21/18   [provider]  hydrOXYzine (ATARAX/VISTARIL) 25 MG tablet Take 1 tablet (25 mg total) by mouth at bedtime. Patient not taking: Reported on 11/28/2018 10/15/18   Roxan Hockey, MD  hydrOXYzine (ATARAX/VISTARIL) 25 MG tablet Take 1 tablet (25 mg total) by mouth 2 (two) times daily as needed for anxiety, itching or nausea. Patient not taking: Reported on 11/28/2018 10/15/18   Roxan Hockey, MD  insulin aspart (NOVOLOG) 100 UNIT/ML  injection Inject 3 Units into the skin 3 (three) times daily with meals. Patient not taking: Reported on 11/28/2018 10/15/18   Roxan Hockey, MD  loperamide (IMODIUM) 2 MG capsule Take 1 capsule (2 mg total) by mouth every 6 (six) hours as needed for diarrhea or loose stools. Patient not taking: Reported on 11/28/2018 10/15/18   Roxan Hockey, MD  metoprolol tartrate (LOPRESSOR) 25 MG tablet Take 1 tablet (25 mg total) by mouth 2 (two) times daily. Patient not taking: Reported on 11/28/2018 10/15/18   Roxan Hockey, MD  Multiple Vitamin (MULTIVITAMIN WITH MINERALS) TABS tablet Take 1 tablet by mouth daily. 10/16/18   Roxan Hockey, MD  Potassium Chloride ER 20 MEQ TBCR Take 20 mEq by mouth daily for 5 days. 1 tab daily by mouth Patient not taking: Reported on 11/28/2018 10/15/18 10/20/18  Roxan Hockey, MD  traMADol (ULTRAM) 50 MG tablet Take 1 tablet (50 mg total) by mouth every 8 (eight) hours as needed for moderate pain or severe pain. Patient not taking: Reported on 11/28/2018 10/08/18   Rai, Vernelle Emerald, MD  vitamin C (ASCORBIC ACID) 500 MG tablet Take 1 tablet (500 mg total) by mouth daily. 10/08/18   Rai, Ripudeep K, MD  zinc sulfate 220 (50 Zn) MG capsule Take 1 capsule (220 mg total) by mouth daily. 10/08/18   Mendel Corning, MD    Physical Exam: Vitals:   11/28/18 1746  BP: 120/82  Pulse: (!) 104  Resp: (!) 21  Temp: 98 F (36.7 C)  TempSrc: Oral  SpO2: 99%  Weight: 73 kg  Height: _0  (1.676 m)    Constitutional: NAD, calm, comfortable Vitals:   11/28/18 1746  BP: 120/82  Pulse: (!) 104  Resp: (!) 21  Temp: 98 F (36.7 C)  TempSrc: Oral  SpO2: 99%  Weight: 73 kg  Height: _1  (1.676 m)   Eyes: PERRL, lids and conjunctivae normal ENMT: Mucous membranes are moist. Posterior pharynx clear of any exudate or lesions.Normal dentition.  Neck: normal, supple, no masses, no thyromegaly Respiratory:  normal resp effort Cardiovascular: Regular rate and rhythm.  Abdomen:  no tenderness, no masses palpated.  Musculoskeletal: RLE edema with redness to foot/ankle.  Ulcer to lateral ankle.   Skin: redness to RLE as noted above Neurologic: CN 2-12 grossly intact. Sensation intact. Moving all extremities.  Psychiatric: Normal judgment and insight. Alert and oriented x 3. Normal mood.   Labs on Admission: I have personally reviewed following labs and imaging studies  CBC: No results for input(s): WBC, NEUTROABS, HGB, HCT, MCV, PLT in the last 168 hours. Basic Metabolic Panel: No results for input(s): NA, K, CL, CO2, GLUCOSE, BUN, CREATININE, CALCIUM, MG, PHOS in the last 168 hours. GFR: CrCl cannot be calculated (Patient's most recent lab result is older than the maximum 21 days allowed.). Liver Function Tests: No results for input(s): AST, ALT, ALKPHOS, BILITOT, PROT, ALBUMIN in the last 168 hours. No results for input(s): LIPASE, AMYLASE in the last 168 hours. No results  for input(s): AMMONIA in the last 168 hours. Coagulation Profile: No results for input(s): INR, PROTIME in the last 168 hours. Cardiac Enzymes: No results for input(s): CKTOTAL, CKMB, CKMBINDEX, TROPONINI in the last 168 hours. BNP (last 3 results) No results for input(s): PROBNP in the last 8760 hours. HbA1C: No results for input(s): HGBA1C in the last 72 hours. CBG: No results for input(s): GLUCAP in the last 168 hours. Lipid Profile: No results for input(s): CHOL, HDL, LDLCALC, TRIG, CHOLHDL, LDLDIRECT in the last 72 hours. Thyroid Function Tests: No results for input(s): TSH, T4TOTAL, FREET4, T3FREE, THYROIDAB in the last 72 hours. Anemia Panel: No results for input(s): VITAMINB12, FOLATE, FERRITIN, TIBC, IRON, RETICCTPCT in the last 72 hours. Urine analysis: No results found for: COLORURINE, APPEARANCEUR, LABSPEC, PHURINE, GLUCOSEU, HGBUR, BILIRUBINUR, KETONESUR, PROTEINUR, UROBILINOGEN, NITRITE, LEUKOCYTESUR  Radiological Exams on Admission: No results found.  EKG:  Independently reviewed. pending  Assessment/Plan Active Problems:   Right foot infection  Sepsis 2/2 Diabetic Foot Infection:  Of note, recent MRSA bacteremia and presumed endocarditis at last hospitalization Impressive CT findings from [facility] noted below Blood cultures collected at [facility], follow results Follow repeat blood cx here Lactate improving, follow here, continue IVF Vanc/cefepime/flagyl Follow UA WBC 21.3 ABI's ESR, CRP Discussed with [place] ortho, Dr. Sharol Given to see tomorrw Last dose of xarelto per pt was 5/18 NPO at midnight  CT scan from [facility]: "Remarkable destruction of bone involving the hidfoot and midfoot involving essentially all of the metatarsal bases.  Midfoot alignment is not able to assess due to degree of demineralization.  The is dislocation at both Chopart's joints and at the ankle.  Gas is seen within the medullary spaces, most clearly in the distal tibia.  Bone fragments are widespread, with no history of trauma.  There is multispatial gas and fluid collection about the destroyed joints, with absecess reaching 12 cm above the tibial plafond in the flexor compartment and lateral compartment.  Generalized cellulitis.  In the lower leg there is 3 vessel runoff, more diffcult to assess at the level of the foot due to calcification.   Type 2 Diabetes: A1c 7.9.  There's some confusion with her meds.  She told me she was taking 50 units of glargine BID with 70 units of something else as well (she told me she was taking these together).  Per pharmacy med rec, she was on 70 units of tresiba BID.   Continue to monitor on reduced dose lantus at this time (30 units BID) High dose SSI Follow add mealtime and adjust basal as needed  Hypertension: pt denies taking any antihypertensives, but was d/c'd on metoprolol and amlodipine at last hospitalization Continue to hold antihypertensives for now with sepsis  Right Lower Extremity DVT: dx 07/2018,  per last d/c summary in 10/15/2018, xarelto was discontinued at discharge as repeat US was negative for DVT and pt with hx of bleeding issues with debridement. Continue to hold at this time  Hypercalcemia: corrected 12.6.  Follow after IVF.  Follow PTH in AM.  Elevated Alk Phos: likely related to infection, destruction of bone above  Anemia: Hb at OSH 7.4, now s/p transfusion of pRBC and 9.5.  Continue to monitor closely.  Some drainage from wound, but no evidence of significant abla  ?confusion:  Of note, pt seemed confused about many of her medications.  She was A&Ox3, but had difficulty reporting her medications and her history.  Discussed with her father who noted she sometimes is confused  and he attributed this to her DM.  At this point in time, will continue to monitor closely.  Close attention and education will be needed with medications throughout hospitalization and when time for d/c. Delirium precautions  Negative COVID 19 testing  DVT prophylaxis: SCD  Code Status: full, discussed with pt Family Communication: discussed with father Disposition Plan: pending ortho eval and likely surgery, inpatient due to severe sepsis at presentation to outside ED and need for orthopedic evaluation and intervention Consults called: ortho Admission status: inpatient given above    Fayrene Helper MD [facility] Pager AMION  If 7PM-7AM, please contact night-coverage [email or web address] Password [facility]  11/28/2018, 7:28 PM

## 2018-11-28 NOTE — Progress Notes (Signed)
Patient arrived on the unit via [facility] to 5W20;; alert and oriented x 4; pain noted on right foot with moving and touching; IV saline locked in right wrist; skin dry, with wound on right foot. Orient patient to room and unit; gave patient care guide; instructed how to use the call bell and  fall risk precautions. Will continue to monitor the patient.

## 2018-11-28 NOTE — Progress Notes (Signed)
CRITICAL VALUE ALERT  Critical Value:  MRSA PCR - positive  Date & Time Notied:  11/28/2018; 19:30  Provider Notified: Dr. Lowell Guitar  Orders Received/Actions taken: N/A

## 2018-11-28 NOTE — Progress Notes (Signed)
Low suspicious of COVID, he is just doing our routine screen for new admission. Test collected and sent.

## 2018-11-29 ENCOUNTER — Other Ambulatory Visit: Payer: Self-pay

## 2018-11-29 ENCOUNTER — Inpatient Hospital Stay (HOSPITAL_COMMUNITY): Payer: Medicaid Other

## 2018-11-29 ENCOUNTER — Ambulatory Visit: Payer: 59 | Admitting: Orthopedic Surgery

## 2018-11-29 DIAGNOSIS — L089 Local infection of the skin and subcutaneous tissue, unspecified: Secondary | ICD-10-CM

## 2018-11-29 DIAGNOSIS — E43 Unspecified severe protein-calorie malnutrition: Secondary | ICD-10-CM

## 2018-11-29 DIAGNOSIS — M86271 Subacute osteomyelitis, right ankle and foot: Secondary | ICD-10-CM | POA: Diagnosis present

## 2018-11-29 DIAGNOSIS — E1142 Type 2 diabetes mellitus with diabetic polyneuropathy: Secondary | ICD-10-CM

## 2018-11-29 DIAGNOSIS — E118 Type 2 diabetes mellitus with unspecified complications: Secondary | ICD-10-CM

## 2018-11-29 DIAGNOSIS — L02611 Cutaneous abscess of right foot: Secondary | ICD-10-CM | POA: Diagnosis present

## 2018-11-29 DIAGNOSIS — E1165 Type 2 diabetes mellitus with hyperglycemia: Secondary | ICD-10-CM

## 2018-11-29 DIAGNOSIS — E11621 Type 2 diabetes mellitus with foot ulcer: Secondary | ICD-10-CM

## 2018-11-29 LAB — GLUCOSE, CAPILLARY
Glucose-Capillary: 163 mg/dL — ABNORMAL HIGH (ref 70–99)
Glucose-Capillary: 167 mg/dL — ABNORMAL HIGH (ref 70–99)
Glucose-Capillary: 136 mg/dL — ABNORMAL HIGH (ref 70–99)
Glucose-Capillary: 70 mg/dL (ref 70–99)
Glucose-Capillary: 106 mg/dL — ABNORMAL HIGH (ref 70–99)

## 2018-11-29 LAB — COMPREHENSIVE METABOLIC PANEL
ALT: 20 U/L (ref 0–44)
AST: 32 U/L (ref 15–41)
Albumin: 1.2 g/dL — ABNORMAL LOW (ref 3.5–5.0)
Alkaline Phosphatase: 289 U/L — ABNORMAL HIGH (ref 38–126)
Anion gap: 6 (ref 5–15)
BUN: 17 mg/dL (ref 6–20)
CO2: 23 mmol/L (ref 22–32)
Calcium: 9.7 mg/dL (ref 8.9–10.3)
Chloride: 106 mmol/L (ref 98–111)
Creatinine, Ser: 0.97 mg/dL (ref 0.44–1.00)
GFR calc Af Amer: >60 mL/min (ref >60)
GFR calc non Af Amer: >60 mL/min (ref >60)
Glucose, Bld: 247 mg/dL — ABNORMAL HIGH (ref 70–99)
Potassium: 4 mmol/L (ref 3.5–5.1)
Sodium: 135 mmol/L (ref 135–145)
Total Bilirubin: 3.3 mg/dL — ABNORMAL HIGH (ref 0.3–1.2)
Total Protein: 7.5 g/dL (ref 6.5–8.1)

## 2018-11-29 LAB — URINALYSIS, ROUTINE W REFLEX MICROSCOPIC
Glucose, UA: NEGATIVE mg/dL
Ketones, ur: NEGATIVE mg/dL
Leukocytes,Ua: NEGATIVE
Nitrite: NEGATIVE
Protein, ur: NEGATIVE mg/dL
Specific Gravity, Urine: 1.01 (ref 1.005–1.030)
pH: 5.5 (ref 5.0–8.0)

## 2018-11-29 LAB — BLOOD CULTURE ID PANEL (REFLEXED)

## 2018-11-29 LAB — CBC
HCT: 26.4 % — ABNORMAL LOW (ref 36.0–46.0)
Hemoglobin: 8.9 g/dL — ABNORMAL LOW (ref 12.0–15.0)
MCH: 30.5 pg (ref 26.0–34.0)
MCHC: 33.7 g/dL (ref 30.0–36.0)
MCV: 90.4 fL (ref 80.0–100.0)
Platelets: 363 10*3/uL (ref 150–400)
RBC: 2.92 MIL/uL — ABNORMAL LOW (ref 3.87–5.11)
RDW: 13.1 % (ref 11.5–15.5)
WBC: 20.2 10*3/uL — ABNORMAL HIGH (ref 4.0–10.5)
nRBC: 0.2 % (ref 0.0–0.2)

## 2018-11-29 LAB — URINALYSIS, MICROSCOPIC (REFLEX)

## 2018-11-29 MED ORDER — SODIUM CHLORIDE 0.9 % IV SOLN
INTRAVENOUS | Status: DC | PRN
  Administered 2018-11-30: 250 mL via INTRAVENOUS

## 2018-11-29 MED ORDER — TAMSULOSIN HCL 0.4 MG PO CAPS
0.4000 mg | ORAL_CAPSULE | Freq: Every day | ORAL | Status: AC
  Administered 2018-11-29 – 2018-12-05 (×7): 0.4 mg via ORAL
  Filled 2018-11-29 (×7): qty 1

## 2018-11-29 MED ORDER — ADULT MULTIVITAMIN W/MINERALS CH
1.0000 | ORAL_TABLET | Freq: Every day | ORAL | Status: DC
  Administered 2018-11-29 – 2018-12-25 (×26): 1 via ORAL
  Filled 2018-11-29 (×27): qty 1

## 2018-11-29 MED ORDER — PRO-STAT SUGAR FREE PO LIQD
30.0000 mL | Freq: Two times a day (BID) | ORAL | Status: DC
  Administered 2018-11-29: 30 mL via ORAL
  Filled 2018-11-29: qty 30

## 2018-11-29 MED ORDER — GLUCERNA SHAKE PO LIQD
237.0000 mL | Freq: Two times a day (BID) | ORAL | Status: DC
  Administered 2018-11-29: 237 mL via ORAL

## 2018-11-29 MED ORDER — INSULIN ASPART 100 UNIT/ML ~~LOC~~ SOLN
5.0000 [IU] | Freq: Three times a day (TID) | SUBCUTANEOUS | Status: DC
  Administered 2018-11-29 – 2018-12-01 (×5): 5 [IU] via SUBCUTANEOUS

## 2018-11-29 MED ORDER — ENSURE MAX PROTEIN PO LIQD
11.0000 [oz_av] | Freq: Every day | ORAL | Status: DC
  Administered 2018-12-01 – 2018-12-24 (×24): 11 [oz_av] via ORAL
  Filled 2018-11-29 (×27): qty 330

## 2018-11-29 MED ORDER — SODIUM CHLORIDE 0.9 % IV BOLUS
1000.0000 mL | Freq: Once | INTRAVENOUS | Status: AC
  Administered 2018-11-29: 1000 mL via INTRAVENOUS

## 2018-11-29 MED ORDER — ENSURE ENLIVE PO LIQD: 237.0000 mL | Freq: Two times a day (BID) | ORAL | Status: DC

## 2018-11-29 NOTE — Consult Note (Signed)
         [facility]    Date of Admission:  11/28/2018   Day 1 vancomycin, cefepime and metronidazole         Ms. 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  She was diagnosed with MRSA bacteremia and possible endocarditis in March and was treated with vancomycin.  The plan was for 6 weeks ending on 11/20/2018.  She missed her follow-up visit in our clinic.  She returns now with persistent foot infection and recurrent bacteremia.  I agree with current antibiotic therapy.  I will follow-up and provide a formal consult in the morning.         Cliffton Asters, MD [facility] for Infectious Disease [facility] [phone] pager   [phone] cell 11/29/2018, 4:55 PM

## 2018-11-29 NOTE — Progress Notes (Signed)
Patient complaining of being unable to urinate. Bladder scan showed 415 ml retention. MD notified. Will continue to monitor.

## 2018-11-29 NOTE — Consult Note (Signed)
ORTHOPAEDIC CONSULTATION  REQUESTING PHYSICIAN: Cathren Harshai, Ripudeep K, MD  Chief Complaint: Abscess ulceration dorsal lateral aspect right foot.  HPI: Yolanda [name] is a 43 y.o. female who presents with abscess osteomyelitis and sepsis with an infected Charcot right foot.  Patient has had multiple medical problems including uncontrolled type 2 diabetes and severe protein caloric malnutrition.  She has been treated in the office for wound care for a large ulcer over the lateral aspect of her right leg.  The ulcer has completely healed with wearing a medical compression stocking.  Past Medical History:  Diagnosis Date  . MRSA infection within last 3 months   . Type 2 diabetes mellitus treated with insulin [facility](HCC)    Past Surgical History:  Procedure Laterality Date  . IRRIGATION AND DEBRIDEMENT KNEE Left    Social History   Socioeconomic History  . Marital status: Unknown    Spouse name: Not on file  . Number of children: Not on file  . Years of education: Not on file  . Highest education level: Not on file  Occupational History  . Not on file  Social Needs  . Financial resource strain: Not on file  . Food insecurity:    Worry: Not on file    Inability: Not on file  . Transportation needs:    Medical: Not on file    Non-medical: Not on file  Tobacco Use  . Smoking status: Current Some Day Smoker    Years: 3.00    Types: Cigarettes  . Smokeless tobacco: Never Used  Substance and Sexual Activity  . Alcohol use: Not Currently  . Drug use: Never  . Sexual activity: Not on file  Lifestyle  . Physical activity:    Days per week: Not on file    Minutes per session: Not on file  . Stress: Not on file  Relationships  . Social connections:    Talks on phone: Not on file    Gets together: Not on file    Attends religious service: Not on file    Active member of club or organization: Not on file    Attends meetings of clubs or organizations: Not on file    Relationship  status: Not on file  Other Topics Concern  . Not on file  Social History Narrative  . Not on file   No family history on file. - negative except otherwise stated in the family history section No Known Allergies Prior to Admission medications   Medication Sig Start Date End Date Taking? Authorizing Provider  acetaminophen (TYLENOL) 325 MG tablet Take 2 tablets (650 mg total) by mouth every 6 (six) hours as needed for mild pain, fever or headache (or Fever >/= 101). 10/15/18  Yes Emokpae, Courage, MD  Amino Acids-Protein Hydrolys (FEEDING SUPPLEMENT, PRO-STAT SUGAR FREE 64,) LIQD Take 30 mLs by mouth 2 (two) times daily. 10/15/18  Yes Emokpae, Courage, MD  Insulin Degludec (TRESIBA FLEXTOUCH) 200 UNIT/ML SOPN Inject 70 Units into the skin 2 (two) times a day.   Yes [provider]  insulin lispro (HUMALOG KWIKPEN) 100 UNIT/ML KwikPen Inject 30 Units into the skin 2 (two) times a day.   Yes [provider]  rivaroxaban (XARELTO) 20 MG TABS tablet Take 20 mg by mouth daily with supper.   Yes [provider]  amLODipine (NORVASC) 5 MG tablet Take 1 tablet (5 mg total) by mouth daily. Patient not taking: Reported on 11/28/2018 10/16/18   Shon HaleEmokpae, Courage, MD  ciprofloxacin (CIPRO)  500 MG tablet Take 500 mg by mouth 2 (two) times daily. for 10 days 11/21/18   [provider]  hydrOXYzine (ATARAX/VISTARIL) 25 MG tablet Take 1 tablet (25 mg total) by mouth at bedtime. Patient not taking: Reported on 11/28/2018 10/15/18   Shon Hale, MD  hydrOXYzine (ATARAX/VISTARIL) 25 MG tablet Take 1 tablet (25 mg total) by mouth 2 (two) times daily as needed for anxiety, itching or nausea. Patient not taking: Reported on 11/28/2018 10/15/18   Shon Hale, MD  insulin aspart (NOVOLOG) 100 UNIT/ML injection Inject 3 Units into the skin 3 (three) times daily with meals. Patient not taking: Reported on 11/28/2018 10/15/18   Shon Hale, MD  loperamide (IMODIUM) 2 MG capsule Take 1  capsule (2 mg total) by mouth every 6 (six) hours as needed for diarrhea or loose stools. Patient not taking: Reported on 11/28/2018 10/15/18   Shon Hale, MD  metoprolol tartrate (LOPRESSOR) 25 MG tablet Take 1 tablet (25 mg total) by mouth 2 (two) times daily. Patient not taking: Reported on 11/28/2018 10/15/18   Shon Hale, MD  Multiple Vitamin (MULTIVITAMIN WITH MINERALS) TABS tablet Take 1 tablet by mouth daily. 10/16/18   Shon Hale, MD  Potassium Chloride ER 20 MEQ TBCR Take 20 mEq by mouth daily for 5 days. 1 tab daily by mouth Patient not taking: Reported on 11/28/2018 10/15/18 10/20/18  Shon Hale, MD  traMADol (ULTRAM) 50 MG tablet Take 1 tablet (50 mg total) by mouth every 8 (eight) hours as needed for moderate pain or severe pain. Patient not taking: Reported on 11/28/2018 10/08/18   Rai, Delene Ruffini, MD  vitamin C (ASCORBIC ACID) 500 MG tablet Take 1 tablet (500 mg total) by mouth daily. 10/08/18   Rai, Ripudeep K, MD  zinc sulfate 220 (50 Zn) MG capsule Take 1 capsule (220 mg total) by mouth daily. 10/08/18   Rai, Delene Ruffini, MD   No results found. - pertinent xrays, CT, MRI studies were reviewed and independently interpreted  Positive ROS: All other systems have been reviewed and were otherwise negative with the exception of those mentioned in the HPI and as above.  Physical Exam: General: Alert, no acute distress Psychiatric: Patient is competent for consent with normal mood and affect Lymphatic: No axillary or cervical lymphadenopathy Cardiovascular: No pedal edema Respiratory: No cyanosis, no use of accessory musculature GI: No organomegaly, abdomen is soft and non-tender    Images:  @  Labs:  Lab Results  Component Value Date   HGBA1C 7.9 (H) 11/28/2018   HGBA1C 11.0 (H) 10/07/2018   ESRSEDRATE 136 (H) 11/28/2018   CRP 22.1 (H) 11/28/2018   REPTSTATUS 10/14/2018 FINAL 10/09/2018   CULT  10/09/2018    NO GROWTH 5 DAYS Performed at [facility], [address], [place], [place] [address]    LABORGA METHICILLIN RESISTANT STAPHYLOCOCCUS AUREUS 10/07/2018    Lab Results  Component Value Date   ALBUMIN 1.2 (L) 11/29/2018   ALBUMIN 1.4 (L) 11/28/2018   ALBUMIN 2.4 (L) 10/13/2018    Neurologic: Patient does not have protective sensation bilateral lower extremities.   MUSCULOSKELETAL:   Skin: Examination the wound laterally over the fibula is completely healed.  There is no redness no cellulitis no tenderness to palpation of the calf.  Examination of the foot she has a good dorsalis pedis pulse in both feet no ulcers in the left lower extremity.  Right foot she has an ulcer dorsal lateral aspect over the area of Charcot collapse  of the talus.  The wound is probed there is purulent drainage the ulcer extends all the way down to the talus and midfoot with a large abscess most likely developing from the Charcot collapse.  Patient has no arterial insufficiency her protein caloric malnutrition is worse with an albumin of 1.2 hemoglobin A1c is still elevated and this is ranged from 11-7.9.  Assessment: Assessment: Uncontrolled type 2 diabetes with Charcot collapse of the right foot with osteomyelitis abscess and sepsis with severe protein caloric malnutrition.  Plan: Plan: Discussed with the patient her only option is a transtibial amputation.  I feel she should heal this well with her good arterial inflow.  Plan for right BKA tomorrow Friday.  Patient needs aggressive management of her diabetes and protein caloric malnutrition she is at high risk for complications.  Thank you for the consult and the opportunity to see [name], MD [facility] [phone] 8:38 AM

## 2018-11-29 NOTE — H&P (View-Only) (Signed)
ORTHOPAEDIC CONSULTATION  REQUESTING PHYSICIAN: Cathren Harshai, Ripudeep K, MD  Chief Complaint: Abscess ulceration dorsal lateral aspect right foot.  HPI: Yolanda [name] is a 43 y.o. female who presents with abscess osteomyelitis and sepsis with an infected Charcot right foot.  Patient has had multiple medical problems including uncontrolled type 2 diabetes and severe protein caloric malnutrition.  She has been treated in the office for wound care for a large ulcer over the lateral aspect of her right leg.  The ulcer has completely healed with wearing a medical compression stocking.  Past Medical History:  Diagnosis Date  . MRSA infection within last 3 months   . Type 2 diabetes mellitus treated with insulin [facility](HCC)    Past Surgical History:  Procedure Laterality Date  . IRRIGATION AND DEBRIDEMENT KNEE Left    Social History   Socioeconomic History  . Marital status: Unknown    Spouse name: Not on file  . Number of children: Not on file  . Years of education: Not on file  . Highest education level: Not on file  Occupational History  . Not on file  Social Needs  . Financial resource strain: Not on file  . Food insecurity:    Worry: Not on file    Inability: Not on file  . Transportation needs:    Medical: Not on file    Non-medical: Not on file  Tobacco Use  . Smoking status: Current Some Day Smoker    Years: 3.00    Types: Cigarettes  . Smokeless tobacco: Never Used  Substance and Sexual Activity  . Alcohol use: Not Currently  . Drug use: Never  . Sexual activity: Not on file  Lifestyle  . Physical activity:    Days per week: Not on file    Minutes per session: Not on file  . Stress: Not on file  Relationships  . Social connections:    Talks on phone: Not on file    Gets together: Not on file    Attends religious service: Not on file    Active member of club or organization: Not on file    Attends meetings of clubs or organizations: Not on file    Relationship  status: Not on file  Other Topics Concern  . Not on file  Social History Narrative  . Not on file   No family history on file. - negative except otherwise stated in the family history section No Known Allergies Prior to Admission medications   Medication Sig Start Date End Date Taking? Authorizing Provider  acetaminophen (TYLENOL) 325 MG tablet Take 2 tablets (650 mg total) by mouth every 6 (six) hours as needed for mild pain, fever or headache (or Fever >/= 101). 10/15/18  Yes Emokpae, Courage, MD  Amino Acids-Protein Hydrolys (FEEDING SUPPLEMENT, PRO-STAT SUGAR FREE 64,) LIQD Take 30 mLs by mouth 2 (two) times daily. 10/15/18  Yes Emokpae, Courage, MD  Insulin Degludec (TRESIBA FLEXTOUCH) 200 UNIT/ML SOPN Inject 70 Units into the skin 2 (two) times a day.   Yes [provider]  insulin lispro (HUMALOG KWIKPEN) 100 UNIT/ML KwikPen Inject 30 Units into the skin 2 (two) times a day.   Yes [provider]  rivaroxaban (XARELTO) 20 MG TABS tablet Take 20 mg by mouth daily with supper.   Yes [provider]  amLODipine (NORVASC) 5 MG tablet Take 1 tablet (5 mg total) by mouth daily. Patient not taking: Reported on 11/28/2018 10/16/18   Shon HaleEmokpae, Courage, MD  ciprofloxacin (CIPRO)  500 MG tablet Take 500 mg by mouth 2 (two) times daily. for 10 days 11/21/18   [provider]  hydrOXYzine (ATARAX/VISTARIL) 25 MG tablet Take 1 tablet (25 mg total) by mouth at bedtime. Patient not taking: Reported on 11/28/2018 10/15/18   Emokpae, Courage, MD  hydrOXYzine (ATARAX/VISTARIL) 25 MG tablet Take 1 tablet (25 mg total) by mouth 2 (two) times daily as needed for anxiety, itching or nausea. Patient not taking: Reported on 11/28/2018 10/15/18   Emokpae, Courage, MD  insulin aspart (NOVOLOG) 100 UNIT/ML injection Inject 3 Units into the skin 3 (three) times daily with meals. Patient not taking: Reported on 11/28/2018 10/15/18   Emokpae, Courage, MD  loperamide (IMODIUM) 2 MG capsule Take 1  capsule (2 mg total) by mouth every 6 (six) hours as needed for diarrhea or loose stools. Patient not taking: Reported on 11/28/2018 10/15/18   Emokpae, Courage, MD  metoprolol tartrate (LOPRESSOR) 25 MG tablet Take 1 tablet (25 mg total) by mouth 2 (two) times daily. Patient not taking: Reported on 11/28/2018 10/15/18   Emokpae, Courage, MD  Multiple Vitamin (MULTIVITAMIN WITH MINERALS) TABS tablet Take 1 tablet by mouth daily. 10/16/18   Emokpae, Courage, MD  Potassium Chloride ER 20 MEQ TBCR Take 20 mEq by mouth daily for 5 days. 1 tab daily by mouth Patient not taking: Reported on 11/28/2018 10/15/18 10/20/18  Emokpae, Courage, MD  traMADol (ULTRAM) 50 MG tablet Take 1 tablet (50 mg total) by mouth every 8 (eight) hours as needed for moderate pain or severe pain. Patient not taking: Reported on 11/28/2018 10/08/18   Rai, Ripudeep K, MD  vitamin C (ASCORBIC ACID) 500 MG tablet Take 1 tablet (500 mg total) by mouth daily. 10/08/18   Rai, Ripudeep K, MD  zinc sulfate 220 (50 Zn) MG capsule Take 1 capsule (220 mg total) by mouth daily. 10/08/18   Rai, Ripudeep K, MD   No results found. - pertinent xrays, CT, MRI studies were reviewed and independently interpreted  Positive ROS: All other systems have been reviewed and were otherwise negative with the exception of those mentioned in the HPI and as above.  Physical Exam: General: Alert, no acute distress Psychiatric: Patient is competent for consent with normal mood and affect Lymphatic: No axillary or cervical lymphadenopathy Cardiovascular: No pedal edema Respiratory: No cyanosis, no use of accessory musculature GI: No organomegaly, abdomen is soft and non-tender    Images:  @ENCIMAGES@  Labs:  Lab Results  Component Value Date   HGBA1C 7.9 (H) 11/28/2018   HGBA1C 11.0 (H) 10/07/2018   ESRSEDRATE 136 (H) 11/28/2018   CRP 22.1 (H) 11/28/2018   REPTSTATUS 10/14/2018 FINAL 10/09/2018   CULT  10/09/2018    NO GROWTH 5 DAYS Performed at [facility], [address] [place],  [address]    LABORGA METHICILLIN RESISTANT STAPHYLOCOCCUS AUREUS 10/07/2018    Lab Results  Component Value Date   ALBUMIN 1.2 (L) 11/29/2018   ALBUMIN 1.4 (L) 11/28/2018   ALBUMIN 2.4 (L) 10/13/2018    Neurologic: Patient does not have protective sensation bilateral lower extremities.   MUSCULOSKELETAL:   Skin: Examination the wound laterally over the fibula is completely healed.  There is no redness no cellulitis no tenderness to palpation of the calf.  Examination of the foot she has a good dorsalis pedis pulse in both feet no ulcers in the left lower extremity.  Right foot she has an ulcer dorsal lateral aspect over the area of Charcot collapse   of the talus.  The wound is probed there is purulent drainage the ulcer extends all the way down to the talus and midfoot with a large abscess most likely developing from the Charcot collapse.  Patient has no arterial insufficiency her protein caloric malnutrition is worse with an albumin of 1.2 hemoglobin A1c is still elevated and this is ranged from 11-7.9.  Assessment: Assessment: Uncontrolled type 2 diabetes with Charcot collapse of the right foot with osteomyelitis abscess and sepsis with severe protein caloric malnutrition.  Plan: Plan: Discussed with the patient her only option is a transtibial amputation.  I feel she should heal this well with her good arterial inflow.  Plan for right BKA tomorrow Friday.  Patient needs aggressive management of her diabetes and protein caloric malnutrition she is at high risk for complications.  Thank you for the consult and the opportunity to see [name], MD [facility] [phone] 8:38 AM

## 2018-11-29 NOTE — Progress Notes (Signed)
Inpatient Diabetes Program Recommendations  AACE/ADA: New Consensus Statement on Inpatient Glycemic Control (2015)  Target Ranges:  Prepandial:   less than 140 mg/dL      Peak postprandial:   less than 180 mg/dL (1-2 hours)      Critically ill patients:  140 - 180 mg/dL   Lab Results  Component Value Date   GLUCAP 163 (H) 11/29/2018   HGBA1C 7.9 (H) 11/28/2018    Review of Glycemic Control Results for Yolanda [name], Yolanda [name] (MRN [id number]) as of 11/29/2018 11:13  Ref. Range 11/28/2018 21:59 11/29/2018 08:19  Glucose-Capillary Latest Ref Range: 70 - 99 mg/dL 629 (H) 528 (H)   Diabetes history: DM 2 Outpatient Diabetes medications: Humalog 30 units bid, Tresiba 70 units bid Current orders for Inpatient glycemic control:  Novolog resistant tid with meals and HS Novolog 5 units tid with meals Lantus 30 units bid Inpatient Diabetes Program Recommendations:     Note that A1C is improved from last hospitalization (from 11% to 7.9%).  Agree with current orders.  Will discuss with patient.   Thanks,  Beryl Meager, RN, BC-ADM Inpatient Diabetes Coordinator Pager [phone] (8a-5p)

## 2018-11-29 NOTE — Progress Notes (Addendum)
[facility]                                                                              Patient Demographics  Yolanda [name], is a 43 y.o. female, DOB - 08-16-1975, OXB:[id number]  Admit date - 11/28/2018   Admitting Physician Vianne Bulls, MD  Outpatient Primary MD for the patient is System, Pcp Not In  Outpatient specialists:   LOS - 1  days   Medical records reviewed and are as summarized below:    No chief complaint on file.      Brief summary   Patient is a 43 year old female with diabetes type 2, diabetic foot ulcer, osteomyelitis and cellulitis with MRSA bacteremia, presumed endocarditis presented with right lower extremity pain, swelling of her right lower extremity.  She was recently discharged in April for MRSA bacteremia with presumed endocarditis, failed daptomycin and was transitioned to vancomycin (supposed to complete on 11/20/2018).  Patient was seen at [facility] rocking him, CT findings concerning for destruction of the entire mid and hindfoot with fluid and gas collections, surrounding cellulitis.   Assessment & Plan    Principal Problem: Sepsis with right foot infection, osteomyelitis and MRSA bacteremia and presumed endocarditis -Patient met sepsis criteria at the time of admission, initially was tachycardia to heart rate 80s, SBP in 80s, lactate 5.7, source right foot osteomyelitis -Follow blood cultures, continue vancomycin cefepime and Flagyl -Continue IV fluids -Orthopedics consulted, seen by Dr. Sharol Given this morning, plan for right BKA tomorrow   Active Problems:   Diabetes mellitus type 2, uncontrolled, with complications, foot infection ([place]) -Uncontrolled, overnight CBGs in 300s -Continue Lantus 30 units twice daily, added NovoLog meal coverage 5 units 3 times daily AC, continue sliding scale insulin -Diabetic coordinator consulted  Essential hypertension BP currently soft, continue to hold antihypertensives  Right lower extremity  DVT -Diagnosed in 07/2018, used to be on Xarelto, she completed the starter pack however Xarelto was discontinued due to bleeding issues at [facility] in 09/2018.  Right lower extremity Dopplers on 3/29 did not show any DVT hence Xarelto was not resumed.  Hypercalcemia -Likely due to sepsis, dehydration, calcium 10.5 with a time of admission, improved to 9.7  Severe protein calorie malnutrition -Albumin 1.2 -Placed on pro-stat, nutritional supplements  Acute on chronic anemia  -H&H currently stable baseline 9-10 -Hemoglobin 7.4 at OSH, transfused 1 unit packed RBC, H&H currently stable.  Urinary retention Planing of being unable to urinate, bladder scan showed 415 cc. -Obtain UA and culture, Flomax for 7 days -Foley catheter if urinary retention continues   Code Status: Full code DVT Prophylaxis: SCDs, will place on heparin subcu after surgery Family Communication: Discussed in detail with the patient, all imaging results, lab results explained to the patient    Disposition Plan: Plan for BKA on 5/22, remains inpatient  Time Spent in minutes 35 minutes  Procedures:  None  Consultants:   Orthopedics, Dr. Sharol Given  Antimicrobials:   Anti-infectives (From admission, onward)   Start     Dose/Rate Route Frequency Ordered Stop   11/28/18 2045  vancomycin (VANCOCIN) 1,750 mg in sodium chloride 0.9 % 500 mL  IVPB     1,750 mg 250 mL/hr over 120 Minutes Intravenous Every 24 hours 11/28/18 2032     11/28/18 2030  vancomycin (VANCOCIN) 1,250 mg in sodium chloride 0.9 % 250 mL IVPB  Status:  Discontinued     1,250 mg 166.7 mL/hr over 90 Minutes Intravenous Every 24 hours 11/28/18 2004 11/28/18 2032   11/28/18 1900  ceFEPIme (MAXIPIME) 2 g in sodium chloride 0.9 % 100 mL IVPB     2 g 200 mL/hr over 30 Minutes Intravenous Every 8 hours 11/28/18 1848     11/28/18 1815  metroNIDAZOLE (FLAGYL) tablet 500 mg     500 mg Oral Every 8 hours 11/28/18 1803            Medications   Scheduled Meds: . Chlorhexidine Gluconate Cloth  6 each Topical Q0600  . insulin aspart  0-20 Units Subcutaneous TID WC  . insulin aspart  0-5 Units Subcutaneous QHS  . insulin glargine  30 Units Subcutaneous BID  . metroNIDAZOLE  500 mg Oral Q8H  . mupirocin ointment  1 application Nasal BID   Continuous Infusions: . sodium chloride Stopped (11/29/18 7096)  . ceFEPime (MAXIPIME) IV 200 mL/hr at 11/29/18 0700  . vancomycin Stopped (11/29/18 0001)   PRN Meds:.acetaminophen **OR** acetaminophen, ondansetron **OR** ondansetron (ZOFRAN) IV, oxyCODONE, polyethylene glycol      Subjective:   Yolanda [name] was seen and examined today.  No acute complaints, pain controlled in the right lower extremity.  No fevers or chills this morning. Patient denies dizziness, chest pain, shortness of breath, abdominal pain, N/V/D/C.  No acute events overnight.  Objective:   Vitals:   11/29/18 0700 11/29/18 0704 11/29/18 0820 11/29/18 0822  BP:  126/90 (!) 118/106   Pulse:  (!) 108 (!) 107   Resp:  (!) 31 (!) 22   Temp:    97.9 F (36.6 C)  TempSrc:      SpO2:  93% 95%   Weight: 74.5 kg     Height:        Intake/Output Summary (Last 24 hours) at 11/29/2018 1033 Last data filed at 11/29/2018 0857 Gross per 24 hour  Intake 2858.76 ml  Output 1300 ml  Net 1558.76 ml     Wt Readings from Last 3 Encounters:  11/29/18 74.5 kg  11/08/18 77.1 kg  10/24/18 77.1 kg     Exam  General: Alert and oriented x 3, NAD  Eyes:   HEENT:  Atraumatic, normocephalic  Cardiovascular: S1 S2 auscultated, Regular rate and rhythm.  Respiratory: Clear to auscultation bilaterally, no wheezing, rales or rhonchi  Gastrointestinal: Soft, nontender, nondistended, + bowel sounds  Ext: no pedal edema bilaterally  Neuro: No new deficits  Musculoskeletal: No digital cyanosis, clubbing  Skin: Redness with edema to the right lower extremity, ulcer to the lateral ankle  Psych: Normal affect and  demeanor, alert and oriented x3    Data Reviewed:  I have personally reviewed following labs and imaging studies  Micro Results Recent Results (from the past 240 hour(s))  MRSA PCR Screening     Status: Abnormal   Collection Time: 11/28/18  5:30 PM  Result Value Ref Range Status   MRSA by PCR POSITIVE (A) NEGATIVE Final    Comment:        The GeneXpert MRSA Assay (FDA approved for NASAL specimens only), is one component of a comprehensive MRSA colonization surveillance program. It is not intended to diagnose MRSA infection nor to guide or monitor treatment for  MRSA infections. RESULT CALLED TO, READ BACK BY AND VERIFIED WITHJefferson Fuel RN 11/28/18 1928 JDW Performed at [facility], [address] [place], [place] [address]   SARS Coronavirus 2 [facility] order, Performed in [facility] hospital lab)     Status: None   Collection Time: 11/28/18  6:29 PM  Result Value Ref Range Status   SARS Coronavirus 2 NEGATIVE NEGATIVE Final    Comment: (NOTE) If result is NEGATIVE SARS-CoV-2 target nucleic acids are NOT DETECTED. The SARS-CoV-2 RNA is generally detectable in upper and lower  respiratory specimens during the acute phase of infection. The lowest  concentration of SARS-CoV-2 viral copies this assay can detect is 250  copies / mL. A negative result does not preclude SARS-CoV-2 infection  and should not be used as the sole basis for treatment or other  patient management decisions.  A negative result may occur with  improper specimen collection / handling, submission of specimen other  than nasopharyngeal swab, presence of viral mutation(s) within the  areas targeted by this assay, and inadequate number of viral copies  (<250 copies / mL). A negative result must be combined with clinical  observations, patient history, and epidemiological information. If result is POSITIVE SARS-CoV-2 target nucleic acids are DETECTED. The SARS-CoV-2 RNA is generally detectable in upper  and lower  respiratory specimens dur ing the acute phase of infection.  Positive  results are indicative of active infection with SARS-CoV-2.  Clinical  correlation with patient history and other diagnostic information is  necessary to determine patient infection status.  Positive results do  not rule out bacterial infection or co-infection with other viruses. If result is PRESUMPTIVE POSTIVE SARS-CoV-2 nucleic acids MAY BE PRESENT.   A presumptive positive result was obtained on the submitted specimen  and confirmed on repeat testing.  While 2019 novel coronavirus  (SARS-CoV-2) nucleic acids may be present in the submitted sample  additional confirmatory testing may be necessary for epidemiological  and / or clinical management purposes  to differentiate between  SARS-CoV-2 and other Sarbecovirus currently known to infect humans.  If clinically indicated additional testing with an alternate test  methodology [phone]) is advised. The SARS-CoV-2 RNA is generally  detectable in upper and lower respiratory sp ecimens during the acute  phase of infection. The expected result is Negative. Fact Sheet for Patients:  StrictlyIdeas.no Fact Sheet for Healthcare Providers: BankingDealers.co.za This test is not yet approved or cleared by the [place] FDA and has been authorized for detection and/or diagnosis of SARS-CoV-2 by FDA under an Emergency Use Authorization (EUA).  This EUA will remain in effect (meaning this test can be used) for the duration of the COVID-19 declaration under Section 564(b)(1) of the Act, 21 U.S.C. section 360bbb-3(b)(1), unless the authorization is terminated or revoked sooner. Performed at [facility], [place] [address], [place], [place] [address]     Radiology Reports Vas [place] Abi With/wo Tbi  Result Date: 11/29/2018 LOWER EXTREMITY DOPPLER STUDY Indications: Diabetic foot infection. High Risk Factors:  Diabetes.  Performing Technologist: Yolanda [name] RVS  Examination Guidelines: A complete evaluation includes at minimum, Doppler waveform signals and systolic blood pressure reading at the level of bilateral brachial, anterior tibial, and posterior tibial arteries, when vessel segments are accessible. Bilateral testing is considered an integral part of a complete examination. Photoelectric Plethysmograph (PPG) waveforms and toe systolic pressure readings are included as required and additional duplex testing as needed. Limited examinations for reoccurring indications may be  performed as noted.  ABI Findings: +--------+------------------+-----+---------+--------+ Right   Rt Pressure (mmHg)IndexWaveform Comment  +--------+------------------+-----+---------+--------+ [id number]                    triphasic         +--------+------------------+-----+---------+--------+ PTA     151               1.14 biphasic          +--------+------------------+-----+---------+--------+ DP      160               1.20 biphasic          +--------+------------------+-----+---------+--------+ +--------+------------------+-----+---------+-------+ Left    Lt Pressure (mmHg)IndexWaveform Comment +--------+------------------+-----+---------+-------+ [id number]                    triphasic        +--------+------------------+-----+---------+-------+ PTA     147               1.11 triphasic        +--------+------------------+-----+---------+-------+ DP      148               1.11 triphasic        +--------+------------------+-----+---------+-------+ +-------+-----------+-----------+------------+------------+ ABI/TBIToday's ABIToday's TBIPrevious ABIPrevious TBI +-------+-----------+-----------+------------+------------+ Right  1.20                                           +-------+-----------+-----------+------------+------------+ Left   1.11                                            +-------+-----------+-----------+------------+------------+  Summary: Right: Resting right ankle-brachial index is within normal range. No evidence of significant right lower extremity arterial disease. Left: Resting left ankle-brachial index is within normal range. No evidence of significant left lower extremity arterial disease.  *See table(s) above for measurements and observations.    Preliminary    Xr Ankle Complete Right  Result Date: 11/08/2018 3 view radiographs of the right ankle shows a congruent mortise with no destructive changes of the fibula.  Patient has Charcot collapse through the talar neck with peripheral vascular disease and calcification of the arteries down to the ankle.   Lab Data:  CBC: Recent Labs  Lab 11/28/18 1912 11/29/18 0328  WBC 21.3* 20.2*  HGB 9.5* 8.9*  HCT 29.0* 26.4*  MCV 91.5 90.4  PLT 405* 630   Basic Metabolic Panel: Recent Labs  Lab 11/28/18 1912 11/29/18 0328  NA 134* 135  K 4.1 4.0  CL 103 106  CO2 22 23  GLUCOSE 232* 247*  BUN 19 17  CREATININE 0.89 0.97  CALCIUM 10.5* 9.7   GFR: Estimated Creatinine Clearance: 78 mL/min (by C-G formula based on SCr of 0.97 mg/dL). Liver Function Tests: Recent Labs  Lab 11/28/18 1912 11/29/18 0328  AST 33 32  ALT 22 20  ALKPHOS 287* 289*  BILITOT 3.0* 3.3*  PROT 8.6* 7.5  ALBUMIN 1.4* 1.2*   No results for input(s): LIPASE, AMYLASE in the last 168 hours. No results for input(s): AMMONIA in the last 168 hours. Coagulation Profile: No results for input(s): INR, PROTIME in the last 168 hours. Cardiac Enzymes: No results for input(s): CKTOTAL, CKMB, CKMBINDEX, TROPONINI in the last 168 hours. BNP (last 3 results) No  results for input(s): PROBNP in the last 8760 hours. HbA1C: Recent Labs    11/28/18 1912  HGBA1C 7.9*   CBG: Recent Labs  Lab 11/28/18 2159 11/29/18 0819  GLUCAP 300* 163*   Lipid Profile: No results for input(s): CHOL, HDL, LDLCALC, TRIG, CHOLHDL,  LDLDIRECT in the last 72 hours. Thyroid Function Tests: No results for input(s): TSH, T4TOTAL, FREET4, T3FREE, THYROIDAB in the last 72 hours. Anemia Panel: No results for input(s): VITAMINB12, FOLATE, FERRITIN, TIBC, IRON, RETICCTPCT in the last 72 hours. Urine analysis:    Component Value Date/Time   COLORURINE YELLOW 11/29/2018 [place] 11/29/2018 0653   LABSPEC 1.010 11/29/2018 0653   PHURINE 5.5 11/29/2018 0653   GLUCOSEU NEGATIVE 11/29/2018 0653   HGBUR TRACE (A) 11/29/2018 0653   BILIRUBINUR SMALL (A) 11/29/2018 0653   KETONESUR NEGATIVE 11/29/2018 0653   PROTEINUR NEGATIVE 11/29/2018 0653   NITRITE NEGATIVE 11/29/2018 0653   LEUKOCYTESUR NEGATIVE 11/29/2018 0653     Zephaniah Enyeart M.D. [facility] 11/29/2018, 10:33 AM  Pager: [phone] Between 7am to 7pm - call Pager - [phone]  After 7pm go to [email or web address] - password [email or web address]  Call night coverage person covering after 7pm

## 2018-11-29 NOTE — Progress Notes (Signed)
ABI has been completed.   Preliminary results in CV Proc.   Blanch Media 11/29/2018 10:26 AM

## 2018-11-29 NOTE — Progress Notes (Signed)
In and out done with 600 ml urine return. Patient resting quietly in bed. Her respiration on the monitor between 20 and 30. Patient doesn't complain of respiratory distress. MD made aware.

## 2018-11-29 NOTE — Progress Notes (Signed)
Initial Nutrition Assessment  RD working remotely.  DOCUMENTATION CODES:   Not applicable  INTERVENTION:   -D/c Glucerna Shake po BID, each supplement provides 220 kcal and 10 grams of protein -D/c Prostat BID -Ensure Max po daily, each supplement provides 150 kcal and 30 grams of protein -MVI with minerals daily  NUTRITION DIAGNOSIS:   Increased nutrient needs related to wound healing, post-op healing as evidenced by estimated needs.  GOAL:   Patient will meet greater than or equal to 90% of their needs  MONITOR:   PO intake, Supplement acceptance, Labs, Weight trends, Skin, I & O's  REASON FOR ASSESSMENT:   Malnutrition Screening Tool, Consult Wound healing  ASSESSMENT:   Valentino Noseiffany Quinley is a 43 y.o. female with medical history significant of T2DM, diabetic foot ulcer, osteomyelitis and cellulitis with MRSA bacteremia, presumed endocarditis presenting with worsening right lower extremity pain and swelling.  Pt admitted with sepsis secondary to diabetic foot infection.  Reviewed I/O's: +1.2 L x 24 hours  UOP: 1.3 L x 24 hours  Attempted to speak with pt via phone, however, unable to reach (no answer). Unable to obtain further nutrition-related history at this time.   Reviewed wt hx; pt has experienced a 3.4% wt loss over the past month, which is not significant for time frame.   Per orthopedics notes, plan for rt BKA tomorrow (11/30/18).   Per meal completion records, pt 100%. Pt also with Prostat and Glucerna ordered BID.  Albumin has a half-life of 21 days and is strongly affected by stress response and inflammatory process, therefore, do not expect to see an improvement in this lab value during acute hospitalization. When a patient presents with low albumin, it is likely skewed due to the acute inflammatory response.  Unless it is suspected that patient had poor PO intake or malnutrition prior to admission, then RD should not be consulted solely for low albumin.  Note that low albumin is no longer used to diagnose malnutrition; [place] uses the new malnutrition guidelines published by the American Society for Parenteral and Enteral Nutrition (A.S.P.E.N.) and the Academy of Nutrition and Dietetics (AND).    Lab Results  Component Value Date   HGBA1C 7.9 (H) 11/28/2018   PTA DM medications are . 30 units insulin lispro BID anDTresiba 70 units BID. Per DM coordinator note, pt with drastically improved DM control since last admission (Hgb A1c down from 11 to 7.9).   Labs reviewed: CBGS: 163 (inpatient orders for glycemic control are 0-20 units insulin aspart TID with meals, 0-5 units insulin aspart q HS, 5 units insulin aspart TID with meals, and 30 units insulin glargine BID).   NUTRITION - FOCUSED PHYSICAL EXAM:    Most Recent Value  Orbital Region  Unable to assess  Upper Arm Region  Unable to assess  Thoracic and Lumbar Region  Unable to assess  Buccal Region  Unable to assess  Temple Region  Unable to assess  Clavicle Bone Region  Unable to assess  Clavicle and Acromion Bone Region  Unable to assess  Scapular Bone Region  Unable to assess  Dorsal Hand  Unable to assess  Patellar Region  Unable to assess  Anterior Thigh Region  Unable to assess  Posterior Calf Region  Unable to assess  Edema (RD Assessment)  Unable to assess  Hair  Unable to assess  Eyes  Unable to assess  Mouth  Unable to assess  Skin  Unable to assess  Nails  Unable to assess  Diet Order:   Diet Order            Diet Carb Modified Fluid consistency: Thin; Room service appropriate? Yes  Diet effective now              EDUCATION NEEDS:   No education needs have been identified at this time  Skin:  Skin Assessment: Skin Integrity Issues: Skin Integrity Issues:: Other (Comment) Other: rt charcot foot, abscess, osteomyelitis  Last BM:  11/29/18  Height:   Ht Readings from Last 1 Encounters:  11/28/18 5\' 6"  (1.676 m)    Weight:   Wt Readings  from Last 1 Encounters:  11/29/18 74.5 kg    Ideal Body Weight:  59.1 kg  BMI:  Body mass index is 26.51 kg/m.  Estimated Nutritional Needs:   Kcal:  1850-2050  Protein:  100-115 grams  Fluid:  > 1.8 L    Edy Belt A. Mayford Knife, RD, LDN, CDCES Registered Dietitian II Certified Diabetes Care and Education Specialist Pager: [phone] After hours Pager: [phone]

## 2018-11-29 NOTE — Progress Notes (Signed)
PHARMACY - PHYSICIAN COMMUNICATION CRITICAL VALUE ALERT - BLOOD CULTURE IDENTIFICATION (BCID)  Results for orders placed or performed during the hospital encounter of 11/28/18  Blood Culture ID Panel (Reflexed) (Collected: 11/28/2018  6:29 PM)  Result Value Ref Range   Enterococcus species NOT DETECTED NOT DETECTED   Listeria monocytogenes NOT DETECTED NOT DETECTED   Staphylococcus species DETECTED (A) NOT DETECTED   Staphylococcus aureus (BCID) DETECTED (A) NOT DETECTED   Methicillin resistance DETECTED (A) NOT DETECTED   Streptococcus species NOT DETECTED NOT DETECTED   Streptococcus agalactiae NOT DETECTED NOT DETECTED   Streptococcus pneumoniae NOT DETECTED NOT DETECTED   Streptococcus pyogenes NOT DETECTED NOT DETECTED   Acinetobacter baumannii NOT DETECTED NOT DETECTED   Enterobacteriaceae species NOT DETECTED NOT DETECTED   Enterobacter cloacae complex NOT DETECTED NOT DETECTED   Escherichia coli NOT DETECTED NOT DETECTED   Klebsiella oxytoca NOT DETECTED NOT DETECTED   Klebsiella pneumoniae NOT DETECTED NOT DETECTED   Proteus species NOT DETECTED NOT DETECTED   Serratia marcescens NOT DETECTED NOT DETECTED   Haemophilus influenzae NOT DETECTED NOT DETECTED   Neisseria meningitidis NOT DETECTED NOT DETECTED   Pseudomonas aeruginosa NOT DETECTED NOT DETECTED   Candida albicans NOT DETECTED NOT DETECTED   Candida glabrata NOT DETECTED NOT DETECTED   Candida krusei NOT DETECTED NOT DETECTED   Candida parapsilosis NOT DETECTED NOT DETECTED   Candida tropicalis NOT DETECTED NOT DETECTED    Name of physician (or Provider) Contacted: none  Changes to prescribed antibiotics required: none Current ABX vancomycin - covers reported bacteria   Leota Sauers Pharm.D. CPP, BCPS Clinical Pharmacist [phone] 11/29/2018 4:37 PM

## 2018-11-29 NOTE — Progress Notes (Signed)
Patient's respiration rate is changing from low 10s to high 30s and HR is in 120s. Patient is resting and she verbalized she is feeling well. She denied any pain or other discomfort. MD made aware and per her verbal order patient will receive 1000 ml NS for 1 hr. Will continue to monitor.

## 2018-11-30 ENCOUNTER — Inpatient Hospital Stay (HOSPITAL_COMMUNITY): Payer: Medicaid Other | Admitting: Anesthesiology

## 2018-11-30 ENCOUNTER — Encounter (HOSPITAL_COMMUNITY): Payer: Self-pay

## 2018-11-30 ENCOUNTER — Encounter (HOSPITAL_COMMUNITY)
Admission: AD | Disposition: A | Discharge status: Home Health | Payer: Self-pay | Source: Other Acute Inpatient Hospital | Attending: Internal Medicine

## 2018-11-30 DIAGNOSIS — F17210 Nicotine dependence, cigarettes, uncomplicated: Secondary | ICD-10-CM

## 2018-11-30 DIAGNOSIS — Z794 Long term (current) use of insulin: Secondary | ICD-10-CM

## 2018-11-30 DIAGNOSIS — Z8614 Personal history of Methicillin resistant Staphylococcus aureus infection: Secondary | ICD-10-CM

## 2018-11-30 DIAGNOSIS — E1169 Type 2 diabetes mellitus with other specified complication: Secondary | ICD-10-CM

## 2018-11-30 DIAGNOSIS — R7881 Bacteremia: Secondary | ICD-10-CM

## 2018-11-30 DIAGNOSIS — R011 Cardiac murmur, unspecified: Secondary | ICD-10-CM

## 2018-11-30 DIAGNOSIS — Z8739 Personal history of other diseases of the musculoskeletal system and connective tissue: Secondary | ICD-10-CM

## 2018-11-30 DIAGNOSIS — Z720 Tobacco use: Secondary | ICD-10-CM

## 2018-11-30 DIAGNOSIS — B9562 Methicillin resistant Staphylococcus aureus infection as the cause of diseases classified elsewhere: Secondary | ICD-10-CM

## 2018-11-30 DIAGNOSIS — E11610 Type 2 diabetes mellitus with diabetic neuropathic arthropathy: Secondary | ICD-10-CM

## 2018-11-30 HISTORY — PX: APPLICATION OF WOUND VAC: SHX5189

## 2018-11-30 HISTORY — PX: AMPUTATION: SHX166

## 2018-11-30 LAB — BASIC METABOLIC PANEL
Anion gap: 7 (ref 5–15)
BUN: 12 mg/dL (ref 6–20)
CO2: 23 mmol/L (ref 22–32)
Calcium: 9.2 mg/dL (ref 8.9–10.3)
Chloride: 107 mmol/L (ref 98–111)
Creatinine, Ser: 0.91 mg/dL (ref 0.44–1.00)
GFR calc Af Amer: >60 mL/min (ref >60)
GFR calc non Af Amer: >60 mL/min (ref >60)
Glucose, Bld: 93 mg/dL (ref 70–99)
Potassium: 3.4 mmol/L — ABNORMAL LOW (ref 3.5–5.1)
Sodium: 137 mmol/L (ref 135–145)

## 2018-11-30 LAB — POCT PREGNANCY, URINE: Preg Test, Ur: NEGATIVE

## 2018-11-30 LAB — URINE CULTURE: Culture: NO GROWTH

## 2018-11-30 LAB — CBC
HCT: 24.2 % — ABNORMAL LOW (ref 36.0–46.0)
Hemoglobin: 8.2 g/dL — ABNORMAL LOW (ref 12.0–15.0)
MCH: 30.6 pg (ref 26.0–34.0)
MCHC: 33.9 g/dL (ref 30.0–36.0)
MCV: 90.3 fL (ref 80.0–100.0)
Platelets: 356 10*3/uL (ref 150–400)
RBC: 2.68 MIL/uL — ABNORMAL LOW (ref 3.87–5.11)
RDW: 13.4 % (ref 11.5–15.5)
WBC: 21.8 10*3/uL — ABNORMAL HIGH (ref 4.0–10.5)
nRBC: 0.2 % (ref 0.0–0.2)

## 2018-11-30 LAB — PTH, INTACT AND CALCIUM
Calcium, Total (PTH): 9.6 mg/dL (ref 8.7–10.2)
PTH: 4 pg/mL — ABNORMAL LOW (ref 15–65)

## 2018-11-30 LAB — GLUCOSE, CAPILLARY
Glucose-Capillary: 62 mg/dL — ABNORMAL LOW (ref 70–99)
Glucose-Capillary: 129 mg/dL — ABNORMAL HIGH (ref 70–99)
Glucose-Capillary: 186 mg/dL — ABNORMAL HIGH (ref 70–99)
Glucose-Capillary: 356 mg/dL — ABNORMAL HIGH (ref 70–99)

## 2018-11-30 SURGERY — AMPUTATION BELOW KNEE
Anesthesia: General | Site: Leg Lower | Laterality: Right

## 2018-11-30 MED ORDER — MEPERIDINE HCL 25 MG/ML IJ SOLN: 6.2500 mg | INTRAMUSCULAR | Status: DC | PRN

## 2018-11-30 MED ORDER — MIDAZOLAM HCL 5 MG/5ML IJ SOLN
INTRAMUSCULAR | Status: DC | PRN
  Administered 2018-11-30: 2 mg via INTRAVENOUS

## 2018-11-30 MED ORDER — SODIUM CHLORIDE 0.9 % IV SOLN
INTRAVENOUS | Status: DC
  Administered 2018-12-04 – 2018-12-21 (×2): via INTRAVENOUS

## 2018-11-30 MED ORDER — POLYETHYLENE GLYCOL 3350 17 G PO PACK: 17.0000 g | PACK | Freq: Every day | ORAL | Status: DC | PRN

## 2018-11-30 MED ORDER — ONDANSETRON HCL 4 MG/2ML IJ SOLN
INTRAMUSCULAR | Status: DC | PRN
  Administered 2018-11-30: 4 mg via INTRAVENOUS

## 2018-11-30 MED ORDER — ACETAMINOPHEN 325 MG PO TABS: 325.0000 mg | ORAL_TABLET | Freq: Four times a day (QID) | ORAL | Status: DC | PRN

## 2018-11-30 MED ORDER — METHOCARBAMOL 500 MG PO TABS: 500.0000 mg | ORAL_TABLET | Freq: Four times a day (QID) | ORAL | Status: DC | PRN

## 2018-11-30 MED ORDER — INSULIN ASPART 100 UNIT/ML ~~LOC~~ SOLN
0.0000 [IU] | Freq: Three times a day (TID) | SUBCUTANEOUS | Status: DC
  Administered 2018-11-30: 2 [IU] via SUBCUTANEOUS
  Administered 2018-12-01: 3 [IU] via SUBCUTANEOUS
  Administered 2018-12-01: 5 [IU] via SUBCUTANEOUS

## 2018-11-30 MED ORDER — SODIUM CHLORIDE 0.9 % IV SOLN
2.0000 g | Freq: Three times a day (TID) | INTRAVENOUS | Status: DC
  Administered 2018-11-30 – 2018-12-03 (×9): 2 g via INTRAVENOUS
  Filled 2018-11-30 (×10): qty 2

## 2018-11-30 MED ORDER — FENTANYL CITRATE (PF) 250 MCG/5ML IJ SOLN
INTRAMUSCULAR | Status: AC
  Filled 2018-11-30: qty 5

## 2018-11-30 MED ORDER — DEXTROSE 50 % IV SOLN
12.5000 g | INTRAVENOUS | Status: AC
  Administered 2018-11-30: 12.5 g via INTRAVENOUS

## 2018-11-30 MED ORDER — DOCUSATE SODIUM 100 MG PO CAPS
100.0000 mg | ORAL_CAPSULE | Freq: Two times a day (BID) | ORAL | Status: DC
  Administered 2018-11-30 – 2018-12-25 (×8): 100 mg via ORAL
  Filled 2018-11-30 (×23): qty 1

## 2018-11-30 MED ORDER — ONDANSETRON HCL 4 MG PO TABS: 4.0000 mg | ORAL_TABLET | Freq: Four times a day (QID) | ORAL | Status: DC | PRN

## 2018-11-30 MED ORDER — LIDOCAINE 2% (20 MG/ML) 5 ML SYRINGE
INTRAMUSCULAR | Status: DC | PRN
  Administered 2018-11-30: 60 mg via INTRAVENOUS

## 2018-11-30 MED ORDER — BISACODYL 10 MG RE SUPP
10.0000 mg | Freq: Every day | RECTAL | Status: DC | PRN
  Filled 2018-11-30: qty 1

## 2018-11-30 MED ORDER — ONDANSETRON HCL 4 MG/2ML IJ SOLN: 4.0000 mg | Freq: Once | INTRAMUSCULAR | Status: DC | PRN

## 2018-11-30 MED ORDER — MAGNESIUM CITRATE PO SOLN: 1.0000 | Freq: Once | ORAL | Status: DC | PRN

## 2018-11-30 MED ORDER — MIDAZOLAM HCL 2 MG/2ML IJ SOLN
INTRAMUSCULAR | Status: AC
  Filled 2018-11-30: qty 2

## 2018-11-30 MED ORDER — HYDROMORPHONE HCL 1 MG/ML IJ SOLN: 0.5000 mg | INTRAMUSCULAR | Status: DC | PRN

## 2018-11-30 MED ORDER — ACETAMINOPHEN 325 MG PO TABS: 325.0000 mg | ORAL_TABLET | ORAL | Status: DC | PRN

## 2018-11-30 MED ORDER — DEXTROSE 50 % IV SOLN
INTRAVENOUS | Status: AC
  Administered 2018-11-30: 12.5 g via INTRAVENOUS
  Filled 2018-11-30: qty 50

## 2018-11-30 MED ORDER — FENTANYL CITRATE (PF) 250 MCG/5ML IJ SOLN
INTRAMUSCULAR | Status: DC | PRN
  Administered 2018-11-30: 100 ug via INTRAVENOUS
  Administered 2018-11-30 (×2): 50 ug via INTRAVENOUS

## 2018-11-30 MED ORDER — DEXAMETHASONE SODIUM PHOSPHATE 10 MG/ML IJ SOLN
INTRAMUSCULAR | Status: AC
  Filled 2018-11-30: qty 1

## 2018-11-30 MED ORDER — DEXTROSE-NACL 5-0.45 % IV SOLN: INTRAVENOUS | Status: DC

## 2018-11-30 MED ORDER — ONDANSETRON HCL 4 MG/2ML IJ SOLN
INTRAMUSCULAR | Status: AC
  Filled 2018-11-30: qty 2

## 2018-11-30 MED ORDER — SUCCINYLCHOLINE CHLORIDE 200 MG/10ML IV SOSY
PREFILLED_SYRINGE | INTRAVENOUS | Status: DC | PRN
  Administered 2018-11-30: 100 mg via INTRAVENOUS

## 2018-11-30 MED ORDER — OXYCODONE HCL 5 MG PO TABS: 5.0000 mg | ORAL_TABLET | Freq: Once | ORAL | Status: DC | PRN

## 2018-11-30 MED ORDER — ACETAMINOPHEN 160 MG/5ML PO SOLN: 325.0000 mg | ORAL | Status: DC | PRN

## 2018-11-30 MED ORDER — METRONIDAZOLE 500 MG PO TABS
500.0000 mg | ORAL_TABLET | Freq: Three times a day (TID) | ORAL | Status: DC
  Administered 2018-11-30 – 2018-12-03 (×9): 500 mg via ORAL
  Filled 2018-11-30 (×9): qty 1

## 2018-11-30 MED ORDER — 0.9 % SODIUM CHLORIDE (POUR BTL) OPTIME
TOPICAL | Status: DC | PRN
  Administered 2018-11-30: 13:00:00 1000 mL

## 2018-11-30 MED ORDER — ALBUMIN HUMAN 5 % IV SOLN
INTRAVENOUS | Status: DC | PRN
  Administered 2018-11-30: 13:00:00 via INTRAVENOUS

## 2018-11-30 MED ORDER — METRONIDAZOLE IN NACL 5-0.79 MG/ML-% IV SOLN: 500.0000 mg | Freq: Three times a day (TID) | INTRAVENOUS | Status: DC

## 2018-11-30 MED ORDER — METOCLOPRAMIDE HCL 10 MG PO TABS: 5.0000 mg | ORAL_TABLET | Freq: Three times a day (TID) | ORAL | Status: DC | PRN

## 2018-11-30 MED ORDER — METHOCARBAMOL 1000 MG/10ML IJ SOLN
500.0000 mg | Freq: Four times a day (QID) | INTRAVENOUS | Status: DC | PRN
  Filled 2018-11-30: qty 5

## 2018-11-30 MED ORDER — PROPOFOL 10 MG/ML IV BOLUS
INTRAVENOUS | Status: DC | PRN
  Administered 2018-11-30: 120 mg via INTRAVENOUS

## 2018-11-30 MED ORDER — FENTANYL CITRATE (PF) 100 MCG/2ML IJ SOLN: 25.0000 ug | INTRAMUSCULAR | Status: DC | PRN

## 2018-11-30 MED ORDER — DEXAMETHASONE SODIUM PHOSPHATE 10 MG/ML IJ SOLN
INTRAMUSCULAR | Status: DC | PRN
  Administered 2018-11-30: 4 mg via INTRAVENOUS

## 2018-11-30 MED ORDER — OXYCODONE HCL 5 MG/5ML PO SOLN: 5.0000 mg | Freq: Once | ORAL | Status: DC | PRN

## 2018-11-30 MED ORDER — ONDANSETRON HCL 4 MG/2ML IJ SOLN: 4.0000 mg | Freq: Four times a day (QID) | INTRAMUSCULAR | Status: DC | PRN

## 2018-11-30 MED ORDER — LACTATED RINGERS IV SOLN
INTRAVENOUS | Status: DC
  Administered 2018-11-30 – 2018-12-07 (×2): via INTRAVENOUS

## 2018-11-30 MED ORDER — OXYCODONE HCL 5 MG PO TABS
5.0000 mg | ORAL_TABLET | ORAL | Status: DC | PRN
  Administered 2018-12-18 – 2018-12-23 (×3): 10 mg via ORAL
  Filled 2018-11-30 (×3): qty 2
  Filled 2018-11-30: qty 1

## 2018-11-30 MED ORDER — METOCLOPRAMIDE HCL 5 MG/ML IJ SOLN: 5.0000 mg | Freq: Three times a day (TID) | INTRAMUSCULAR | Status: DC | PRN

## 2018-11-30 MED ORDER — OXYCODONE HCL 5 MG PO TABS: 10.0000 mg | ORAL_TABLET | ORAL | Status: DC | PRN

## 2018-11-30 SURGICAL SUPPLY — 39 items
BLADE SAW RECIP 87.9 MT (BLADE) ×4 IMPLANT
BLADE SURG 21 STRL SS (BLADE) ×4 IMPLANT
BNDG COHESIVE 6X5 TAN STRL LF (GAUZE/BANDAGES/DRESSINGS) IMPLANT
CANISTER WOUND CARE 500ML ATS (WOUND CARE) ×4 IMPLANT
CANISTER WOUNDNEG PRESSURE 500 (CANNISTER) IMPLANT
COVER SURGICAL LIGHT HANDLE (MISCELLANEOUS) ×4 IMPLANT
COVER WAND RF STERILE (DRAPES) IMPLANT
CUFF TOURNIQUET SINGLE 34IN LL (TOURNIQUET CUFF) ×4 IMPLANT
DRAPE INCISE IOBAN 66X45 STRL (DRAPES) ×4 IMPLANT
DRAPE U-SHAPE 47X51 STRL (DRAPES) ×4 IMPLANT
DRESSING PREVENA PLUS CUSTOM (GAUZE/BANDAGES/DRESSINGS) ×2 IMPLANT
DRSG PREVENA PLUS CUSTOM (GAUZE/BANDAGES/DRESSINGS) ×4
DURAPREP 26ML APPLICATOR (WOUND CARE) ×4 IMPLANT
ELECT REM PT RETURN 9FT ADLT (ELECTROSURGICAL) ×4
ELECTRODE REM PT RTRN 9FT ADLT (ELECTROSURGICAL) ×2 IMPLANT
GLOVE BIOGEL PI IND STRL 9 (GLOVE) ×2 IMPLANT
GLOVE BIOGEL PI INDICATOR 9 (GLOVE) ×2
GLOVE SURG ORTHO 9.0 STRL STRW (GLOVE) ×4 IMPLANT
GOWN STRL REUS W/ TWL XL LVL3 (GOWN DISPOSABLE) ×4 IMPLANT
GOWN STRL REUS W/TWL XL LVL3 (GOWN DISPOSABLE) ×4
KIT BASIN OR (CUSTOM PROCEDURE TRAY) ×4 IMPLANT
KIT TURNOVER KIT B (KITS) ×4 IMPLANT
MANIFOLD NEPTUNE II (INSTRUMENTS) ×4 IMPLANT
NS IRRIG 1000ML POUR BTL (IV SOLUTION) ×4 IMPLANT
PACK ORTHO EXTREMITY (CUSTOM PROCEDURE TRAY) ×4 IMPLANT
PAD ARMBOARD 7.5X6 YLW CONV (MISCELLANEOUS) ×4 IMPLANT
PREVENA RESTOR ARTHOFORM 46X30 (CANNISTER) ×4 IMPLANT
SPONGE LAP 18X18 RF (DISPOSABLE) IMPLANT
STAPLER VISISTAT 35W (STAPLE) IMPLANT
STOCKINETTE IMPERVIOUS 9X36 MD (GAUZE/BANDAGES/DRESSINGS) ×2 IMPLANT
STOCKINETTE IMPERVIOUS LG (DRAPES) ×4 IMPLANT
SUT ETHILON 2 0 PSLX (SUTURE) IMPLANT
SUT SILK 2 0 (SUTURE) ×2
SUT SILK 2-0 18XBRD TIE 12 (SUTURE) ×2 IMPLANT
SUT VIC AB 1 CTX 27 (SUTURE) ×8 IMPLANT
TOWEL OR 17X26 10 PK STRL BLUE (TOWEL DISPOSABLE) ×4 IMPLANT
TUBE CONNECTING 12'X1/4 (SUCTIONS) ×1
TUBE CONNECTING 12X1/4 (SUCTIONS) ×3 IMPLANT
YANKAUER SUCT BULB TIP NO VENT (SUCTIONS) ×4 IMPLANT

## 2018-11-30 NOTE — Transfer of Care (Signed)
Immediate Anesthesia Transfer of Care Note  Patient: Arcadia Mingee  Procedure(s) Performed: RIGHT BELOW KNEE AMPUTATION (Right ) Application Of Wound Vac (Right Leg Lower)  Patient Location: PACU  Anesthesia Type:General  Level of Consciousness: awake, drowsy and patient cooperative  Airway & Oxygen Therapy: Patient Spontanous Breathing and Patient connected to face mask oxygen  Post-op Assessment: Report given to RN and Post -op Vital signs reviewed and stable  Post vital signs: Reviewed and stable  Last Vitals:  Vitals Value Taken Time  BP 116/79 11/30/2018  1:39 PM  Temp    Pulse 109 11/30/2018  1:39 PM  Resp 24 11/30/2018  1:39 PM  SpO2 100 % 11/30/2018  1:39 PM  Vitals shown include unvalidated device data.  Last Pain:  Vitals:   11/30/18 1143  TempSrc: Oral  PainSc:       Patients Stated Pain Goal: 2 (11/28/18 1754)  Complications: No apparent anesthesia complications

## 2018-11-30 NOTE — Anesthesia Preprocedure Evaluation (Signed)
Anesthesia Evaluation  Patient identified by MRN, date of birth, ID band Patient awake    Reviewed: Allergy & Precautions, NPO status , Patient's Chart, lab work & pertinent test results  Airway Mallampati: II  TM Distance: >3 FB Neck ROM: Full    Dental no notable dental hx.    Pulmonary neg pulmonary ROS, Current Smoker,    Pulmonary exam normal breath sounds clear to auscultation       Cardiovascular negative cardio ROS Normal cardiovascular exam Rhythm:Regular Rate:Normal  Echo 3/20  Left Ventricle: The left ventricle has normal systolic function, with an ejection fraction of 60-65%. The cavity size was normal. There is mildly increased left ventricular wall thickness/   Neuro/Psych  Neuromuscular disease negative psych ROS   GI/Hepatic negative GI ROS, Neg liver ROS,   Endo/Other  diabetes, Type 2, Insulin Dependent  Renal/GU negative Renal ROS  negative genitourinary   Musculoskeletal  (+) Arthritis , Osteoarthritis,    Abdominal   Peds negative pediatric ROS (+)  Hematology negative hematology ROS (+)   Anesthesia Other Findings   Reproductive/Obstetrics negative OB ROS                             Anesthesia Physical Anesthesia Plan  ASA: III  Anesthesia Plan: General   Post-op Pain Management:    Induction: Intravenous  PONV Risk Score and Plan: 3 and Ondansetron and Treatment may vary due to age or medical condition  Airway Management Planned: Oral ETT and LMA  Additional Equipment:   Intra-op Plan:   Post-operative Plan: Extubation in OR  Informed Consent:   Plan Discussed with: CRNA, Anesthesiologist and Surgeon  Anesthesia Plan Comments: (  )        Anesthesia Quick Evaluation

## 2018-11-30 NOTE — Progress Notes (Signed)
After reviewing Dr. Audrie Lia post-op note we will continue the Cefepime and Flagyl x three days given the surgical findings. Okay to discontinue cefepime and flagyl on 5/26. Will continue Vancomycin for her bacteremia as previously mentioned in my earlier note.

## 2018-11-30 NOTE — Progress Notes (Signed)
Patient Alert and orientedx3 transferred to short stay for surgery no changes on tele monitor remains sinus tach hr 110s.

## 2018-11-30 NOTE — Interval H&P Note (Signed)
History and Physical Interval Note:  11/30/2018 7:05 AM  Yolanda [name]  has presented today for surgery, with the diagnosis of Abscess, Osteomyelitis Right Foot.  The various methods of treatment have been discussed with the patient and family. After consideration of risks, benefits and other options for treatment, the patient has consented to  Procedure(s): RIGHT BELOW KNEE AMPUTATION (Right) as a surgical intervention.  The patient's history has been reviewed, patient examined, no change in status, stable for surgery.  I have reviewed the patient's chart and labs.  Questions were answered to the patient's satisfaction.     Lazaro Arms, PA-C [facility] [phone]

## 2018-11-30 NOTE — Progress Notes (Addendum)
Hypoglycemic Event  CBG: 62  Treatment: D50 25 mL (12.5 gm)  Symptoms: sleepy  Follow-up CBG: Time:0940 CBG Result:129  Possible Reasons for Event: Other: NPO and given scheduled Lantus 5/21  Comments/MD notified:DR Rai paged.    Loomis Anacker

## 2018-11-30 NOTE — Progress Notes (Signed)
BG 70. Patient given 240 ml OJ d/t being NPO after midnight. BG 106. Patient eating graham crackers and given 120 ml OJ for maintenance. Will continue to monitor.

## 2018-11-30 NOTE — Progress Notes (Signed)
Patient used bedpan, urinating and having BM. Approximately, 10 minutes later, patient felt like she needed to have a BM again, but no results. With patient turning, right foot wound started to bleed through drsg. Drsg. Changed. Large amt of bright red drng. With tan drng. And some blood clots. Patient tolerated well. Will continue to monitor.

## 2018-11-30 NOTE — Progress Notes (Signed)
Patient is scheduled for TEE on Tues, 12/04/18 at noon. NPO Monday night.  Orders and consent to follow.  Roe Rutherford Morty Ortwein, PA-C 11/30/2018, 2:15 PM

## 2018-11-30 NOTE — Progress Notes (Signed)
Orthopedic Tech Progress Note Patient Details:  Yolanda [name] [birth year] [id number]  Patient ID: [name], female   DOB: [birth year], 43 y.o.   MRN: [id number]   Saul Fordyce 11/30/2018, 3:09 PMCalled [facility] for right stump shrinnker with limb guard.

## 2018-11-30 NOTE — Anesthesia Procedure Notes (Signed)
Procedure Name: Intubation Date/Time: 11/30/2018 12:50 PM Performed by: Adria Dill, CRNA Pre-anesthesia Checklist: Patient identified, Emergency Drugs available, Suction available and Patient being monitored Patient Re-evaluated:Patient Re-evaluated prior to induction Oxygen Delivery Method: Circle system utilized Preoxygenation: Pre-oxygenation with 100% oxygen Induction Type: IV induction and Rapid sequence Laryngoscope Size: Miller and 2 Grade View: Grade I Tube type: Oral Tube size: 7.0 mm Number of attempts: 1 Airway Equipment and Method: Stylet and Oral airway Placement Confirmation: ETT inserted through vocal cords under direct vision,  positive ETCO2 and breath sounds checked- equal and bilateral Secured at: 21 cm Tube secured with: Tape Dental Injury: Teeth and Oropharynx as per pre-operative assessment

## 2018-11-30 NOTE — Op Note (Signed)
   Date of Surgery: 11/30/2018  INDICATIONS: Ms. 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  PREOPERATIVE DIAGNOSIS: Abscess, osteomyelitis right charcot foot  POSTOPERATIVE DIAGNOSIS: Same.  PROCEDURE: Transtibial amputation Application of Prevena wound VAC  SURGEON: Lajoyce Corners, M.D.  ANESTHESIA:  general  IV FLUIDS AND URINE: See anesthesia.  ESTIMATED BLOOD LOSS: min mL.  COMPLICATIONS: None.  DESCRIPTION OF PROCEDURE: The patient was brought to the operating room and underwent a general anesthetic. After adequate levels of anesthesia were obtained patient's lower extremity was prepped using DuraPrep draped into a sterile field. A timeout was called. The foot was draped out of the sterile field with impervious stockinette. A transverse incision was made 11 cm distal to the tibial tubercle. This curved proximally and a large posterior flap was created. The tibia was transected 1 cm proximal to the skin incision. The fibula was transected just proximal to the tibial incision. The tibia was beveled anteriorly. A large posterior flap was created. The sciatic nerve was pulled cut and allowed to retract. The vascular bundles were suture ligated with 2-0 silk. The deep and superficial fascial layers were closed using #1 Vicryl. The skin was closed using staples and 2-0 nylon. The wound was covered with a Prevena wound VAC. There was a good suction fit. A prosthetic shrinker will be applied. Patient was extubated taken to the PACU in stable condition.   DISCHARGE PLANNING:  Antibiotic duration:continue IV antibiotic for 3 days post op, there was a lot of fluid in tissue planes at the mid calf, no necrotic fascia, no purulence  Weightbearing: NWB right  Pain medication: opoid pathway  Dressing care/ Wound GYF:VCBSWHQP for 1 week  Discharge to: SNF or CIR  Follow-up: In the office 1 week post operative.  Aldean Baker, MD [facility] 1:45 PM

## 2018-11-30 NOTE — Progress Notes (Addendum)
[facility]                                                                              Patient Demographics  Yolanda [name], is a 43 y.o. female, DOB - 1976-06-27, HGD:[id number]  Admit date - 11/28/2018   Admitting Physician Vianne Bulls, MD  Outpatient Primary MD for the patient is System, Pcp Not In  Outpatient specialists:   LOS - 2  days   Medical records reviewed and are as summarized below:    No chief complaint on file.      Brief summary   Patient is a 43 year old female with diabetes type 2, diabetic foot ulcer, osteomyelitis and cellulitis with MRSA bacteremia, presumed endocarditis presented with right lower extremity pain, swelling of her right lower extremity.  She was recently discharged in April for MRSA bacteremia with presumed endocarditis, failed daptomycin and was transitioned to vancomycin (supposed to complete on 11/20/2018).  Patient was seen at [facility] rocking him, CT findings concerning for destruction of the entire mid and hindfoot with fluid and gas collections, surrounding cellulitis. 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    Diabetes mellitus type 2, uncontrolled, with complications,  foot infection ([place]) -This morning, hypoglycemia due to n.p.o. status -Placed on low-dose D5 drip until patient is back from surgery and eating -Continue current insulin regimen, Lantus 30 units twice daily, NovoLog meal coverage, decrease sliding scale to sensitive  Essential hypertension BP currently soft, continue to hold antihypertensives  Right lower extremity DVT -Diagnosed in 07/2018, used to be on Xarelto, she completed the starter pack however Xarelto was discontinued due to bleeding issues at [facility] in 09/2018.  Right lower extremity Dopplers on 3/29 did not show any DVT hence Xarelto was not resumed.  Hypercalcemia -Likely due to sepsis, dehydration, calcium 10.5 with a time of admission, improved to 9.7  Severe protein calorie malnutrition -Albumin 1.2 -Placed on pro-stat, nutritional supplements  Acute on chronic anemia  -H&H currently stable baseline 9-10 -Hemoglobin 7.4 at OSH, transfused 1 unit packed RBC,  -H&H currently stable  Urinary retention Planing of being unable to urinate, bladder scan showed 415 cc. -UA negative for UTI  - Flomax for 7 days -Foley catheter if urinary retention continues   Code Status: Full code DVT Prophylaxis: SCDs, will place on heparin subcu after surgery Family Communication: Discussed in detail with the patient, all imaging results, lab results explained to the patient    Disposition Plan: Remains inpatient, BKA today  Time Spent in  minutes 25 minutes  Procedures:  Transtibial amputation 5/22  Consultants:   Orthopedics, Dr. Sharol Given  Antimicrobials:   Anti-infectives (From admission, onward)   Start     Dose/Rate Route Frequency Ordered Stop   11/30/18 1500  ceFEPIme (MAXIPIME) 2 g in sodium chloride 0.9 % 100 mL IVPB     2 g 200 mL/hr over 30 Minutes Intravenous Every 8 hours 11/30/18 1416     11/30/18 1500  metroNIDAZOLE (FLAGYL) tablet 500 mg     500 mg Oral Every 8 hours 11/30/18 1416     11/30/18  1415  metroNIDAZOLE (FLAGYL) IVPB 500 mg  Status:  Discontinued     500 mg 100 mL/hr over 60 Minutes Intravenous Every 8 hours 11/30/18 1411 11/30/18 1416   11/28/18 2045  vancomycin (VANCOCIN) 1,750 mg in sodium chloride 0.9 % 500 mL IVPB     1,750 mg 250 mL/hr over 120 Minutes Intravenous Every 24 hours 11/28/18 2032     11/28/18 2030  vancomycin (VANCOCIN) 1,250 mg in sodium chloride 0.9 % 250 mL IVPB  Status:  Discontinued     1,250 mg 166.7 mL/hr over 90 Minutes Intravenous Every 24 hours 11/28/18 2004 11/28/18 2032   11/28/18 1900  ceFEPIme (MAXIPIME) 2 g in sodium chloride 0.9 % 100 mL IVPB  Status:  Discontinued     2 g 200 mL/hr over 30 Minutes Intravenous Every 8 hours 11/28/18 1848 11/30/18 1304   11/28/18 1815  metroNIDAZOLE (FLAGYL) tablet 500 mg  Status:  Discontinued     500 mg Oral Every 8 hours 11/28/18 1803 11/30/18 1304         Medications  Scheduled Meds: . Chlorhexidine Gluconate Cloth  6 each Topical Q0600  . docusate sodium  100 mg Oral BID  . insulin aspart  0-5 Units Subcutaneous QHS  . insulin aspart  0-9 Units Subcutaneous TID WC  . insulin aspart  5 Units Subcutaneous TID WC  . insulin glargine  30 Units Subcutaneous BID  . metroNIDAZOLE  500 mg Oral Q8H  . multivitamin with minerals  1 tablet Oral Daily  . mupirocin ointment  1 application Nasal BID  . Ensure Max Protein  11 oz Oral Daily  . tamsulosin  0.4 mg Oral QPC breakfast   Continuous Infusions: . sodium chloride 250 mL (11/30/18 0650)  . sodium chloride    . ceFEPime (MAXIPIME) IV    . dextrose 5 % and 0.45% NaCl    . lactated ringers Stopped (11/30/18 1330)  . methocarbamol (ROBAXIN) IV    . vancomycin Stopped (11/30/18 0210)   PRN Meds:.sodium chloride, acetaminophen **OR** acetaminophen, bisacodyl, HYDROmorphone (DILAUDID) injection, magnesium citrate, methocarbamol **OR** methocarbamol (ROBAXIN) IV, metoCLOPramide **OR** metoCLOPramide (REGLAN) injection, ondansetron **OR**  ondansetron (ZOFRAN) IV, oxyCODONE, oxyCODONE, polyethylene glycol      Subjective:   Yolanda [name] was seen and examined today.  Seen this morning, awaiting surgery at that time.  Had hypoglycemia episode earlier.  Pain controlled in the right lower extremity.  No fevers or chills.   Patient denies dizziness, chest pain, shortness of breath, abdominal pain, N/V/D/C.  No acute events overnight.  Objective:   Vitals:   11/30/18 1143 11/30/18 1339 11/30/18 1355 11/30/18 1410  BP:  116/79 123/80 129/86  Pulse: (!) 111 (!) 109 (!) 109 (!) 106  Resp: (!) 35 (!) 24 20 (!) 22  Temp: 98.3 F (36.8 C) 98.5 F (36.9 C)  98.5 F (36.9 C)  TempSrc: Oral     SpO2: 95%  100% 94% 92%  Weight:      Height:        Intake/Output Summary (Last 24 hours) at 11/30/2018 1523 Last data filed at 11/30/2018 1330 Gross per 24 hour  Intake 5889.28 ml  Output 1575 ml  Net 4314.28 ml     Wt Readings from Last 3 Encounters:  11/30/18 74.8 kg  11/08/18 77.1 kg  10/24/18 77.1 kg    Physical Exam  General: Alert and oriented x 3, NAD  Eyes:  HEENT:  Atraumatic, normocephalic  Cardiovascular: S1 S2 clear, RRR. No pedal edema b/l  Respiratory: CTAB, no wheezing, rales or rhonchi  Gastrointestinal: Soft, nontender, nondistended, NBS  Ext: no pedal edema bilaterally  Neuro: no new deficits  Musculoskeletal: No cyanosis, clubbing  Skin: Dressing intact right lower extremity  Psych: Normal affect and demeanor, alert and oriented x3    Data Reviewed:  I have personally reviewed following labs and imaging studies  Micro Results Recent Results (from the past 240 hour(s))  MRSA PCR Screening     Status: Abnormal   Collection Time: 11/28/18  5:30 PM  Result Value Ref Range Status   MRSA by PCR POSITIVE (A) NEGATIVE Final    Comment:        The GeneXpert MRSA Assay (FDA approved for NASAL specimens only), is one component of a comprehensive MRSA colonization surveillance program. It  is not intended to diagnose MRSA infection nor to guide or monitor treatment for MRSA infections. RESULT CALLED TO, READ BACK BY AND VERIFIED WITHJefferson Fuel RN 11/28/18 1928 JDW Performed at [facility], [address], [place], [place] [address]   SARS Coronavirus 2 [facility] order, Performed in [facility] hospital lab)     Status: None   Collection Time: 11/28/18  6:29 PM  Result Value Ref Range Status   SARS Coronavirus 2 NEGATIVE NEGATIVE Final    Comment: (NOTE) If result is NEGATIVE SARS-CoV-2 target nucleic acids are NOT DETECTED. The SARS-CoV-2 RNA is generally detectable in upper and lower  respiratory specimens during the acute phase of infection. The lowest  concentration of SARS-CoV-2 viral copies this assay can detect is 250  copies / mL. A negative result does not preclude SARS-CoV-2 infection  and should not be used as the sole basis for treatment or other  patient management decisions.  A negative result may occur with  improper specimen collection / handling, submission of specimen other  than nasopharyngeal swab, presence of viral mutation(s) within the  areas targeted by this assay, and inadequate number of viral copies  (<250 copies / mL). A negative result must be combined with clinical  observations, patient history, and epidemiological information. If result is POSITIVE SARS-CoV-2 target nucleic acids are DETECTED. The SARS-CoV-2 RNA is generally detectable in upper and lower  respiratory specimens dur ing the acute phase of infection.  Positive  results are indicative of active infection with SARS-CoV-2.  Clinical  correlation with patient history and other diagnostic information is  necessary to determine patient infection status.  Positive results do  not rule out bacterial infection or co-infection with other viruses. If result is PRESUMPTIVE POSTIVE SARS-CoV-2 nucleic acids MAY BE PRESENT.   A presumptive positive result was obtained on the  submitted specimen  and confirmed on repeat testing.  While 2019 novel coronavirus  (SARS-CoV-2) nucleic acids may be present in the submitted sample  additional confirmatory testing may be necessary for epidemiological  and / or clinical management purposes  to  differentiate between  SARS-CoV-2 and other Sarbecovirus currently known to infect humans.  If clinically indicated additional testing with an alternate test  methodology [id number]) is advised. The SARS-CoV-2 RNA is generally  detectable in upper and lower respiratory sp ecimens during the acute  phase of infection. The expected result is Negative. Fact Sheet for Patients:  StrictlyIdeas.no Fact Sheet for Healthcare Providers: BankingDealers.co.za This test is not yet approved or cleared by the [place] FDA and has been authorized for detection and/or diagnosis of SARS-CoV-2 by FDA under an Emergency Use Authorization (EUA).  This EUA will remain in effect (meaning this test can be used) for the duration of the COVID-19 declaration under Section 564(b)(1) of the Act, 21 U.S.C. section 360bbb-3(b)(1), unless the authorization is terminated or revoked sooner. Performed at [facility], [place] [address] [place], [place] [address]   Culture, blood (routine x 2)     Status: Abnormal (Preliminary result)   Collection Time: 11/28/18  6:29 PM  Result Value Ref Range Status   Specimen Description BLOOD RIGHT ARM  Final   Special Requests   Final    BOTTLES DRAWN AEROBIC AND ANAEROBIC Blood Culture adequate volume   Culture  Setup Time   Final    GRAM POSITIVE COCCI IN BOTH AEROBIC AND ANAEROBIC BOTTLES CRITICAL RESULT CALLED TO, READ BACK BY AND VERIFIED WITHBronwen Betters [facility] 0177 11/29/18 A BROWNING Performed at [facility], [place] [address], [place], [place] [address]    Culture STAPHYLOCOCCUS AUREUS (A)  Final   Report Status PENDING  Incomplete  Blood Culture ID  Panel (Reflexed)     Status: Abnormal   Collection Time: 11/28/18  6:29 PM  Result Value Ref Range Status   Enterococcus species NOT DETECTED NOT DETECTED Final   Listeria monocytogenes NOT DETECTED NOT DETECTED Final   Staphylococcus species DETECTED (A) NOT DETECTED Final    Comment: CRITICAL RESULT CALLED TO, READ BACK BY AND VERIFIED WITH: Bronwen Betters PHARMD 1612 11/29/18 A BROWNING    Staphylococcus aureus (BCID) DETECTED (A) NOT DETECTED Final    Comment: Methicillin (oxacillin)-resistant Staphylococcus aureus (MRSA). MRSA is predictably resistant to beta-lactam antibiotics (except ceftaroline). Preferred therapy is vancomycin unless clinically contraindicated. Patient requires contact precautions if  hospitalized. CRITICAL RESULT CALLED TO, READ BACK BY AND VERIFIED WITH: L CURRAN PHARMD 1612 11/29/18 A BROWNING    Methicillin resistance DETECTED (A) NOT DETECTED Final    Comment: CRITICAL RESULT CALLED TO, READ BACK BY AND VERIFIED WITH: L CURRAN PHARMD 1612 11/29/18 A BROWNING    Streptococcus species NOT DETECTED NOT DETECTED Final   Streptococcus agalactiae NOT DETECTED NOT DETECTED Final   Streptococcus pneumoniae NOT DETECTED NOT DETECTED Final   Streptococcus pyogenes NOT DETECTED NOT DETECTED Final   Acinetobacter baumannii NOT DETECTED NOT DETECTED Final   Enterobacteriaceae species NOT DETECTED NOT DETECTED Final   Enterobacter cloacae complex NOT DETECTED NOT DETECTED Final   Escherichia coli NOT DETECTED NOT DETECTED Final   Klebsiella oxytoca NOT DETECTED NOT DETECTED Final   Klebsiella pneumoniae NOT DETECTED NOT DETECTED Final   Proteus species NOT DETECTED NOT DETECTED Final   Serratia marcescens NOT DETECTED NOT DETECTED Final   Haemophilus influenzae NOT DETECTED NOT DETECTED Final   Neisseria meningitidis NOT DETECTED NOT DETECTED Final   Pseudomonas aeruginosa NOT DETECTED NOT DETECTED Final   Candida albicans NOT DETECTED NOT DETECTED Final   Candida glabrata  NOT DETECTED NOT DETECTED Final   Candida krusei NOT DETECTED NOT  DETECTED Final   Candida parapsilosis NOT DETECTED NOT DETECTED Final   Candida tropicalis NOT DETECTED NOT DETECTED Final    Comment: Performed at [facility], [place] [address] [place], [place] [address]  Culture, blood (routine x 2)     Status: Abnormal (Preliminary result)   Collection Time: 11/28/18  7:29 PM  Result Value Ref Range Status   Specimen Description BLOOD LEFT ARM  Final   Special Requests   Final    BOTTLES DRAWN AEROBIC AND ANAEROBIC Blood Culture adequate volume   Culture  Setup Time   Final    GRAM POSITIVE COCCI IN BOTH AEROBIC AND ANAEROBIC BOTTLES CRITICAL VALUE NOTED.  VALUE IS CONSISTENT WITH PREVIOUSLY REPORTED AND CALLED VALUE. Performed at [facility], [place] [address] [place], [place] [address]    Culture STAPHYLOCOCCUS AUREUS (A)  Final   Report Status PENDING  Incomplete  Culture, Urine     Status: None   Collection Time: 11/29/18 12:58 PM  Result Value Ref Range Status   Specimen Description URINE, CATHETERIZED  Final   Special Requests NONE  Final   Culture   Final    NO GROWTH Performed at [facility], [place] [address], [place],  [address]    Report Status 11/30/2018 FINAL  Final    Radiology Reports Vas [place] Abi With/wo Tbi  Result Date: 11/29/2018 LOWER EXTREMITY DOPPLER STUDY Indications: Diabetic foot infection. High Risk Factors: Diabetes.  Performing Technologist: Abram Sander RVS  Examination Guidelines: A complete evaluation includes at minimum, Doppler waveform signals and systolic blood pressure reading at the level of bilateral brachial, anterior tibial, and posterior tibial arteries, when vessel segments are accessible. Bilateral testing is considered an integral part of a complete examination. Photoelectric Plethysmograph (PPG) waveforms and toe systolic pressure readings are included as required and additional duplex testing as needed. Limited  examinations for reoccurring indications may be performed as noted.  ABI Findings: +--------+------------------+-----+---------+--------+ Right   Rt Pressure (mmHg)IndexWaveform Comment  +--------+------------------+-----+---------+--------+ [id number]                    triphasic         +--------+------------------+-----+---------+--------+ PTA     151               1.14 biphasic          +--------+------------------+-----+---------+--------+ DP      160               1.20 biphasic          +--------+------------------+-----+---------+--------+ +--------+------------------+-----+---------+-------+ Left    Lt Pressure (mmHg)IndexWaveform Comment +--------+------------------+-----+---------+-------+ [id number]                    triphasic        +--------+------------------+-----+---------+-------+ PTA     147               1.11 triphasic        +--------+------------------+-----+---------+-------+ DP      148               1.11 triphasic        +--------+------------------+-----+---------+-------+ +-------+-----------+-----------+------------+------------+ ABI/TBIToday's ABIToday's TBIPrevious ABIPrevious TBI +-------+-----------+-----------+------------+------------+ Right  1.20                                           +-------+-----------+-----------+------------+------------+ Left   1.11                                           +-------+-----------+-----------+------------+------------+  Summary: Right: Resting right ankle-brachial index is within normal range. No evidence of significant right lower extremity arterial disease. Left: Resting left ankle-brachial index is within normal range. No evidence of significant left lower extremity arterial disease.  *See table(s) above for measurements and observations.  Electronically signed by Monica Martinez MD on 11/29/2018 at 5:28:23 PM.   Final    Xr Ankle Complete Right  Result Date:  11/08/2018 3 view radiographs of the right ankle shows a congruent mortise with no destructive changes of the fibula.  Patient has Charcot collapse through the talar neck with peripheral vascular disease and calcification of the arteries down to the ankle.   Lab Data:  CBC: Recent Labs  Lab 11/28/18 1912 11/29/18 0328 11/30/18 0319  WBC 21.3* 20.2* 21.8*  HGB 9.5* 8.9* 8.2*  HCT 29.0* 26.4* 24.2*  MCV 91.5 90.4 90.3  PLT 405* 363 381   Basic Metabolic Panel: Recent Labs  Lab 11/28/18 1912 11/29/18 0328 11/30/18 0319  NA 134* 135 137  K 4.1 4.0 3.4*  CL 103 106 107  CO2 _0 GLUCOSE 232* 247* 93  BUN _1 CREATININE 0.89 0.97 0.91  CALCIUM 10.5* 9.7  9.6 9.2   GFR: Estimated Creatinine Clearance: 83.3 mL/min (by C-G formula based on SCr of 0.91 mg/dL). Liver Function Tests: Recent Labs  Lab 11/28/18 1912 11/29/18 0328  AST 33 32  ALT 22 20  ALKPHOS 287* 289*  BILITOT 3.0* 3.3*  PROT 8.6* 7.5  ALBUMIN 1.4* 1.2*   No results for input(s): LIPASE, AMYLASE in the last 168 hours. No results for input(s): AMMONIA in the last 168 hours. Coagulation Profile: No results for input(s): INR, PROTIME in the last 168 hours. Cardiac Enzymes: No results for input(s): CKTOTAL, CKMB, CKMBINDEX, TROPONINI in the last 168 hours. BNP (last 3 results) No results for input(s): PROBNP in the last 8760 hours. HbA1C: Recent Labs    11/28/18 1912  HGBA1C 7.9*   CBG: Recent Labs  Lab 11/29/18 1639 11/29/18 2216 11/29/18 2325 11/30/18 0820 11/30/18 0939  GLUCAP 136* 70 106* 62* 129*   Lipid Profile: No results for input(s): CHOL, HDL, LDLCALC, TRIG, CHOLHDL, LDLDIRECT in the last 72 hours. Thyroid Function Tests: No results for input(s): TSH, T4TOTAL, FREET4, T3FREE, THYROIDAB in the last 72 hours. Anemia Panel: No results for input(s): VITAMINB12, FOLATE, FERRITIN, TIBC, IRON, RETICCTPCT in the last 72 hours. Urine analysis:    Component Value Date/Time    COLORURINE YELLOW 11/29/2018 [place] 11/29/2018 0653   LABSPEC 1.010 11/29/2018 0653   PHURINE 5.5 11/29/2018 0653   GLUCOSEU NEGATIVE 11/29/2018 0653   HGBUR TRACE (A) 11/29/2018 0653   BILIRUBINUR SMALL (A) 11/29/2018 0653   KETONESUR NEGATIVE 11/29/2018 0653   PROTEINUR NEGATIVE 11/29/2018 0653   NITRITE NEGATIVE 11/29/2018 0653   LEUKOCYTESUR NEGATIVE 11/29/2018 0653     Ripudeep Rai M.D. [facility] 11/30/2018, 3:23 PM  Pager: [phone] Between 7am to 7pm - call Pager - [phone]  After 7pm go to [email or web address] - password [email or web address]  Call night coverage person covering after 7pm

## 2018-11-30 NOTE — Progress Notes (Signed)
Returned from PACU s/p right bka wound vac -125. Ridged knee brace intact. Able to move leg, phantom sensation below amputation site. Vitals stable HR remains 110s. Denies pain.

## 2018-11-30 NOTE — Progress Notes (Addendum)
[facility]  Date of Admission:  11/28/2018   Total days of antibiotics 3        Day 3 metronidazole        Day 3 cefepime        Day 3 Vancomycin ASSESSMENT: MRSA bacteremia in 2 out of 2 blood cultures likely secondary to her subacute osteomyelitis of the right foot/ankle. She was initially seen and treated for this with a appropriate recommendation for a 6 week course of IV vancomycin to have concluded on 11/20/18. It is uncertain if this course was completed as she did not return for her follow-up visit. Her return likely represents a persistent infection rather than reinfection due to poor vascularization. The patients long standing diabetes and tobacco use has undoubtedly lead to micro/macrovascular deterioration and injury affecting the delivery of vancomycin to the RLE bone/tissue preventing sufficient eradication of the MRSA bacteria.  She is to undergo amputation of the infected site as per her orthopedic team to attain source control today. Have discontinued cefepime and flagyl post surgically.  Patient will need repeat blood cultures negative x 48 hours prior to PICC line placement for a minimum of 4-6 weeks of IV vancomycin pending echocardiogram results. Patient will likely need TEE, will consult cardiology to arrange. They would be able to complete this on the 26th at the earliest.   PLAN: 1. Continue IV vancomycin  2. Discontinue cefepime and flagyl 3. Ordered repeat blood cultures   Principal Problem:   MRSA bacteremia Active Problems:   Subacute osteomyelitis, right ankle and foot (HCC)   Diabetes mellitus type 2, uncontrolled, with complications (HCC)   Charcot foot due to diabetes mellitus (HCC)   Cigarette smoker   Severe protein-calorie malnutrition (HCC)   Diabetic polyneuropathy associated with type 2 diabetes mellitus (HCC)   Cutaneous abscess of right foot   Scheduled Meds: . [MAR Hold] Chlorhexidine Gluconate Cloth  6  each Topical Q0600  . [MAR Hold] insulin aspart  0-5 Units Subcutaneous QHS  . [MAR Hold] insulin aspart  0-9 Units Subcutaneous TID WC  . [MAR Hold] insulin aspart  5 Units Subcutaneous TID WC  . [MAR Hold] insulin glargine  30 Units Subcutaneous BID  . [MAR Hold] metroNIDAZOLE  500 mg Oral Q8H  . [MAR Hold] multivitamin with minerals  1 tablet Oral Daily  . [MAR Hold] mupirocin ointment  1 application Nasal BID  . [MAR Hold] Ensure Max Protein  11 oz Oral Daily  . [MAR Hold] tamsulosin  0.4 mg Oral QPC breakfast   Continuous Infusions: . [MAR Hold] sodium chloride 250 mL (11/30/18 0650)  . [MAR Hold] ceFEPime (MAXIPIME) IV 200 mL/hr at 11/30/18 0700  . dextrose 5 % and 0.45% NaCl    . lactated ringers 10 mL/hr at 11/30/18 1201  . [MAR Hold] vancomycin Stopped (11/30/18 0210)   PRN Meds:.[MAR Hold] sodium chloride, [MAR Hold] acetaminophen **OR** [MAR Hold] acetaminophen, [MAR Hold] ondansetron **OR** [MAR Hold] ondansetron (ZOFRAN) IV, [MAR Hold] oxyCODONE, [MAR Hold] polyethylene glycol  Brief History: Yolanda [name] is a 43 yo F w/ a PMHx notable for poorly controlled insulin dependent DMII, Charcot foot RLE, tobacco use disorder, and recent MRSA osteomyelitis who presented to the hospital for increasing pain and discharge of her RLE wound. Repeat blood cultures on admission were positive for MRSA in 2/2, CBC with marked leukocytosis of 21.3, CRP 22.1, Sed rate of 136 with RLE plain films demonstrating destructive changes concerning  for osteomyelitis with resulting bacteremia. ID was consulted to assist with treatment.   SUBJECTIVE: The patient stated today that she believed she discontinued the antibiotics on the 8th of this month. She confirmed the above history. She is in agreement with the plan to move forward with surgery today and IV antibiotics. She denied fever, but endorses generalized weakness.   Review of Systems: ROS negative except as per HPI.  No Known Allergies   OBJECTIVE: Vitals:   11/30/18 0236 11/30/18 0332 11/30/18 0735 11/30/18 1143  BP:  118/75 120/76   Pulse: (!) 112 (!) 112 (!) 109 (!) 111  Resp: 14 (!) 23 (!) 34 (!) 35  Temp:  98.5 F (36.9 C) 98 F (36.7 C) 98.3 F (36.8 C)  TempSrc:  Axillary Oral Oral  SpO2: 92% 93% 90% 95%  Weight:  74.8 kg    Height:       Body mass index is 26.62 kg/m.  Physical Exam Constitutional:      General: She is not in acute distress.    Appearance: She is well-developed. She is not diaphoretic.  Neck:     Musculoskeletal: Normal range of motion and neck supple.  Cardiovascular:     Rate and Rhythm: Normal rate and regular rhythm.     Heart sounds: Murmur (Early systolic ejection murmur) present.  Pulmonary:     Effort: Pulmonary effort is normal. No respiratory distress.     Breath sounds: Normal breath sounds.  Musculoskeletal:     Comments: RLE wound wrapped, no visible edema, erythema or discharge.  Skin:    General: Skin is warm.     Capillary Refill: Capillary refill takes less than 2 seconds.  Neurological:     Mental Status: She is alert and oriented to person, place, and time.    Lab Results Lab Results  Component Value Date   WBC 21.8 (H) 11/30/2018   HGB 8.2 (L) 11/30/2018   HCT 24.2 (L) 11/30/2018   MCV 90.3 11/30/2018   PLT 356 11/30/2018    Lab Results  Component Value Date   CREATININE 0.91 11/30/2018   BUN 12 11/30/2018   NA 137 11/30/2018   K 3.4 (L) 11/30/2018   CL 107 11/30/2018   CO2 23 11/30/2018    Lab Results  Component Value Date   ALT 20 11/29/2018   AST 32 11/29/2018   ALKPHOS 289 (H) 11/29/2018   BILITOT 3.3 (H) 11/29/2018     Microbiology: Recent Results (from the past 240 hour(s))  MRSA PCR Screening     Status: Abnormal   Collection Time: 11/28/18  5:30 PM  Result Value Ref Range Status   MRSA by PCR POSITIVE (A) NEGATIVE Final    Comment:        The GeneXpert MRSA Assay (FDA approved for NASAL specimens only), is one component of  a comprehensive MRSA colonization surveillance program. It is not intended to diagnose MRSA infection nor to guide or monitor treatment for MRSA infections. RESULT CALLED TO, READ BACK BY AND VERIFIED WITHSilverio Decamp RN 11/28/18 1928 JDW Performed at [facility], [address], [place], [place] [address]   SARS Coronavirus 2 [facility] order, Performed in [facility] hospital lab)     Status: None   Collection Time: 11/28/18  6:29 PM  Result Value Ref Range Status   SARS Coronavirus 2 NEGATIVE NEGATIVE Final    Comment: (NOTE) If result is NEGATIVE SARS-CoV-2 target nucleic acids are NOT DETECTED. The SARS-CoV-2 RNA  is generally detectable in upper and lower  respiratory specimens during the acute phase of infection. The lowest  concentration of SARS-CoV-2 viral copies this assay can detect is 250  copies / mL. A negative result does not preclude SARS-CoV-2 infection  and should not be used as the sole basis for treatment or other  patient management decisions.  A negative result may occur with  improper specimen collection / handling, submission of specimen other  than nasopharyngeal swab, presence of viral mutation(s) within the  areas targeted by this assay, and inadequate number of viral copies  (<250 copies / mL). A negative result must be combined with clinical  observations, patient history, and epidemiological information. If result is POSITIVE SARS-CoV-2 target nucleic acids are DETECTED. The SARS-CoV-2 RNA is generally detectable in upper and lower  respiratory specimens dur ing the acute phase of infection.  Positive  results are indicative of active infection with SARS-CoV-2.  Clinical  correlation with patient history and other diagnostic information is  necessary to determine patient infection status.  Positive results do  not rule out bacterial infection or co-infection with other viruses. If result is PRESUMPTIVE POSTIVE SARS-CoV-2 nucleic acids MAY BE  PRESENT.   A presumptive positive result was obtained on the submitted specimen  and confirmed on repeat testing.  While 2019 novel coronavirus  (SARS-CoV-2) nucleic acids may be present in the submitted sample  additional confirmatory testing may be necessary for epidemiological  and / or clinical management purposes  to differentiate between  SARS-CoV-2 and other Sarbecovirus currently known to infect humans.  If clinically indicated additional testing with an alternate test  methodology [phone]([id number]) is advised. The SARS-CoV-2 RNA is generally  detectable in upper and lower respiratory sp ecimens during the acute  phase of infection. The expected result is Negative. Fact Sheet for Patients:  BoilerBrush.com.cyhttps://www.fda.gov/media/136312/download Fact Sheet for Healthcare Providers: https://pope.com/https://www.fda.gov/media/136313/download This test is not yet approved or cleared by the [place] FDA and has been authorized for detection and/or diagnosis of SARS-CoV-2 by FDA under an Emergency Use Authorization (EUA).  This EUA will remain in effect (meaning this test can be used) for the duration of the COVID-19 declaration under Section 564(b)(1) of the Act, 21 U.S.C. section 360bbb-3(b)(1), unless the authorization is terminated or revoked sooner. Performed at [facility], [address] [place], [place] [address]   Culture, blood (routine x 2)     Status: Abnormal (Preliminary result)   Collection Time: 11/28/18  6:29 PM  Result Value Ref Range Status   Specimen Description BLOOD RIGHT ARM  Final   Special Requests   Final    BOTTLES DRAWN AEROBIC AND ANAEROBIC Blood Culture adequate volume   Culture  Setup Time   Final    GRAM POSITIVE COCCI IN BOTH AEROBIC AND ANAEROBIC BOTTLES CRITICAL RESULT CALLED TO, READ BACK BY AND VERIFIED WITHLelon Huh: L CURRAN [facility] [id number] 11/29/18 A BROWNING Performed at [facility], [address] [place], [place] [address]    Culture STAPHYLOCOCCUS AUREUS (A)   Final   Report Status PENDING  Incomplete  Blood Culture ID Panel (Reflexed)     Status: Abnormal   Collection Time: 11/28/18  6:29 PM  Result Value Ref Range Status   Enterococcus species NOT DETECTED NOT DETECTED Final   Listeria monocytogenes NOT DETECTED NOT DETECTED Final   Staphylococcus species DETECTED (A) NOT DETECTED Final    Comment: CRITICAL RESULT CALLED TO, READ BACK BY AND VERIFIED WITH: L CURRAN PHARMD  1612 11/29/18 A BROWNING    Staphylococcus aureus (BCID) DETECTED (A) NOT DETECTED Final    Comment: Methicillin (oxacillin)-resistant Staphylococcus aureus (MRSA). MRSA is predictably resistant to beta-lactam antibiotics (except ceftaroline). Preferred therapy is vancomycin unless clinically contraindicated. Patient requires contact precautions if  hospitalized. CRITICAL RESULT CALLED TO, READ BACK BY AND VERIFIED WITH: L CURRAN PHARMD 1612 11/29/18 A BROWNING    Methicillin resistance DETECTED (A) NOT DETECTED Final    Comment: CRITICAL RESULT CALLED TO, READ BACK BY AND VERIFIED WITH: L CURRAN PHARMD 1612 11/29/18 A BROWNING    Streptococcus species NOT DETECTED NOT DETECTED Final   Streptococcus agalactiae NOT DETECTED NOT DETECTED Final   Streptococcus pneumoniae NOT DETECTED NOT DETECTED Final   Streptococcus pyogenes NOT DETECTED NOT DETECTED Final   Acinetobacter baumannii NOT DETECTED NOT DETECTED Final   Enterobacteriaceae species NOT DETECTED NOT DETECTED Final   Enterobacter cloacae complex NOT DETECTED NOT DETECTED Final   Escherichia coli NOT DETECTED NOT DETECTED Final   Klebsiella oxytoca NOT DETECTED NOT DETECTED Final   Klebsiella pneumoniae NOT DETECTED NOT DETECTED Final   Proteus species NOT DETECTED NOT DETECTED Final   Serratia marcescens NOT DETECTED NOT DETECTED Final   Haemophilus influenzae NOT DETECTED NOT DETECTED Final   Neisseria meningitidis NOT DETECTED NOT DETECTED Final   Pseudomonas aeruginosa NOT DETECTED NOT DETECTED Final   Candida  albicans NOT DETECTED NOT DETECTED Final   Candida glabrata NOT DETECTED NOT DETECTED Final   Candida krusei NOT DETECTED NOT DETECTED Final   Candida parapsilosis NOT DETECTED NOT DETECTED Final   Candida tropicalis NOT DETECTED NOT DETECTED Final    Comment: Performed at [facility], [address] [place], [place] [address]  Culture, blood (routine x 2)     Status: Abnormal (Preliminary result)   Collection Time: 11/28/18  7:29 PM  Result Value Ref Range Status   Specimen Description BLOOD LEFT ARM  Final   Special Requests   Final    BOTTLES DRAWN AEROBIC AND ANAEROBIC Blood Culture adequate volume   Culture  Setup Time   Final    GRAM POSITIVE COCCI IN BOTH AEROBIC AND ANAEROBIC BOTTLES CRITICAL VALUE NOTED.  VALUE IS CONSISTENT WITH PREVIOUSLY REPORTED AND CALLED VALUE. Performed at [facility], [address] [place], [place] [address]    Culture STAPHYLOCOCCUS AUREUS (A)  Final   Report Status PENDING  Incomplete  Culture, Urine     Status: None   Collection Time: 11/29/18 12:58 PM  Result Value Ref Range Status   Specimen Description URINE, CATHETERIZED  Final   Special Requests NONE  Final   Culture   Final    NO GROWTH Performed at [facility], [address], [place], [place] [address]    Report Status 11/30/2018 FINAL  Final    Lanelle Bal, MD [facility] for Infectious Disease [facility] [phone] pager   [phone] cell 11/30/2018, 12:39 PM

## 2018-11-30 NOTE — Progress Notes (Signed)
Inpatient Diabetes Program Recommendations  AACE/ADA: New Consensus Statement on Inpatient Glycemic Control (2015)  Target Ranges:  Prepandial:   less than 140 mg/dL      Peak postprandial:   less than 180 mg/dL (1-2 hours)      Critically ill patients:  140 - 180 mg/dL   Lab Results  Component Value Date   GLUCAP 129 (H) 11/30/2018   HGBA1C 7.9 (H) 11/28/2018    Review of Glycemic Control Results for Yolanda, [name] (MRN [id number]) as of 11/30/2018 11:38  Ref. Range 11/29/2018 22:16 11/29/2018 23:25 11/30/2018 08:20 11/30/2018 09:39  Glucose-Capillary Latest Ref Range: 70 - 99 mg/dL 70 350 (H) 62 (L) 093 (H)   Diabetes history: DM 2 Outpatient Diabetes medications: Humalog 30 units bid, Tresiba 70 units bid Current orders for Inpatient glycemic control:  Novolog sensitive tid with meals and HS Novolog 5 units tid with meals Lantus 30 units bid Inpatient Diabetes Program Recommendations:     Noted hypoglycemic this AM of 62 mg/dL in agreement with decreasing correction.  In procedure this AM. Want to ensure patient receives AM dose of Lantus following.   Thanks, Lujean Rave, MSN, RNC-OB Diabetes Coordinator [phone] (8a-5p)

## 2018-12-01 LAB — CBC
HCT: 22.8 % — ABNORMAL LOW (ref 36.0–46.0)
Hemoglobin: 7.6 g/dL — ABNORMAL LOW (ref 12.0–15.0)
MCH: 30.3 pg (ref 26.0–34.0)
MCHC: 33.3 g/dL (ref 30.0–36.0)
MCV: 90.8 fL (ref 80.0–100.0)
Platelets: 334 10*3/uL (ref 150–400)
RBC: 2.51 MIL/uL — ABNORMAL LOW (ref 3.87–5.11)
RDW: 13.4 % (ref 11.5–15.5)
WBC: 25.3 10*3/uL — ABNORMAL HIGH (ref 4.0–10.5)
nRBC: 0 % (ref 0.0–0.2)

## 2018-12-01 LAB — BASIC METABOLIC PANEL
Anion gap: 5 (ref 5–15)
BUN: 9 mg/dL (ref 6–20)
CO2: 21 mmol/L — ABNORMAL LOW (ref 22–32)
Calcium: 8.3 mg/dL — ABNORMAL LOW (ref 8.9–10.3)
Chloride: 107 mmol/L (ref 98–111)
Creatinine, Ser: 0.76 mg/dL (ref 0.44–1.00)
GFR calc Af Amer: >60 mL/min (ref >60)
GFR calc non Af Amer: >60 mL/min (ref >60)
Glucose, Bld: 341 mg/dL — ABNORMAL HIGH (ref 70–99)
Potassium: 3.8 mmol/L (ref 3.5–5.1)
Sodium: 133 mmol/L — ABNORMAL LOW (ref 135–145)

## 2018-12-01 LAB — GLUCOSE, CAPILLARY
Glucose-Capillary: 278 mg/dL — ABNORMAL HIGH (ref 70–99)
Glucose-Capillary: 235 mg/dL — ABNORMAL HIGH (ref 70–99)
Glucose-Capillary: 227 mg/dL — ABNORMAL HIGH (ref 70–99)
Glucose-Capillary: 134 mg/dL — ABNORMAL HIGH (ref 70–99)

## 2018-12-01 LAB — CULTURE, BLOOD (ROUTINE X 2)
Special Requests: ADEQUATE
Special Requests: ADEQUATE

## 2018-12-01 MED ORDER — VANCOMYCIN HCL 10 G IV SOLR
1250.0000 mg | Freq: Two times a day (BID) | INTRAVENOUS | Status: DC
  Administered 2018-12-01 – 2018-12-05 (×9): 1250 mg via INTRAVENOUS
  Filled 2018-12-01 (×10): qty 1250

## 2018-12-01 MED ORDER — INSULIN ASPART 100 UNIT/ML ~~LOC~~ SOLN
6.0000 [IU] | Freq: Three times a day (TID) | SUBCUTANEOUS | Status: DC
  Administered 2018-12-01 – 2018-12-08 (×15): 6 [IU] via SUBCUTANEOUS

## 2018-12-01 MED ORDER — INSULIN GLARGINE 100 UNIT/ML ~~LOC~~ SOLN
35.0000 [IU] | Freq: Two times a day (BID) | SUBCUTANEOUS | Status: DC
  Administered 2018-12-01 – 2018-12-03 (×4): 35 [IU] via SUBCUTANEOUS
  Filled 2018-12-01 (×5): qty 0.35

## 2018-12-01 MED ORDER — INSULIN ASPART 100 UNIT/ML ~~LOC~~ SOLN
0.0000 [IU] | Freq: Three times a day (TID) | SUBCUTANEOUS | Status: DC
  Administered 2018-12-01 – 2018-12-02 (×2): 5 [IU] via SUBCUTANEOUS
  Administered 2018-12-02 – 2018-12-03 (×3): 3 [IU] via SUBCUTANEOUS
  Administered 2018-12-04: 2 [IU] via SUBCUTANEOUS
  Administered 2018-12-05: 5 [IU] via SUBCUTANEOUS
  Administered 2018-12-06: 15 [IU] via SUBCUTANEOUS
  Administered 2018-12-06: 5 [IU] via SUBCUTANEOUS
  Administered 2018-12-06: 3 [IU] via SUBCUTANEOUS
  Administered 2018-12-07 – 2018-12-08 (×2): 15 [IU] via SUBCUTANEOUS
  Administered 2018-12-08: 8 [IU] via SUBCUTANEOUS
  Administered 2018-12-08: 15 [IU] via SUBCUTANEOUS
  Administered 2018-12-09: 3 [IU] via SUBCUTANEOUS
  Administered 2018-12-09: 5 [IU] via SUBCUTANEOUS
  Administered 2018-12-09: 3 [IU] via SUBCUTANEOUS
  Administered 2018-12-10: 2 [IU] via SUBCUTANEOUS
  Administered 2018-12-10: 5 [IU] via SUBCUTANEOUS
  Administered 2018-12-10: 3 [IU] via SUBCUTANEOUS
  Administered 2018-12-11: 8 [IU] via SUBCUTANEOUS
  Administered 2018-12-11: 3 [IU] via SUBCUTANEOUS
  Administered 2018-12-12: 2 [IU] via SUBCUTANEOUS
  Administered 2018-12-12 – 2018-12-13 (×2): 3 [IU] via SUBCUTANEOUS
  Administered 2018-12-13: 18:00:00 5 [IU] via SUBCUTANEOUS
  Administered 2018-12-13: 14:00:00 3 [IU] via SUBCUTANEOUS
  Administered 2018-12-14: 11 [IU] via SUBCUTANEOUS
  Administered 2018-12-14: 2 [IU] via SUBCUTANEOUS
  Administered 2018-12-14: 14:00:00 3 [IU] via SUBCUTANEOUS
  Administered 2018-12-15: 5 [IU] via SUBCUTANEOUS
  Administered 2018-12-15: 3 [IU] via SUBCUTANEOUS
  Administered 2018-12-16: 09:00:00 8 [IU] via SUBCUTANEOUS
  Administered 2018-12-16: 3 [IU] via SUBCUTANEOUS
  Administered 2018-12-16: 5 [IU] via SUBCUTANEOUS
  Administered 2018-12-17: 18:00:00 8 [IU] via SUBCUTANEOUS
  Administered 2018-12-17: 5 [IU] via SUBCUTANEOUS
  Administered 2018-12-17: 8 [IU] via SUBCUTANEOUS
  Administered 2018-12-18: 3 [IU] via SUBCUTANEOUS
  Administered 2018-12-18: 09:00:00 2 [IU] via SUBCUTANEOUS
  Administered 2018-12-18 – 2018-12-20 (×5): 3 [IU] via SUBCUTANEOUS
  Administered 2018-12-20: 8 [IU] via SUBCUTANEOUS
  Administered 2018-12-20: 13:00:00 15 [IU] via SUBCUTANEOUS
  Administered 2018-12-21: 13:00:00 5 [IU] via SUBCUTANEOUS
  Administered 2018-12-21: 10:00:00 8 [IU] via SUBCUTANEOUS
  Administered 2018-12-21: 18:00:00 15 [IU] via SUBCUTANEOUS
  Administered 2018-12-22: 5 [IU] via SUBCUTANEOUS
  Administered 2018-12-22: 2 [IU] via SUBCUTANEOUS
  Administered 2018-12-22: 8 [IU] via SUBCUTANEOUS
  Administered 2018-12-23: 11 [IU] via SUBCUTANEOUS
  Administered 2018-12-23: 15 [IU] via SUBCUTANEOUS
  Administered 2018-12-24: 13:00:00 8 [IU] via SUBCUTANEOUS
  Administered 2018-12-24 – 2018-12-25 (×3): 3 [IU] via SUBCUTANEOUS

## 2018-12-01 NOTE — Social Work (Signed)
CSW acknowledging consult for SNF placement. Will follow for therapy recommendations.   Beverley Sherrard, MSW, LCSWA [place] Clinical Social Work [phone]   

## 2018-12-01 NOTE — Plan of Care (Signed)

## 2018-12-01 NOTE — Anesthesia Postprocedure Evaluation (Signed)
Anesthesia Post Note  Patient: Yolanda [name]  Procedure(s) Performed: RIGHT BELOW KNEE AMPUTATION (Right ) Application Of Wound Vac (Right Leg Lower)     Patient location during evaluation: PACU Anesthesia Type: General Level of consciousness: awake and alert Pain management: pain level controlled Vital Signs Assessment: post-procedure vital signs reviewed and stable Respiratory status: spontaneous breathing, nonlabored ventilation, respiratory function stable and patient connected to nasal cannula oxygen Cardiovascular status: blood pressure returned to baseline and stable Postop Assessment: no apparent nausea or vomiting Anesthetic complications: no    Last Vitals:  Vitals:   12/01/18 0540 12/01/18 1153  BP: 123/88   Pulse:    Resp: 12   Temp: 36.4 C 36.5 C  SpO2:      Last Pain:  Vitals:   12/01/18 0830  TempSrc:   PainSc: 0-No pain                 [name]

## 2018-12-01 NOTE — Progress Notes (Signed)
Inpatient Diabetes Program Recommendations  AACE/ADA: New Consensus Statement on Inpatient Glycemic Control (2015)  Target Ranges:  Prepandial:   less than 140 mg/dL      Peak postprandial:   less than 180 mg/dL (1-2 hours)      Critically ill patients:  140 - 180 mg/dL   Lab Results  Component Value Date   GLUCAP 235 (H) 12/01/2018   HGBA1C 7.9 (H) 11/28/2018    Review of Glycemic Control Results for [name], [name] (MRN [id number]) as of 11/30/2018 11:38  Ref. Range 11/29/2018 22:16 11/29/2018 23:25 11/30/2018 08:20 11/30/2018 09:39  Glucose-Capillary Latest Ref Range: 70 - 99 mg/dL 70 734 (H) 62 (L) 287 (H)  Results for Yolanda [name], Yolanda [name] (MRN [id number]) as of 12/01/2018 15:40  Ref. Range 11/30/2018 16:31 11/30/2018 21:10 12/01/2018 09:18 12/01/2018 12:32  Glucose-Capillary Latest Ref Range: 70 - 99 mg/dL 035 (H) 597 (H) 416 (H) 235 (H)   Diabetes history: DM 2 Outpatient Diabetes medications: Humalog 30 units bid, Tresiba 70 units bid Current orders for Inpatient glycemic control:  Novolog sensitive tid with meals and HS Novolog 5 units tid with meals Lantus 30 units bid  Inpatient Diabetes Program Recommendations:     Decadron 4 mg given yest. Lantus increased to 35 units bid, Novolog Correction increased to 0-15 units, Novolog meal coverage 6 units. Agree with current changes.   Glucose trends should start to decrease within the next 24-48 hours, anticipate need for Insulin titration as glucose trends were lower prior to decadron dose.  Will monitor glucose trends.  Thanks,  Christena Deem RN, MSN, BC-ADM Inpatient Diabetes Coordinator Team Pager [phone] (8a-5p)

## 2018-12-01 NOTE — Evaluation (Signed)
Physical Therapy Evaluation Patient Details Name: Yolanda [name] MRN: [id number] DOB: 09-14-1975 Today's Date: 12/01/2018   History of Present Illness  Pt is a 43 y.o. F with significant PMH of diabetes type 2, diabetic foot ulcer, who presents with sepsis with right foot infection, osteomyelitis and MRSA bacteremia and presumed endocarditis. 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  Clinical Impression  Pt admitted with above diagnosis. Pt currently with functional limitations due to the deficits listed below (see PT Problem List). Prior to admission, pt independent with mobility using a walker. On PT evaluation, pt performing bed mobility with moderate assistance. Unable to stand or perform low pivot today. Interestingly, pt with left lower extremity pain with attempts to bear weight. Practiced lateral scooting in bed for pre transfer training. Education re: prosthetic expectations and mobility progression. Given PLOF and age, recommending CIR to maximize functional independence and provide amputee education.      Follow Up Recommendations CIR    Equipment Recommendations  Wheelchair (measurements PT)    Recommendations for Other Services       Precautions / Restrictions Precautions Precautions: Fall Precaution Comments: R BKA Required Braces or Orthoses: Other Brace Other Brace: R limb guard Restrictions Weight Bearing Restrictions: Yes RLE Weight Bearing: Non weight bearing      Mobility  Bed Mobility Overal bed mobility: Needs Assistance Bed Mobility: Supine to Sit;Sit to Supine;Rolling Rolling: Supervision   Supine to sit: Mod assist Sit to supine: Mod assist   General bed mobility comments: Pt able to progress to long sitting position, modA for BLE negotiation off of bed and use of bed pad. Rolling to left with supervision for peri care and placing bed pan   Transfers                 General transfer comment: Unable  Ambulation/Gait                 Stairs            Wheelchair Mobility    Modified Rankin (Stroke Patients Only)       Balance Overall balance assessment: Needs assistance Sitting-balance support: Bilateral upper extremity supported Sitting balance-Leahy Scale: Good                                       Pertinent Vitals/Pain Pain Assessment: Faces Faces Pain Scale: Hurts even more Pain Location: left leg with attempts to stand Pain Descriptors / Indicators: Grimacing Pain Intervention(s): Monitored during session    Home Living Family/patient expects to be discharged to:: Private residence Living Arrangements: Parent Available Help at Discharge: Family Type of Home: House Home Access: Stairs to enter Entrance Stairs-Rails: None Entrance Stairs-Number of Steps: 1 Home Layout: Able to live on main level with bedroom/bathroom Home Equipment: Environmental consultantWalker - 2 wheels;Cane - quad      Prior Function Level of Independence: Independent with assistive device(s)         Comments: using walker      Hand Dominance   Dominant Hand: Right    Extremity/Trunk Assessment   Upper Extremity Assessment Upper Extremity Assessment: Generalized weakness;LUE deficits/detail LUE Deficits / Details: increased edema    Lower Extremity Assessment Lower Extremity Assessment: RLE deficits/detail;LLE deficits/detail RLE Deficits / Details: s/p BKA. Able to perform quad set and SLR LLE Deficits / Details: MMT: hip flexion 5/5, knee extension 5/5, ankle dorsiflexion 3/5  Communication   Communication: No difficulties  Cognition Arousal/Alertness: Awake/alert Behavior During Therapy: WFL for tasks assessed/performed Overall Cognitive Status: Impaired/Different from baseline Area of Impairment: Problem solving                             Problem Solving: Slow processing;Requires verbal cues        General Comments      Exercises     Assessment/Plan    PT Assessment  Patient needs continued PT services  PT Problem List Decreased strength;Decreased activity tolerance;Decreased balance;Decreased mobility;Pain       PT Treatment Interventions DME instruction;Gait training;Functional mobility training;Therapeutic activities;Therapeutic exercise;Balance training;Patient/family education;Wheelchair mobility training    PT Goals (Current goals can be found in the Care Plan section)  Acute Rehab PT Goals Patient Stated Goal: "get a prosthetic." PT Goal Formulation: With patient Time For Goal Achievement: 12/15/18 Potential to Achieve Goals: Good    Frequency Min 3X/week   Barriers to discharge        Co-evaluation               AM-PAC PT "6 Clicks" Mobility  Outcome Measure Help needed turning from your back to your side while in a flat bed without using bedrails?: None Help needed moving from lying on your back to sitting on the side of a flat bed without using bedrails?: A Lot Help needed moving to and from a bed to a chair (including a wheelchair)?: Total Help needed standing up from a chair using your arms (e.g., wheelchair or bedside chair)?: Total Help needed to walk in hospital room?: Total Help needed climbing 3-5 steps with a railing? : Total 6 Click Score: 10    End of Session   Activity Tolerance: Patient limited by pain Patient left: in bed;with call bell/phone within reach;Other (comment)(on bedpan; RN notified) Nurse Communication: Other (comment)(pt on bedpan) PT Visit Diagnosis: Other abnormalities of gait and mobility (R26.89);Pain Pain - Right/Left: Left Pain - part of body: Leg    Time: 8889-1694 PT Time Calculation (min) (ACUTE ONLY): 25 min   Charges:   PT Evaluation $PT Eval Moderate Complexity: 1 Mod PT Treatments $Therapeutic Activity: 8-22 mins        Laurina Bustle, PT, DPT Acute Rehabilitation Services Pager [phone] Office [phone]   Vanetta Mulders 12/01/2018, 2:37 PM

## 2018-12-01 NOTE — Plan of Care (Signed)

## 2018-12-01 NOTE — Progress Notes (Signed)
Subjective: 1 Day Post-Op Procedure(s) (LRB): RIGHT BELOW KNEE AMPUTATION (Right) Application Of Wound Vac (Right) Patient reports pain as mild and moderate.    Objective: Vital signs in last 24 hours: Temp:  [97.6 F (36.4 C)-98.5 F (36.9 C)] 97.6 F (36.4 C) (05/23 0540) Pulse Rate:  [106-112] 112 (05/22 1825) Resp:  [12-35] 12 (05/23 0540) BP: (116-134)/(79-88) 123/88 (05/23 0540) SpO2:  [92 %-100 %] 99 % (05/22 1825) Weight:  [83.6 kg] 83.6 kg (05/23 0540)  Intake/Output from previous day: 05/22 0701 - 05/23 0700 In: 1400 [P.O.:440; I.V.:710; IV Piggyback:250] Out: 975 [Urine:900; Blood:75] Intake/Output this shift: No intake/output data recorded.  Recent Labs    11/28/18 1912 11/29/18 0328 11/30/18 0319 12/01/18 0301  HGB 9.5* 8.9* 8.2* 7.6*   Recent Labs    11/30/18 0319 12/01/18 0301  WBC 21.8* 25.3*  RBC 2.68* 2.51*  HCT 24.2* 22.8*  PLT 356 334   Recent Labs    11/30/18 0319 12/01/18 0301  NA 137 133*  K 3.4* 3.8  CL 107 107  CO2 23 21*  BUN 12 9  CREATININE 0.91 0.76  GLUCOSE 93 341*  CALCIUM 9.2 8.3*   No results for input(s): LABPT, INR in the last 72 hours.  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 No drainage in VAC canister. Shrinker and limb protector in place.    Assessment/Plan: 1 Day Post-Op Procedure(s) (LRB): RIGHT BELOW KNEE AMPUTATION (Right) Application Of Wound Vac (Right) Continue VAC dressing.  Up with therapy and CIR consulted.  Continue cefepime and Flagyl x 3 days post op.  Continue Vancomycin for MRSA bacteremia   Lazaro Arms, PA-C 12/01/2018, 8:15 AM  [facility] [phone]

## 2018-12-01 NOTE — Progress Notes (Signed)
Pharmacy Antibiotic Note  Yolanda [name] is a 43 y.o. female admitted on 11/28/2018 with diabetic foot infection.  Pharmacy has been consulted for vancomycin dosing.  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 Per MD note, she failed daptomycin and was transitioned to vancomycin (which was supposed to complete on 11/20/2018). 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 Per pt report, she had Rx for ciprofloxacin 500 mg po BID X 10 days, starting on 11/21/18.  Renal function has been improving throughout hospitalization, SCr today 0.76 (est CrCl ~ 100 mL/min)   Plan: Increase vancomycin to 1250 mg IV q12h with improvement in renal function. Goal AUC 400-550. Expected AUC: 523.6 SCr used: 0.76 Monitor renal function, LOT, vancomycin levels PRN  Cefepime 2 gm Q 8 hours through 5/26 per MD Metronidazole 500 mg Q 8 hours through 5/26 per MD F/u TEE planned for 5/26    Height: 5\' 6"  (167.6 cm) Weight: 184 lb 4.9 oz (83.6 kg) IBW/kg (Calculated) : 59.3  Temp (24hrs), Avg:98 F (36.7 C), Min:97.6 F (36.4 C), Max:98.5 F (36.9 C)  Recent Labs  Lab 11/28/18 1912 11/29/18 0328 11/30/18 0319 12/01/18 0301  WBC 21.3* 20.2* 21.8* 25.3*  CREATININE 0.89 0.97 0.91 0.76  LATICACIDVEN 1.4  --   --   --   VANCORANDOM 10  --   --   --     Estimated Creatinine Clearance: 99.8 mL/min (by C-G formula based on SCr of 0.76 mg/dL).    No Known Allergies  Antimicrobials this admission: Cefepime 5/20>>5/26 Vancomycin 5/20>> Flagyl 5/20>>5/26  Dose adjustments this admission: 5/23: vancomycin increased from 1750 mg IV q24h to 1250 mg IV q12h  Microbiology results: 5/20: Blood Cxs: MRSA 5/20: MRSA PCR: positive 5/20: COVID: negative 5/21 UCx: ngF 5/22 BCx: pending  Thank you for allowing pharmacy to be a part of this patient's  care.  Danae Orleans, PharmD PGY1 Pharmacy Resident Phone [phone] 12/01/2018       1:07 PM

## 2018-12-01 NOTE — Progress Notes (Signed)
[facility]                                                                              Patient Demographics  Yolanda [name], is a 43 y.o. female, DOB - 01-Jan-1976, WUJ:[id number]  Admit date - 11/28/2018   Admitting Physician Vianne Bulls, MD  Outpatient Primary MD for the patient is System, Pcp Not In  Outpatient specialists:   LOS - 3  days   Medical records reviewed and are as summarized below:    No chief complaint on file.      Brief summary   Patient is a 43 year old female with diabetes type 2, diabetic foot ulcer, osteomyelitis and cellulitis with MRSA bacteremia, presumed endocarditis presented with right lower extremity pain, swelling of her right lower extremity.  She was recently discharged in April for MRSA bacteremia with presumed endocarditis, failed daptomycin and was transitioned to vancomycin (supposed to complete on 11/20/2018).  Patient was seen at [facility] rocking him, CT findings concerning for destruction of the entire mid and hindfoot with fluid and gas collections, surrounding cellulitis. 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  Blood cultures negative so far -Per ID, okay to discontinue cefepime and Flagyl on 5/26.    Diabetes mellitus type 2,  uncontrolled, with complications, foot infection ([place]) -CBGs now elevated, not on D5 drip anymore -Increase Lantus to 35 units twice daily, increase meal coverage to 6 units 3 times daily AC, Change sliding scale insulin to moderate  Essential hypertension BP currently soft, continue to hold antihypertensives  Right lower extremity DVT -Diagnosed in 07/2018, used to be on Xarelto, she completed the starter pack however Xarelto was discontinued due to bleeding issues at [facility] in 09/2018.  Right lower extremity Dopplers on 3/29 did not show any DVT hence Xarelto was not resumed.  Hypercalcemia -Likely due to sepsis, dehydration, calcium 10.5 with a time of admission -Resolved  Severe protein calorie malnutrition -Albumin 1.2 -Placed on pro-stat, nutritional supplements  Acute on chronic anemia  -H&H currently stable baseline 9-10 -Hemoglobin 7.4 at OSH, transfused 1 unit packed RBC,  -Hemoglobin currently 7.6, postop, follow CBC closely  Urinary retention Planing of being unable to urinate, bladder scan showed 415 cc. -UA negative for UTI  - Flomax for 7 days -Foley catheter if urinary retention continues   Code Status: Full code DVT Prophylaxis: SCDs, will place on heparin subcu after surgery Family Communication: Discussed in detail with the patient, all imaging results, lab results explained to the patient    Disposition Plan: When cleared by  ID and Ortho  Time Spent in minutes 25 minutes  Procedures:  Transtibial amputation 5/22  Consultants:   Orthopedics, Dr. Sharol Given  Antimicrobials:   Anti-infectives (From admission, onward)   Start     Dose/Rate Route Frequency Ordered Stop   12/01/18 1330  vancomycin (VANCOCIN) 1,250 mg in sodium chloride 0.9 % 250 mL IVPB     1,250 mg 166.7 mL/hr over 90 Minutes Intravenous Every 12 hours 12/01/18 1310     11/30/18 1500  ceFEPIme (MAXIPIME) 2 g in sodium chloride 0.9 % 100 mL IVPB     2 g 200 mL/hr over 30  Minutes Intravenous Every 8 hours 11/30/18 1416     11/30/18 1500  metroNIDAZOLE (FLAGYL) tablet 500 mg     500 mg Oral Every 8 hours 11/30/18 1416     11/30/18 1415  metroNIDAZOLE (FLAGYL) IVPB 500 mg  Status:  Discontinued     500 mg 100 mL/hr over 60 Minutes Intravenous Every 8 hours 11/30/18 1411 11/30/18 1416   11/28/18 2045  vancomycin (VANCOCIN) 1,750 mg in sodium chloride 0.9 % 500 mL IVPB  Status:  Discontinued     1,750 mg 250 mL/hr over 120 Minutes Intravenous Every 24 hours 11/28/18 2032 12/01/18 1310   11/28/18 2030  vancomycin (VANCOCIN) 1,250 mg in sodium chloride 0.9 % 250 mL IVPB  Status:  Discontinued     1,250 mg 166.7 mL/hr over 90 Minutes Intravenous Every 24 hours 11/28/18 2004 11/28/18 2032   11/28/18 1900  ceFEPIme (MAXIPIME) 2 g in sodium chloride 0.9 % 100 mL IVPB  Status:  Discontinued     2 g 200 mL/hr over 30 Minutes Intravenous Every 8 hours 11/28/18 1848 11/30/18 1304   11/28/18 1815  metroNIDAZOLE (FLAGYL) tablet 500 mg  Status:  Discontinued     500 mg Oral Every 8 hours 11/28/18 1803 11/30/18 1304         Medications  Scheduled Meds: . Chlorhexidine Gluconate Cloth  6 each Topical Q0600  . docusate sodium  100 mg Oral BID  . insulin aspart  0-5 Units Subcutaneous QHS  . insulin aspart  0-9 Units Subcutaneous TID WC  . insulin aspart  5 Units Subcutaneous TID WC  . insulin glargine  30 Units Subcutaneous BID  . metroNIDAZOLE  500 mg Oral Q8H  . multivitamin with minerals  1 tablet Oral Daily  . mupirocin ointment  1 application Nasal BID  . Ensure Max Protein  11 oz Oral Daily  . tamsulosin  0.4 mg Oral QPC breakfast   Continuous Infusions: . sodium chloride 250 mL (11/30/18 0650)  . sodium chloride Stopped (11/30/18 1636)  . ceFEPime (MAXIPIME) IV 2 g (12/01/18 0949)  . dextrose 5 % and 0.45% NaCl    . lactated ringers Stopped (11/30/18 1330)  . methocarbamol (ROBAXIN) IV    . vancomycin     PRN Meds:.sodium chloride, acetaminophen  **OR** acetaminophen, bisacodyl, HYDROmorphone (DILAUDID) injection, magnesium citrate, methocarbamol **OR** methocarbamol (ROBAXIN) IV, metoCLOPramide **OR** metoCLOPramide (REGLAN) injection, ondansetron **OR** ondansetron (ZOFRAN) IV, oxyCODONE, oxyCODONE, polyethylene glycol      Subjective:   Yolanda [name] was seen and examined today.  No complaints by the patient, eating breakfast.  Pain controlled.  Postop day #1 today.  No acute issues overnight.  No fevers or chills.   Patient denies dizziness, chest pain, shortness of breath, abdominal pain, N/V/D/C.   Objective:   Vitals:   11/30/18 2248 12/01/18 0031 12/01/18 0540 12/01/18 1153  BP: 134/85  123/88  Pulse:      Resp: (!) 29  12   Temp:  98 F (36.7 C) 97.6 F (36.4 C) 97.7 F (36.5 C)  TempSrc:   Axillary   SpO2:      Weight:   83.6 kg   Height:        Intake/Output Summary (Last 24 hours) at 12/01/2018 1412 Last data filed at 12/01/2018 0830 Gross per 24 hour  Intake 611 ml  Output 400 ml  Net 211 ml     Wt Readings from Last 3 Encounters:  12/01/18 83.6 kg  11/08/18 77.1 kg  10/24/18 77.1 kg   Physical Exam  General: Alert and oriented x 3, NAD  Eyes:   HEENT:  Atraumatic, normocephalic  Cardiovascular: S1 S2 clear, RRR  Respiratory: CTAB, no wheezing, rales or rhonchi  Gastrointestinal: Soft, nontender, nondistended, NBS  Ext: Right BKA, postop  Neuro: no new deficits  Musculoskeletal: No cyanosis, clubbing  Skin: No rashes  Psych: Normal affect and demeanor, alert and oriented x3     Data Reviewed:  I have personally reviewed following labs and imaging studies  Micro Results Recent Results (from the past 240 hour(s))  MRSA PCR Screening     Status: Abnormal   Collection Time: 11/28/18  5:30 PM  Result Value Ref Range Status   MRSA by PCR POSITIVE (A) NEGATIVE Final    Comment:        The GeneXpert MRSA Assay (FDA approved for NASAL specimens only), is one component of a  comprehensive MRSA colonization surveillance program. It is not intended to diagnose MRSA infection nor to guide or monitor treatment for MRSA infections. RESULT CALLED TO, READ BACK BY AND VERIFIED WITHJefferson Fuel RN 11/28/18 1928 JDW Performed at [facility], [address] [place], [place] [address]   SARS Coronavirus 2 [facility] order, Performed in [facility] hospital lab)     Status: None   Collection Time: 11/28/18  6:29 PM  Result Value Ref Range Status   SARS Coronavirus 2 NEGATIVE NEGATIVE Final    Comment: (NOTE) If result is NEGATIVE SARS-CoV-2 target nucleic acids are NOT DETECTED. The SARS-CoV-2 RNA is generally detectable in upper and lower  respiratory specimens during the acute phase of infection. The lowest  concentration of SARS-CoV-2 viral copies this assay can detect is 250  copies / mL. A negative result does not preclude SARS-CoV-2 infection  and should not be used as the sole basis for treatment or other  patient management decisions.  A negative result may occur with  improper specimen collection / handling, submission of specimen other  than nasopharyngeal swab, presence of viral mutation(s) within the  areas targeted by this assay, and inadequate number of viral copies  (<250 copies / mL). A negative result must be combined with clinical  observations, patient history, and epidemiological information. If result is POSITIVE SARS-CoV-2 target nucleic acids are DETECTED. The SARS-CoV-2 RNA is generally detectable in upper and lower  respiratory specimens dur ing the acute phase of infection.  Positive  results are indicative of active infection with SARS-CoV-2.  Clinical  correlation with patient history and other diagnostic information is  necessary to determine patient infection status.  Positive results do  not rule out bacterial infection or co-infection with other viruses. If result is PRESUMPTIVE POSTIVE SARS-CoV-2 nucleic acids MAY BE  PRESENT.   A presumptive positive result was obtained on the submitted specimen  and confirmed on repeat testing.  While 2019 novel  coronavirus  (SARS-CoV-2) nucleic acids may be present in the submitted sample  additional confirmatory testing may be necessary for epidemiological  and / or clinical management purposes  to differentiate between  SARS-CoV-2 and other Sarbecovirus currently known to infect humans.  If clinically indicated additional testing with an alternate test  methodology [phone]) is advised. The SARS-CoV-2 RNA is generally  detectable in upper and lower respiratory sp ecimens during the acute  phase of infection. The expected result is Negative. Fact Sheet for Patients:  StrictlyIdeas.no Fact Sheet for Healthcare Providers: BankingDealers.co.za This test is not yet approved or cleared by the [place] FDA and has been authorized for detection and/or diagnosis of SARS-CoV-2 by FDA under an Emergency Use Authorization (EUA).  This EUA will remain in effect (meaning this test can be used) for the duration of the COVID-19 declaration under Section 564(b)(1) of the Act, 21 U.S.C. section 360bbb-3(b)(1), unless the authorization is terminated or revoked sooner. Performed at [facility], [place] [address], [place], [place] [address]   Culture, blood (routine x 2)     Status: Abnormal   Collection Time: 11/28/18  6:29 PM  Result Value Ref Range Status   Specimen Description BLOOD RIGHT ARM  Final   Special Requests   Final    BOTTLES DRAWN AEROBIC AND ANAEROBIC Blood Culture adequate volume   Culture  Setup Time   Final    GRAM POSITIVE COCCI IN BOTH AEROBIC AND ANAEROBIC BOTTLES CRITICAL RESULT CALLED TO, READ BACK BY AND VERIFIED WITHBronwen Betters [facility] 9244 11/29/18 A BROWNING Performed at [facility], [place] [address] [place], [place] [address]    Culture METHICILLIN RESISTANT STAPHYLOCOCCUS AUREUS (A)   Final   Report Status 12/01/2018 FINAL  Final   Organism ID, Bacteria METHICILLIN RESISTANT STAPHYLOCOCCUS AUREUS  Final      Susceptibility   Methicillin resistant staphylococcus aureus - MIC*    CIPROFLOXACIN >=8 RESISTANT Resistant     ERYTHROMYCIN >=8 RESISTANT Resistant     GENTAMICIN <=0.5 SENSITIVE Sensitive     OXACILLIN >=4 RESISTANT Resistant     TETRACYCLINE <=1 SENSITIVE Sensitive     VANCOMYCIN <=0.5 SENSITIVE Sensitive     TRIMETH/SULFA <=10 SENSITIVE Sensitive     CLINDAMYCIN >=8 RESISTANT Resistant     RIFAMPIN <=0.5 SENSITIVE Sensitive     Inducible Clindamycin NEGATIVE Sensitive     * METHICILLIN RESISTANT STAPHYLOCOCCUS AUREUS  Blood Culture ID Panel (Reflexed)     Status: Abnormal   Collection Time: 11/28/18  6:29 PM  Result Value Ref Range Status   Enterococcus species NOT DETECTED NOT DETECTED Final   Listeria monocytogenes NOT DETECTED NOT DETECTED Final   Staphylococcus species DETECTED (A) NOT DETECTED Final    Comment: CRITICAL RESULT CALLED TO, READ BACK BY AND VERIFIED WITH: Bronwen Betters PHARMD 1612 11/29/18 A BROWNING    Staphylococcus aureus (BCID) DETECTED (A) NOT DETECTED Final    Comment: Methicillin (oxacillin)-resistant Staphylococcus aureus (MRSA). MRSA is predictably resistant to beta-lactam antibiotics (except ceftaroline). Preferred therapy is vancomycin unless clinically contraindicated. Patient requires contact precautions if  hospitalized. CRITICAL RESULT CALLED TO, READ BACK BY AND VERIFIED WITH: L CURRAN PHARMD 8177 11/29/18 A BROWNING    Methicillin resistance DETECTED (A) NOT DETECTED Final    Comment: CRITICAL RESULT CALLED TO, READ BACK BY AND VERIFIED WITH: L CURRAN PHARMD 1612 11/29/18 A BROWNING    Streptococcus species NOT DETECTED NOT DETECTED Final   Streptococcus agalactiae NOT DETECTED NOT DETECTED Final  Streptococcus pneumoniae NOT DETECTED NOT DETECTED Final   Streptococcus pyogenes NOT DETECTED NOT DETECTED Final    Acinetobacter baumannii NOT DETECTED NOT DETECTED Final   Enterobacteriaceae species NOT DETECTED NOT DETECTED Final   Enterobacter cloacae complex NOT DETECTED NOT DETECTED Final   Escherichia coli NOT DETECTED NOT DETECTED Final   Klebsiella oxytoca NOT DETECTED NOT DETECTED Final   Klebsiella pneumoniae NOT DETECTED NOT DETECTED Final   Proteus species NOT DETECTED NOT DETECTED Final   Serratia marcescens NOT DETECTED NOT DETECTED Final   Haemophilus influenzae NOT DETECTED NOT DETECTED Final   Neisseria meningitidis NOT DETECTED NOT DETECTED Final   Pseudomonas aeruginosa NOT DETECTED NOT DETECTED Final   Candida albicans NOT DETECTED NOT DETECTED Final   Candida glabrata NOT DETECTED NOT DETECTED Final   Candida krusei NOT DETECTED NOT DETECTED Final   Candida parapsilosis NOT DETECTED NOT DETECTED Final   Candida tropicalis NOT DETECTED NOT DETECTED Final    Comment: Performed at [facility], [place] [address] [place], [place] [address]  Culture, blood (routine x 2)     Status: Abnormal   Collection Time: 11/28/18  7:29 PM  Result Value Ref Range Status   Specimen Description BLOOD LEFT ARM  Final   Special Requests   Final    BOTTLES DRAWN AEROBIC AND ANAEROBIC Blood Culture adequate volume   Culture  Setup Time   Final    GRAM POSITIVE COCCI IN BOTH AEROBIC AND ANAEROBIC BOTTLES CRITICAL VALUE NOTED.  VALUE IS CONSISTENT WITH PREVIOUSLY REPORTED AND CALLED VALUE.    Culture (A)  Final    STAPHYLOCOCCUS AUREUS SUSCEPTIBILITIES PERFORMED ON PREVIOUS CULTURE WITHIN THE LAST 5 DAYS. Performed at [facility], [place] [address], [place], [place] [address]    Report Status 12/01/2018 FINAL  Final  Culture, Urine     Status: None   Collection Time: 11/29/18 12:58 PM  Result Value Ref Range Status   Specimen Description URINE, CATHETERIZED  Final   Special Requests NONE  Final   Culture   Final    NO GROWTH Performed at [facility], [place] [address]  [place], [place] [address]    Report Status 11/30/2018 FINAL  Final  Culture, blood (routine x 2)     Status: None (Preliminary result)   Collection Time: 11/30/18  3:22 PM  Result Value Ref Range Status   Specimen Description BLOOD LEFT ANTECUBITAL  Final   Special Requests   Final    BOTTLES DRAWN AEROBIC ONLY Blood Culture adequate volume   Culture   Final    NO GROWTH < 24 HOURS Performed at [facility], [place] [address], [place], [place] [address]    Report Status PENDING  Incomplete  Culture, blood (routine x 2)     Status: None (Preliminary result)   Collection Time: 11/30/18  3:22 PM  Result Value Ref Range Status   Specimen Description BLOOD RIGHT HAND  Final   Special Requests   Final    BOTTLES DRAWN AEROBIC ONLY Blood Culture adequate volume   Culture   Final    NO GROWTH < 24 HOURS Performed at [facility], [place] [address], [place], [place] [address]    Report Status PENDING  Incomplete    Radiology Reports Vas [place] Abi With/wo Tbi  Result Date: 11/29/2018 LOWER EXTREMITY DOPPLER STUDY Indications: Diabetic foot infection. High Risk Factors: Diabetes.  Performing Technologist: Abram Sander RVS  Examination Guidelines: A complete evaluation  includes at minimum, Doppler waveform signals and systolic blood pressure reading at the level of bilateral brachial, anterior tibial, and posterior tibial arteries, when vessel segments are accessible. Bilateral testing is considered an integral part of a complete examination. Photoelectric Plethysmograph (PPG) waveforms and toe systolic pressure readings are included as required and additional duplex testing as needed. Limited examinations for reoccurring indications may be performed as noted.  ABI Findings: +--------+------------------+-----+---------+--------+ Right   Rt Pressure (mmHg)IndexWaveform Comment  +--------+------------------+-----+---------+--------+ [id number]                    triphasic          +--------+------------------+-----+---------+--------+ PTA     151               1.14 biphasic          +--------+------------------+-----+---------+--------+ DP      160               1.20 biphasic          +--------+------------------+-----+---------+--------+ +--------+------------------+-----+---------+-------+ Left    Lt Pressure (mmHg)IndexWaveform Comment +--------+------------------+-----+---------+-------+ [id number]                    triphasic        +--------+------------------+-----+---------+-------+ PTA     147               1.11 triphasic        +--------+------------------+-----+---------+-------+ DP      148               1.11 triphasic        +--------+------------------+-----+---------+-------+ +-------+-----------+-----------+------------+------------+ ABI/TBIToday's ABIToday's TBIPrevious ABIPrevious TBI +-------+-----------+-----------+------------+------------+ Right  1.20                                           +-------+-----------+-----------+------------+------------+ Left   1.11                                           +-------+-----------+-----------+------------+------------+  Summary: Right: Resting right ankle-brachial index is within normal range. No evidence of significant right lower extremity arterial disease. Left: Resting left ankle-brachial index is within normal range. No evidence of significant left lower extremity arterial disease.  *See table(s) above for measurements and observations.  Electronically signed by Monica Martinez MD on 11/29/2018 at 5:28:23 PM.   Final    Xr Ankle Complete Right  Result Date: 11/08/2018 3 view radiographs of the right ankle shows a congruent mortise with no destructive changes of the fibula.  Patient has Charcot collapse through the talar neck with peripheral vascular disease and calcification of the arteries down to the ankle.   Lab Data:  CBC: Recent Labs  Lab 11/28/18  1912 11/29/18 0328 11/30/18 0319 12/01/18 0301  WBC 21.3* 20.2* 21.8* 25.3*  HGB 9.5* 8.9* 8.2* 7.6*  HCT 29.0* 26.4* 24.2* 22.8*  MCV 91.5 90.4 90.3 90.8  PLT 405* 363 356 790   Basic Metabolic Panel: Recent Labs  Lab 11/28/18 1912 11/29/18 0328 11/30/18 0319 12/01/18 0301  NA 134* 135 137 133*  K 4.1 4.0 3.4* 3.8  CL 103 106 107 107  CO2 _0 21*  GLUCOSE 232* 247* 93 341*  BUN _1 CREATININE 0.89 0.97 0.91 0.76  CALCIUM 10.5*  9.7  9.6 9.2 8.3*   GFR: Estimated Creatinine Clearance: 99.8 mL/min (by C-G formula based on SCr of 0.76 mg/dL). Liver Function Tests: Recent Labs  Lab 11/28/18 1912 11/29/18 0328  AST 33 32  ALT 22 20  ALKPHOS 287* 289*  BILITOT 3.0* 3.3*  PROT 8.6* 7.5  ALBUMIN 1.4* 1.2*   No results for input(s): LIPASE, AMYLASE in the last 168 hours. No results for input(s): AMMONIA in the last 168 hours. Coagulation Profile: No results for input(s): INR, PROTIME in the last 168 hours. Cardiac Enzymes: No results for input(s): CKTOTAL, CKMB, CKMBINDEX, TROPONINI in the last 168 hours. BNP (last 3 results) No results for input(s): PROBNP in the last 8760 hours. HbA1C: Recent Labs    11/28/18 1912  HGBA1C 7.9*   CBG: Recent Labs  Lab 11/30/18 0939 11/30/18 1631 11/30/18 2110 12/01/18 0918 12/01/18 1232  GLUCAP 129* 186* 356* 278* 235*   Lipid Profile: No results for input(s): CHOL, HDL, LDLCALC, TRIG, CHOLHDL, LDLDIRECT in the last 72 hours. Thyroid Function Tests: No results for input(s): TSH, T4TOTAL, FREET4, T3FREE, THYROIDAB in the last 72 hours. Anemia Panel: No results for input(s): VITAMINB12, FOLATE, FERRITIN, TIBC, IRON, RETICCTPCT in the last 72 hours. Urine analysis:    Component Value Date/Time   COLORURINE YELLOW 11/29/2018 [place] 11/29/2018 0653   LABSPEC 1.010 11/29/2018 0653   PHURINE 5.5 11/29/2018 0653   GLUCOSEU NEGATIVE 11/29/2018 0653   HGBUR TRACE (A) 11/29/2018 0653    BILIRUBINUR SMALL (A) 11/29/2018 0653   KETONESUR NEGATIVE 11/29/2018 0653   PROTEINUR NEGATIVE 11/29/2018 0653   NITRITE NEGATIVE 11/29/2018 0653   LEUKOCYTESUR NEGATIVE 11/29/2018 0653       M.D. [facility] 12/01/2018, 2:12 PM  Pager: [phone] Between 7am to 7pm - call Pager - [phone]  After 7pm go to [email or web address] - password [email or web address]  Call night coverage person covering after 7pm

## 2018-12-02 ENCOUNTER — Encounter (HOSPITAL_COMMUNITY): Payer: Self-pay | Admitting: Orthopedic Surgery

## 2018-12-02 LAB — CBC
HCT: 22.9 % — ABNORMAL LOW (ref 36.0–46.0)
Hemoglobin: 7.6 g/dL — ABNORMAL LOW (ref 12.0–15.0)
MCH: 30.4 pg (ref 26.0–34.0)
MCHC: 33.2 g/dL (ref 30.0–36.0)
MCV: 91.6 fL (ref 80.0–100.0)
Platelets: 346 10*3/uL (ref 150–400)
RBC: 2.5 MIL/uL — ABNORMAL LOW (ref 3.87–5.11)
RDW: 13.6 % (ref 11.5–15.5)
WBC: 21 10*3/uL — ABNORMAL HIGH (ref 4.0–10.5)
nRBC: 0.1 % (ref 0.0–0.2)

## 2018-12-02 LAB — GLUCOSE, CAPILLARY
Glucose-Capillary: 110 mg/dL — ABNORMAL HIGH (ref 70–99)
Glucose-Capillary: 185 mg/dL — ABNORMAL HIGH (ref 70–99)
Glucose-Capillary: 242 mg/dL — ABNORMAL HIGH (ref 70–99)
Glucose-Capillary: 105 mg/dL — ABNORMAL HIGH (ref 70–99)

## 2018-12-02 LAB — BASIC METABOLIC PANEL
Anion gap: 5 (ref 5–15)
BUN: 12 mg/dL (ref 6–20)
CO2: 24 mmol/L (ref 22–32)
Calcium: 7.9 mg/dL — ABNORMAL LOW (ref 8.9–10.3)
Chloride: 108 mmol/L (ref 98–111)
Creatinine, Ser: 0.65 mg/dL (ref 0.44–1.00)
GFR calc Af Amer: >60 mL/min (ref >60)
GFR calc non Af Amer: >60 mL/min (ref >60)
Glucose, Bld: 119 mg/dL — ABNORMAL HIGH (ref 70–99)
Potassium: 3.5 mmol/L (ref 3.5–5.1)
Sodium: 137 mmol/L (ref 135–145)

## 2018-12-02 NOTE — Progress Notes (Signed)
Low oxygenation status noted at 0030, oxygen status was 79%, respiration 21, P 99.  Patient assessed, patient asleep upon arrival, patient states that she was asleep.  Patient placed on O2 at 2L while asleep, oxygen status returned to 99%

## 2018-12-02 NOTE — Progress Notes (Addendum)
  Patient complained of painful IV site after initiation of vancomycin.  Site assessed, area beneath IV insertion site was raised and tender.  Approximately 41 mL of vancomycin administered before infusion stopped.  Grade 1 infiltration noted to area.  IV removed, warm compress applied to area.

## 2018-12-02 NOTE — Progress Notes (Signed)
[facility]                                                                              Patient Demographics  Yolanda [name], is a 43 y.o. female, DOB - August 24, 1975, EPP:[id number]  Admit date - 11/28/2018   Admitting Physician Vianne Bulls, MD  Outpatient Primary MD for the patient is System, Pcp Not In  Outpatient specialists:   LOS - 4  days   Medical records reviewed and are as summarized below:    No chief complaint on file.      Brief summary   Patient is a 42 year old female with diabetes type 2, diabetic foot ulcer, osteomyelitis and cellulitis with MRSA bacteremia, presumed endocarditis presented with right lower extremity pain, swelling of her right lower extremity.  She was recently discharged in April for MRSA bacteremia with presumed endocarditis, failed daptomycin and was transitioned to vancomycin (supposed to complete on 11/20/2018).  Patient was seen at [facility] rocking him, CT findings concerning for destruction of the entire mid and hindfoot with fluid and gas collections, surrounding cellulitis. 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   Active Problems: Recent MRSA osteomyelitis, bacteremia, presumed endocarditis -Infectious disease following, plan for TEE 5/26 -New PICC line once repeat blood cultures are negative at 48 hours.  Blood cultures negative so far.   - will place new PICC line tomorrow if okay with ID.  - discontinue cefepime and Flagyl on 5/26.    Diabetes mellitus type  2, uncontrolled, with complications, foot infection ([place]) -CBGs up and down, no new changes today.  Continue Lantus 35 units twice daily, meal coverage, sliding scale  Essential hypertension BP stable  Right lower extremity DVT -Diagnosed in 07/2018, used to be on Xarelto, she completed the starter pack however Xarelto was discontinued due to bleeding issues at [facility] in 09/2018.  Right lower extremity Dopplers on 3/29 did not show any DVT hence Xarelto was not resumed.  Hypercalcemia -Likely due to sepsis, dehydration, calcium 10.5 with a time of admission -Resolved  Severe protein calorie malnutrition -Albumin 1.2 -Placed on pro-stat, nutritional supplements  Acute on chronic anemia  -H&H currently stable baseline 9-10 -Hemoglobin 7.4 at OSH, transfused 1 unit packed RBC,  -Hemoglobin 7.6 stable, transfuse if less than 7  Urinary retention Planing of being unable to urinate, bladder scan showed 415 cc. -UA negative for UTI  - Flomax for 7 days  Code Status: Full code DVT Prophylaxis: SCDs, will place on heparin subcu after surgery Family Communication: Discussed in detail with the patient, all imaging results, lab results explained to the patient    Disposition Plan: Plan for TEE on 5/26  Time Spent in minutes 25 minutes  Procedures:  Transtibial amputation 5/22  Consultants:  Orthopedics, Dr. Sharol Given  Antimicrobials:   Anti-infectives (From admission, onward)   Start     Dose/Rate Route Frequency Ordered Stop   12/01/18 1330  vancomycin (VANCOCIN) 1,250 mg in sodium chloride 0.9 % 250 mL IVPB     1,250 mg 166.7 mL/hr over 90 Minutes Intravenous Every 12 hours 12/01/18 1310     11/30/18 1500  ceFEPIme (MAXIPIME) 2 g in sodium chloride 0.9 % 100 mL IVPB     2 g 200 mL/hr over 30 Minutes Intravenous Every 8 hours 11/30/18 1416     11/30/18 1500  metroNIDAZOLE (FLAGYL) tablet 500 mg     500 mg Oral Every 8 hours 11/30/18 1416     11/30/18 1415   metroNIDAZOLE (FLAGYL) IVPB 500 mg  Status:  Discontinued     500 mg 100 mL/hr over 60 Minutes Intravenous Every 8 hours 11/30/18 1411 11/30/18 1416   11/28/18 2045  vancomycin (VANCOCIN) 1,750 mg in sodium chloride 0.9 % 500 mL IVPB  Status:  Discontinued     1,750 mg 250 mL/hr over 120 Minutes Intravenous Every 24 hours 11/28/18 2032 12/01/18 1310   11/28/18 2030  vancomycin (VANCOCIN) 1,250 mg in sodium chloride 0.9 % 250 mL IVPB  Status:  Discontinued     1,250 mg 166.7 mL/hr over 90 Minutes Intravenous Every 24 hours 11/28/18 2004 11/28/18 2032   11/28/18 1900  ceFEPIme (MAXIPIME) 2 g in sodium chloride 0.9 % 100 mL IVPB  Status:  Discontinued     2 g 200 mL/hr over 30 Minutes Intravenous Every 8 hours 11/28/18 1848 11/30/18 1304   11/28/18 1815  metroNIDAZOLE (FLAGYL) tablet 500 mg  Status:  Discontinued     500 mg Oral Every 8 hours 11/28/18 1803 11/30/18 1304         Medications  Scheduled Meds: . Chlorhexidine Gluconate Cloth  6 each Topical Q0600  . docusate sodium  100 mg Oral BID  . insulin aspart  0-15 Units Subcutaneous TID WC  . insulin aspart  0-5 Units Subcutaneous QHS  . insulin aspart  6 Units Subcutaneous TID WC  . insulin glargine  35 Units Subcutaneous BID  . metroNIDAZOLE  500 mg Oral Q8H  . multivitamin with minerals  1 tablet Oral Daily  . mupirocin ointment  1 application Nasal BID  . Ensure Max Protein  11 oz Oral Daily  . tamsulosin  0.4 mg Oral QPC breakfast   Continuous Infusions: . sodium chloride 250 mL (11/30/18 0650)  . sodium chloride Stopped (11/30/18 1636)  . ceFEPime (MAXIPIME) IV 2 g (12/02/18 1023)  . lactated ringers Stopped (11/30/18 1330)  . methocarbamol (ROBAXIN) IV    . vancomycin 1,250 mg (12/02/18 1218)   PRN Meds:.sodium chloride, acetaminophen **OR** acetaminophen, bisacodyl, HYDROmorphone (DILAUDID) injection, magnesium citrate, methocarbamol **OR** methocarbamol (ROBAXIN) IV, metoCLOPramide **OR** metoCLOPramide (REGLAN)  injection, ondansetron **OR** ondansetron (ZOFRAN) IV, oxyCODONE, oxyCODONE, polyethylene glycol      Subjective:   Yolanda [name] was seen and examined today.  No acute complaints.  Eating breakfast at the time of my examination.  No fevers.   Patient denies dizziness, chest pain, shortness of breath, abdominal pain, N/V/D/C.   Objective:   Vitals:   12/01/18 2027 12/01/18 2333 12/02/18 0328 12/02/18 0600  BP:   (!) 140/97   Pulse:  (!) 109 95   Resp:  (!) 26 (!) 23   Temp: 97.7 F (36.5 C) 98.3 F (36.8 C) (!) 97.5 F (36.4 C)   TempSrc:  Oral Oral  SpO2:  98% 100%   Weight:    80.9 kg  Height:        Intake/Output Summary (Last 24 hours) at 12/02/2018 1443 Last data filed at 12/02/2018 0059 Gross per 24 hour  Intake 525.64 ml  Output 600 ml  Net -74.36 ml     Wt Readings from Last 3 Encounters:  12/02/18 80.9 kg  11/08/18 77.1 kg  10/24/18 77.1 kg   Physical Exam  General: Alert and oriented x 3, NAD  Eyes:  HEENT:  Atraumatic, normocephalic  Cardiovascular: S1 S2 clear,RRR. No pedal edema b/l  Respiratory: CTAB, no wheezing, rales or rhonchi  Gastrointestinal: Soft, nontender, nondistended, NBS  Ext: Right BKA  Neuro: no new deficits  Musculoskeletal: No cyanosis, clubbing  Skin: No rashes  Psych: Normal affect and demeanor, alert and oriented x3     Data Reviewed:  I have personally reviewed following labs and imaging studies  Micro Results Recent Results (from the past 240 hour(s))  MRSA PCR Screening     Status: Abnormal   Collection Time: 11/28/18  5:30 PM  Result Value Ref Range Status   MRSA by PCR POSITIVE (A) NEGATIVE Final    Comment:        The GeneXpert MRSA Assay (FDA approved for NASAL specimens only), is one component of a comprehensive MRSA colonization surveillance program. It is not intended to diagnose MRSA infection nor to guide or monitor treatment for MRSA infections. RESULT CALLED TO, READ BACK BY AND  VERIFIED WITHJefferson Fuel RN 11/28/18 1928 JDW Performed at [facility], [address], [place], [place] [address]   SARS Coronavirus 2 [facility] order, Performed in [facility] hospital lab)     Status: None   Collection Time: 11/28/18  6:29 PM  Result Value Ref Range Status   SARS Coronavirus 2 NEGATIVE NEGATIVE Final    Comment: (NOTE) If result is NEGATIVE SARS-CoV-2 target nucleic acids are NOT DETECTED. The SARS-CoV-2 RNA is generally detectable in upper and lower  respiratory specimens during the acute phase of infection. The lowest  concentration of SARS-CoV-2 viral copies this assay can detect is 250  copies / mL. A negative result does not preclude SARS-CoV-2 infection  and should not be used as the sole basis for treatment or other  patient management decisions.  A negative result may occur with  improper specimen collection / handling, submission of specimen other  than nasopharyngeal swab, presence of viral mutation(s) within the  areas targeted by this assay, and inadequate number of viral copies  (<250 copies / mL). A negative result must be combined with clinical  observations, patient history, and epidemiological information. If result is POSITIVE SARS-CoV-2 target nucleic acids are DETECTED. The SARS-CoV-2 RNA is generally detectable in upper and lower  respiratory specimens dur ing the acute phase of infection.  Positive  results are indicative of active infection with SARS-CoV-2.  Clinical  correlation with patient history and other diagnostic information is  necessary to determine patient infection status.  Positive results do  not rule out bacterial infection or co-infection with other viruses. If result is PRESUMPTIVE POSTIVE SARS-CoV-2 nucleic acids MAY BE PRESENT.   A presumptive positive result was obtained on the submitted specimen  and confirmed on repeat testing.  While 2019 novel coronavirus  (SARS-CoV-2) nucleic acids may be present in the  submitted sample  additional confirmatory testing may be necessary for epidemiological  and / or clinical management purposes  to differentiate between  SARS-CoV-2 and other Sarbecovirus currently known to infect humans.  If clinically indicated additional testing with an alternate test  methodology [phone]) is advised. The SARS-CoV-2 RNA is generally  detectable in upper and lower respiratory sp ecimens during the acute  phase of infection. The expected result is Negative. Fact Sheet for Patients:  StrictlyIdeas.no Fact Sheet for Healthcare Providers: BankingDealers.co.za This test is not yet approved or cleared by the [place] FDA and has been authorized for detection and/or diagnosis of SARS-CoV-2 by FDA under an Emergency Use Authorization (EUA).  This EUA will remain in effect (meaning this test can be used) for the duration of the COVID-19 declaration under Section 564(b)(1) of the Act, 21 U.S.C. section 360bbb-3(b)(1), unless the authorization is terminated or revoked sooner. Performed at [facility], [place] [address] [place], [place] [address]   Culture, blood (routine x 2)     Status: Abnormal   Collection Time: 11/28/18  6:29 PM  Result Value Ref Range Status   Specimen Description BLOOD RIGHT ARM  Final   Special Requests   Final    BOTTLES DRAWN AEROBIC AND ANAEROBIC Blood Culture adequate volume   Culture  Setup Time   Final    GRAM POSITIVE COCCI IN BOTH AEROBIC AND ANAEROBIC BOTTLES CRITICAL RESULT CALLED TO, READ BACK BY AND VERIFIED WITHBronwen Betters [facility] [id number] 11/29/18 A BROWNING Performed at [facility], [place] [address] [place], [place] [address]    Culture METHICILLIN RESISTANT STAPHYLOCOCCUS AUREUS (A)  Final   Report Status 12/01/2018 FINAL  Final   Organism ID, Bacteria METHICILLIN RESISTANT STAPHYLOCOCCUS AUREUS  Final      Susceptibility   Methicillin resistant staphylococcus aureus - MIC*     CIPROFLOXACIN >=8 RESISTANT Resistant     ERYTHROMYCIN >=8 RESISTANT Resistant     GENTAMICIN <=0.5 SENSITIVE Sensitive     OXACILLIN >=4 RESISTANT Resistant     TETRACYCLINE <=1 SENSITIVE Sensitive     VANCOMYCIN <=0.5 SENSITIVE Sensitive     TRIMETH/SULFA <=10 SENSITIVE Sensitive     CLINDAMYCIN >=8 RESISTANT Resistant     RIFAMPIN <=0.5 SENSITIVE Sensitive     Inducible Clindamycin NEGATIVE Sensitive     * METHICILLIN RESISTANT STAPHYLOCOCCUS AUREUS  Blood Culture ID Panel (Reflexed)     Status: Abnormal   Collection Time: 11/28/18  6:29 PM  Result Value Ref Range Status   Enterococcus species NOT DETECTED NOT DETECTED Final   Listeria monocytogenes NOT DETECTED NOT DETECTED Final   Staphylococcus species DETECTED (A) NOT DETECTED Final    Comment: CRITICAL RESULT CALLED TO, READ BACK BY AND VERIFIED WITH: Bronwen Betters PHARMD 1612 11/29/18 A BROWNING    Staphylococcus aureus (BCID) DETECTED (A) NOT DETECTED Final    Comment: Methicillin (oxacillin)-resistant Staphylococcus aureus (MRSA). MRSA is predictably resistant to beta-lactam antibiotics (except ceftaroline). Preferred therapy is vancomycin unless clinically contraindicated. Patient requires contact precautions if  hospitalized. CRITICAL RESULT CALLED TO, READ BACK BY AND VERIFIED WITH: L CURRAN PHARMD [id number] 11/29/18 A BROWNING    Methicillin resistance DETECTED (A) NOT DETECTED Final    Comment: CRITICAL RESULT CALLED TO, READ BACK BY AND VERIFIED WITH: L CURRAN PHARMD 1612 11/29/18 A BROWNING    Streptococcus species NOT DETECTED NOT DETECTED Final   Streptococcus agalactiae NOT DETECTED NOT DETECTED Final   Streptococcus pneumoniae NOT DETECTED NOT DETECTED Final   Streptococcus pyogenes NOT DETECTED NOT DETECTED Final   Acinetobacter baumannii NOT DETECTED NOT DETECTED Final   Enterobacteriaceae species NOT DETECTED  NOT DETECTED Final   Enterobacter cloacae complex NOT DETECTED NOT DETECTED Final   Escherichia coli NOT  DETECTED NOT DETECTED Final   Klebsiella oxytoca NOT DETECTED NOT DETECTED Final   Klebsiella pneumoniae NOT DETECTED NOT DETECTED Final   Proteus species NOT DETECTED NOT DETECTED Final   Serratia marcescens NOT DETECTED NOT DETECTED Final   Haemophilus influenzae NOT DETECTED NOT DETECTED Final   Neisseria meningitidis NOT DETECTED NOT DETECTED Final   Pseudomonas aeruginosa NOT DETECTED NOT DETECTED Final   Candida albicans NOT DETECTED NOT DETECTED Final   Candida glabrata NOT DETECTED NOT DETECTED Final   Candida krusei NOT DETECTED NOT DETECTED Final   Candida parapsilosis NOT DETECTED NOT DETECTED Final   Candida tropicalis NOT DETECTED NOT DETECTED Final    Comment: Performed at [facility], [place] [address], [place], [place] [address]  Culture, blood (routine x 2)     Status: Abnormal   Collection Time: 11/28/18  7:29 PM  Result Value Ref Range Status   Specimen Description BLOOD LEFT ARM  Final   Special Requests   Final    BOTTLES DRAWN AEROBIC AND ANAEROBIC Blood Culture adequate volume   Culture  Setup Time   Final    GRAM POSITIVE COCCI IN BOTH AEROBIC AND ANAEROBIC BOTTLES CRITICAL VALUE NOTED.  VALUE IS CONSISTENT WITH PREVIOUSLY REPORTED AND CALLED VALUE.    Culture (A)  Final    STAPHYLOCOCCUS AUREUS SUSCEPTIBILITIES PERFORMED ON PREVIOUS CULTURE WITHIN THE LAST 5 DAYS. Performed at [facility], [place] [address], [place], [place] [address]    Report Status 12/01/2018 FINAL  Final  Culture, Urine     Status: None   Collection Time: 11/29/18 12:58 PM  Result Value Ref Range Status   Specimen Description URINE, CATHETERIZED  Final   Special Requests NONE  Final   Culture   Final    NO GROWTH Performed at [facility], [place] [address], [place], [place] [address]    Report Status 11/30/2018 FINAL  Final  Culture, blood (routine x 2)     Status: None (Preliminary result)   Collection Time: 11/30/18  3:22 PM  Result Value Ref Range Status    Specimen Description BLOOD LEFT ANTECUBITAL  Final   Special Requests   Final    BOTTLES DRAWN AEROBIC ONLY Blood Culture adequate volume   Culture   Final    NO GROWTH 2 DAYS Performed at [facility], [place] [address], [place], [place] [address]    Report Status PENDING  Incomplete  Culture, blood (routine x 2)     Status: None (Preliminary result)   Collection Time: 11/30/18  3:22 PM  Result Value Ref Range Status   Specimen Description BLOOD RIGHT HAND  Final   Special Requests   Final    BOTTLES DRAWN AEROBIC ONLY Blood Culture adequate volume   Culture   Final    NO GROWTH 2 DAYS Performed at [facility], [place] [address] [place], [place] [address]    Report Status PENDING  Incomplete    Radiology Reports Vas [place] Abi With/wo Tbi  Result Date: 11/29/2018 LOWER EXTREMITY DOPPLER STUDY Indications: Diabetic foot infection. High Risk Factors: Diabetes.  Performing Technologist: Abram Sander RVS  Examination Guidelines: A complete evaluation includes at minimum, Doppler waveform signals and systolic blood pressure reading at the level of bilateral brachial, anterior tibial, and posterior tibial arteries, when vessel segments are accessible. Bilateral testing is considered an  integral part of a complete examination. Photoelectric Plethysmograph (PPG) waveforms and toe systolic pressure readings are included as required and additional duplex testing as needed. Limited examinations for reoccurring indications may be performed as noted.  ABI Findings: +--------+------------------+-----+---------+--------+ Right   Rt Pressure (mmHg)IndexWaveform Comment  +--------+------------------+-----+---------+--------+ [id number]                    triphasic         +--------+------------------+-----+---------+--------+ PTA     151               1.14 biphasic          +--------+------------------+-----+---------+--------+ DP      160               1.20 biphasic           +--------+------------------+-----+---------+--------+ +--------+------------------+-----+---------+-------+ Left    Lt Pressure (mmHg)IndexWaveform Comment +--------+------------------+-----+---------+-------+ [id number]                    triphasic        +--------+------------------+-----+---------+-------+ PTA     147               1.11 triphasic        +--------+------------------+-----+---------+-------+ DP      148               1.11 triphasic        +--------+------------------+-----+---------+-------+ +-------+-----------+-----------+------------+------------+ ABI/TBIToday's ABIToday's TBIPrevious ABIPrevious TBI +-------+-----------+-----------+------------+------------+ Right  1.20                                           +-------+-----------+-----------+------------+------------+ Left   1.11                                           +-------+-----------+-----------+------------+------------+  Summary: Right: Resting right ankle-brachial index is within normal range. No evidence of significant right lower extremity arterial disease. Left: Resting left ankle-brachial index is within normal range. No evidence of significant left lower extremity arterial disease.  *See table(s) above for measurements and observations.  Electronically signed by Monica Martinez MD on 11/29/2018 at 5:28:23 PM.   Final    Xr Ankle Complete Right  Result Date: 11/08/2018 3 view radiographs of the right ankle shows a congruent mortise with no destructive changes of the fibula.  Patient has Charcot collapse through the talar neck with peripheral vascular disease and calcification of the arteries down to the ankle.   Lab Data:  CBC: Recent Labs  Lab 11/28/18 1912 11/29/18 0328 11/30/18 0319 12/01/18 0301 12/02/18 0258  WBC 21.3* 20.2* 21.8* 25.3* 21.0*  HGB 9.5* 8.9* 8.2* 7.6* 7.6*  HCT 29.0* 26.4* 24.2* 22.8* 22.9*  MCV 91.5 90.4 90.3 90.8 91.6  PLT 405* 363 356 334  562   Basic Metabolic Panel: Recent Labs  Lab 11/28/18 1912 11/29/18 0328 11/30/18 0319 12/01/18 0301 12/02/18 0258  NA 134* 135 137 133* 137  K 4.1 4.0 3.4* 3.8 3.5  CL 103 106 107 107 108  CO2 '22 23 23 ' 21* 24  GLUCOSE 232* 247* 93 341* 119*  BUN '19 17 12 9 12  ' CREATININE 0.89 0.97 0.91 0.76 0.65  CALCIUM 10.5* 9.7  9.6 9.2 8.3* 7.9*   GFR: Estimated Creatinine Clearance: 98.2 mL/min (by C-G formula  based on SCr of 0.65 mg/dL). Liver Function Tests: Recent Labs  Lab 11/28/18 1912 11/29/18 0328  AST 33 32  ALT 22 20  ALKPHOS 287* 289*  BILITOT 3.0* 3.3*  PROT 8.6* 7.5  ALBUMIN 1.4* 1.2*   No results for input(s): LIPASE, AMYLASE in the last 168 hours. No results for input(s): AMMONIA in the last 168 hours. Coagulation Profile: No results for input(s): INR, PROTIME in the last 168 hours. Cardiac Enzymes: No results for input(s): CKTOTAL, CKMB, CKMBINDEX, TROPONINI in the last 168 hours. BNP (last 3 results) No results for input(s): PROBNP in the last 8760 hours. HbA1C: No results for input(s): HGBA1C in the last 72 hours. CBG: Recent Labs  Lab 12/01/18 1232 12/01/18 1624 12/01/18 2116 12/02/18 0807 12/02/18 1141  GLUCAP 235* 227* 134* 110* 185*   Lipid Profile: No results for input(s): CHOL, HDL, LDLCALC, TRIG, CHOLHDL, LDLDIRECT in the last 72 hours. Thyroid Function Tests: No results for input(s): TSH, T4TOTAL, FREET4, T3FREE, THYROIDAB in the last 72 hours. Anemia Panel: No results for input(s): VITAMINB12, FOLATE, FERRITIN, TIBC, IRON, RETICCTPCT in the last 72 hours. Urine analysis:    Component Value Date/Time   COLORURINE YELLOW 11/29/2018 [place] 11/29/2018 0653   LABSPEC 1.010 11/29/2018 0653   PHURINE 5.5 11/29/2018 0653   GLUCOSEU NEGATIVE 11/29/2018 0653   HGBUR TRACE (A) 11/29/2018 0653   BILIRUBINUR SMALL (A) 11/29/2018 0653   KETONESUR NEGATIVE 11/29/2018 0653   PROTEINUR NEGATIVE 11/29/2018 0653   NITRITE  NEGATIVE 11/29/2018 0653   LEUKOCYTESUR NEGATIVE 11/29/2018 0653     Ripudeep Rai M.D. [facility] 12/02/2018, 2:43 PM  Pager: [phone] Between 7am to 7pm - call Pager - [phone]  After 7pm go to [email or web address] - password [email or web address]  Call night coverage person covering after 7pm

## 2018-12-02 NOTE — Evaluation (Signed)
Occupational Therapy Evaluation Patient Details Name: Yolanda [name] MRN: [id number] DOB: 1975-12-26 Today's Date: 12/02/2018    History of Present Illness Pt is a 43 y.o. F with significant PMH of diabetes type 2, diabetic foot ulcer, who presents with sepsis with right foot infection, osteomyelitis and MRSA bacteremia and presumed endocarditis. 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   Clinical Impression   Pt PTA: pt reports independence with AD and performing own ADL. Pt currently limited by new R BKA and LLE wounds that hurt. Pt able to bear wt on LLE today for bed mobility and lateral scoot from bed to drop arm recliner with modA and use of pad. Pt set-upA for UB ADL and modA for LB ADL. Pt attempting to perform LB dressing at EOB, but too awkward with residual limb guard. Pt would benefit from continued OT skilled services for ADL, mobility and safety in CIR setting. OT to follow acutely.  O2>90% on RA w/ exertion.     Follow Up Recommendations  CIR;Supervision/Assistance - 24 hour    Equipment Recommendations  Other (comment)(to be determined at next venue)    Recommendations for Other Services Rehab consult     Precautions / Restrictions Precautions Precautions: Fall Precaution Comments: R BKA, wound vac Required Braces or Orthoses: Other Brace Other Brace: R residual limb guard Restrictions Weight Bearing Restrictions: Yes RLE Weight Bearing: Non weight bearing      Mobility Bed Mobility Overal bed mobility: Needs Assistance Bed Mobility: Supine to Sit;Sit to Supine;Rolling Rolling: Supervision   Supine to sit: Mod assist     General bed mobility comments: Pt using bed rails for trunk elevation; pt performing bed mobility with modA for use of pad to scoot hips over.  Transfers Overall transfer level: Needs assistance Equipment used: 1 person hand held assist;None Transfers: Lateral/Scoot Transfers          Lateral/Scoot Transfers: Mod assist       Balance Overall balance assessment: Needs assistance Sitting-balance support: Bilateral upper extremity supported Sitting balance-Leahy Scale: Good                                     ADL either performed or assessed with clinical judgement   ADL Overall ADL's : Needs assistance/impaired Eating/Feeding: Modified independent;Sitting   Grooming: Modified independent;Sitting   Upper Body Bathing: Set up;Sitting   Lower Body Bathing: Moderate assistance;Sitting/lateral leans;Bed level   Upper Body Dressing : Set up;Sitting   Lower Body Dressing: Moderate assistance;Sitting/lateral leans;Bed level   Toilet Transfer: Requires drop arm;BSC;Anterior/posterior   Toileting- Clothing Manipulation and Hygiene: Moderate assistance;Sitting/lateral lean;Bed level       Functional mobility during ADLs: Moderate assistance(lateral transfer only) General ADL Comments: pt performing lateral transfer from bed to recliner with modA and pt tolerating grooming seated and modified independence for feeding.     Vision Baseline Vision/History: No visual deficits Vision Assessment?: No apparent visual deficits     Perception     Praxis      Pertinent Vitals/Pain Pain Assessment: Faces Faces Pain Scale: Hurts little more Pain Location: RLE with movements Pain Descriptors / Indicators: Discomfort     Hand Dominance Right   Extremity/Trunk Assessment Upper Extremity Assessment LUE Deficits / Details: increased edema   Lower Extremity Assessment Lower Extremity Assessment: Defer to PT evaluation;RLE deficits/detail RLE Deficits / Details: s/p BKA.        Communication Communication Communication: No difficulties  Cognition Arousal/Alertness: Awake/alert Behavior During Therapy: WFL for tasks assessed/performed Overall Cognitive Status: Impaired/Different from baseline Area of Impairment: Problem solving                             Problem Solving:  Requires verbal cues     General Comments  Pt performing lateral scoot to recliner with modA and good strength in BUEs to assist. Pt tolerating session well; no severe pain.    Exercises     Shoulder Instructions      Home Living Family/patient expects to be discharged to:: Private residence Living Arrangements: Parent Available Help at Discharge: Family Type of Home: House Home Access: Stairs to enter Secretary/administrator of Steps: 1 Entrance Stairs-Rails: None Home Layout: Able to live on main level with bedroom/bathroom     Bathroom Shower/Tub: Chief Strategy Officer: Handicapped height Bathroom Accessibility: Yes   Home Equipment: Environmental consultant - 2 wheels;Cane - quad          Prior Functioning/Environment Level of Independence: Independent with assistive device(s)        Comments: using walker         OT Problem List: Decreased strength;Decreased activity tolerance;Impaired balance (sitting and/or standing);Decreased safety awareness;Pain      OT Treatment/Interventions: Self-care/ADL training;Therapeutic exercise;Neuromuscular education;Energy conservation;Therapeutic activities;Patient/family education;Balance training    OT Goals(Current goals can be found in the care plan section) Acute Rehab OT Goals Patient Stated Goal: "get a prosthetic." OT Goal Formulation: With patient Time For Goal Achievement: 12/16/18 Potential to Achieve Goals: Good  OT Frequency: Min 2X/week   Barriers to D/C:            Co-evaluation              AM-PAC OT "6 Clicks" Daily Activity     Outcome Measure Help from another person eating meals?: None Help from another person taking care of personal grooming?: None Help from another person toileting, which includes using toliet, bedpan, or urinal?: A Lot Help from another person bathing (including washing, rinsing, drying)?: A Lot Help from another person to put on and taking off regular upper body clothing?:  A Little Help from another person to put on and taking off regular lower body clothing?: A Lot 6 Click Score: 17   End of Session Equipment Utilized During Treatment: Gait belt Nurse Communication: Mobility status  Activity Tolerance: Patient tolerated treatment well Patient left: in chair;with call bell/phone within reach;with chair alarm set  OT Visit Diagnosis: Unsteadiness on feet (R26.81);Muscle weakness (generalized) (M62.81)                Time: 3295-1884 OT Time Calculation (min): 25 min Charges:  OT General Charges $OT Visit: 1 Visit OT Evaluation $OT Eval Moderate Complexity: 1 Mod OT Treatments $Self Care/Home Management : 8-22 mins  Revonda Standard Cecil Cranker) Glendell Docker OTR/L Acute Rehabilitation Services Pager: [phone] Office: [phone]  [name] 12/02/2018, 3:56 PM

## 2018-12-02 NOTE — Progress Notes (Signed)
    [facility] has been requested to perform a transesophageal echocardiogram on Yolanda [name] for 5/26 with Dr. Delton See for SBE.  After careful review of history and examination, the risks and benefits of transesophageal echocardiogram have been explained including risks of esophageal damage, perforation (1:10,000 risk), bleeding, pharyngeal hematoma as well as other potential complications associated with conscious sedation including aspiration, arrhythmia, respiratory failure and death. Alternatives to treatment were discussed, questions were answered. Patient is willing to proceed.   Nicolasa Ducking, NP  12/02/2018 3:37 PM

## 2018-12-02 NOTE — Progress Notes (Signed)
Subjective: 2 Days Post-Op Procedure(s) (LRB): RIGHT BELOW KNEE AMPUTATION (Right) Application Of Wound Vac (Right) Patient reports pain as mild.    Objective: Vital signs in last 24 hours: Temp:  [97.5 F (36.4 C)-98.3 F (36.8 C)] 97.5 F (36.4 C) (05/24 0328) Pulse Rate:  [95-109] 95 (05/24 0328) Resp:  [23-26] 23 (05/24 0328) BP: (140)/(97) 140/97 (05/24 0328) SpO2:  [98 %-100 %] 100 % (05/24 0328) Weight:  [80.9 kg] 80.9 kg (05/24 0600)  Intake/Output from previous day: 05/23 0701 - 05/24 0700 In: 636.6 [P.O.:351; IV Piggyback:285.6] Out: 600 [Urine:600] Intake/Output this shift: No intake/output data recorded.  Recent Labs    11/30/18 0319 12/01/18 0301 12/02/18 0258  HGB 8.2* 7.6* 7.6*   Recent Labs    12/01/18 0301 12/02/18 0258  WBC 25.3* 21.0*  RBC 2.51* 2.50*  HCT 22.8* 22.9*  PLT 334 346   Recent Labs    12/01/18 0301 12/02/18 0258  NA 133* 137  K 3.8 3.5  CL 107 108  CO2 21* 24  BUN 9 12  CREATININE 0.76 0.65  GLUCOSE 341* 119*  CALCIUM 8.3* 7.9*   No results for input(s): LABPT, INR in the last 72 hours.  VAC dressing in place over the right transtibial amputation site and functioning well. NO drainage in VAC canister.    Assessment/Plan: 2 Days Post-Op Procedure(s) (LRB): RIGHT BELOW KNEE AMPUTATION (Right) Application Of Wound Vac (Right) Continue VAC therapy and DC on PRevena VAC once DC to CIR x 1 week Continue therapy and plan CIR  Continues cefepime and Flagyl through tomorrow Continues Vancomycin for MRSA bacteremia  Lazaro Arms, PA-C 12/02/2018, 9:16 AM  [facility] [phone]

## 2018-12-03 ENCOUNTER — Inpatient Hospital Stay: Payer: Self-pay

## 2018-12-03 DIAGNOSIS — Z89511 Acquired absence of right leg below knee: Secondary | ICD-10-CM

## 2018-12-03 LAB — GLUCOSE, CAPILLARY
Glucose-Capillary: 67 mg/dL — ABNORMAL LOW (ref 70–99)
Glucose-Capillary: 82 mg/dL (ref 70–99)
Glucose-Capillary: 159 mg/dL — ABNORMAL HIGH (ref 70–99)
Glucose-Capillary: 166 mg/dL — ABNORMAL HIGH (ref 70–99)
Glucose-Capillary: 166 mg/dL — ABNORMAL HIGH (ref 70–99)

## 2018-12-03 MED ORDER — INSULIN GLARGINE 100 UNIT/ML ~~LOC~~ SOLN
30.0000 [IU] | Freq: Two times a day (BID) | SUBCUTANEOUS | Status: DC
  Administered 2018-12-03: 30 [IU] via SUBCUTANEOUS
  Filled 2018-12-03 (×3): qty 0.3

## 2018-12-03 MED ORDER — SODIUM CHLORIDE 0.9 % IV SOLN
INTRAVENOUS | Status: DC
  Administered 2018-12-03: 22:00:00 via INTRAVENOUS

## 2018-12-03 NOTE — Progress Notes (Signed)
Patient ID: Yolanda [name], female   DOB: Dec 20, 1975, 43 y.o.   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  She is improving after right BKA 3 days ago.  Repeat blood cultures are negative at 72 hours.  I will order PICC placement.  We can safely narrow therapy to vancomycin alone.  PLAN: 1. Continue vancomycin 2. Discontinue cefepime and metronidazole 3. PICC placement 4. TEE tomorrow  Principal Problem:   MRSA bacteremia Active Problems:   Subacute osteomyelitis, right ankle and foot (HCC)   Cutaneous abscess of right foot   Diabetes mellitus type 2, uncontrolled, with complications (HCC)   Charcot foot due to diabetes mellitus (HCC)   Severe protein-calorie malnutrition (HCC)   Diabetic polyneuropathy associated with type 2 diabetes mellitus (HCC)   Cigarette smoker   Scheduled Meds: . docusate sodium  100 mg Oral BID  . insulin aspart  0-15 Units Subcutaneous TID WC  . insulin aspart  0-5 Units Subcutaneous QHS  . insulin aspart  6 Units Subcutaneous TID WC  . insulin glargine  35 Units Subcutaneous BID  . metroNIDAZOLE  500 mg Oral Q8H  . multivitamin with minerals  1 tablet Oral Daily  . Ensure Max Protein  11 oz Oral Daily  . tamsulosin  0.4 mg Oral QPC breakfast   Continuous Infusions: . sodium chloride 250 mL (11/30/18 0650)  . sodium chloride Stopped (11/30/18 1636)  . ceFEPime (MAXIPIME) IV 2 g (12/03/18 1012)  . lactated ringers Stopped (11/30/18 1330)  . methocarbamol (ROBAXIN) IV    . vancomycin 1,250 mg (12/03/18 1136)   PRN Meds:.sodium chloride, acetaminophen **OR** acetaminophen, bisacodyl, HYDROmorphone (DILAUDID) injection, magnesium citrate, methocarbamol **OR** methocarbamol (ROBAXIN) IV, metoCLOPramide **OR** metoCLOPramide  (REGLAN) injection, ondansetron **OR** ondansetron (ZOFRAN) IV, oxyCODONE, oxyCODONE, polyethylene glycol   SUBJECTIVE: She is feeling better.  Review of Systems: Review of Systems  Constitutional: Negative for chills, diaphoresis and fever.  Gastrointestinal: Negative for abdominal pain, diarrhea, nausea and vomiting.    No Known Allergies  OBJECTIVE: Vitals:   12/03/18 0114 12/03/18 0422 12/03/18 0514 12/03/18 0641  BP: 109/71  119/90   Pulse: (!) 108  (!) 103   Resp: (!) 24  20   Temp:  98.5 F (36.9 C)    TempSrc:      SpO2: 96%  96%   Weight:    80 kg  Height:       Body mass index is 28.47 kg/m.  Physical Exam Constitutional:      Comments: She is resting quietly in bed talking on the phone.  Cardiovascular:     Rate and Rhythm: Normal rate and regular rhythm.     Heart sounds: Murmur present.  Pulmonary:     Effort: Pulmonary effort is normal.     Breath sounds: Normal breath sounds.  Psychiatric:     Comments: Flat affect.     Lab Results Lab Results  Component Value Date   WBC 21.0 (H) 12/02/2018   HGB 7.6 (L) 12/02/2018   HCT 22.9 (L) 12/02/2018   MCV 91.6 12/02/2018   PLT 346 12/02/2018    Lab Results  Component Value Date  CREATININE 0.65 12/02/2018   BUN 12 12/02/2018   NA 137 12/02/2018   K 3.5 12/02/2018   CL 108 12/02/2018   CO2 24 12/02/2018    Lab Results  Component Value Date   ALT 20 11/29/2018   AST 32 11/29/2018   ALKPHOS 289 (H) 11/29/2018   BILITOT 3.3 (H) 11/29/2018     Microbiology: Recent Results (from the past 240 hour(s))  MRSA PCR Screening     Status: Abnormal   Collection Time: 11/28/18  5:30 PM  Result Value Ref Range Status   MRSA by PCR POSITIVE (A) NEGATIVE Final    Comment:        The GeneXpert MRSA Assay (FDA approved for NASAL specimens only), is one component of a comprehensive MRSA colonization surveillance program. It is not intended to diagnose MRSA infection nor to guide or monitor  treatment for MRSA infections. RESULT CALLED TO, READ BACK BY AND VERIFIED WITHSilverio Decamp RN 11/28/18 1928 JDW Performed at [facility], [address] [place], [place] [address]   SARS Coronavirus 2 [facility] order, Performed in [facility] hospital lab)     Status: None   Collection Time: 11/28/18  6:29 PM  Result Value Ref Range Status   SARS Coronavirus 2 NEGATIVE NEGATIVE Final    Comment: (NOTE) If result is NEGATIVE SARS-CoV-2 target nucleic acids are NOT DETECTED. The SARS-CoV-2 RNA is generally detectable in upper and lower  respiratory specimens during the acute phase of infection. The lowest  concentration of SARS-CoV-2 viral copies this assay can detect is 250  copies / mL. A negative result does not preclude SARS-CoV-2 infection  and should not be used as the sole basis for treatment or other  patient management decisions.  A negative result may occur with  improper specimen collection / handling, submission of specimen other  than nasopharyngeal swab, presence of viral mutation(s) within the  areas targeted by this assay, and inadequate number of viral copies  (<250 copies / mL). A negative result must be combined with clinical  observations, patient history, and epidemiological information. If result is POSITIVE SARS-CoV-2 target nucleic acids are DETECTED. The SARS-CoV-2 RNA is generally detectable in upper and lower  respiratory specimens dur ing the acute phase of infection.  Positive  results are indicative of active infection with SARS-CoV-2.  Clinical  correlation with patient history and other diagnostic information is  necessary to determine patient infection status.  Positive results do  not rule out bacterial infection or co-infection with other viruses. If result is PRESUMPTIVE POSTIVE SARS-CoV-2 nucleic acids MAY BE PRESENT.   A presumptive positive result was obtained on the submitted specimen  and confirmed on repeat testing.  While 2019 novel  coronavirus  (SARS-CoV-2) nucleic acids may be present in the submitted sample  additional confirmatory testing may be necessary for epidemiological  and / or clinical management purposes  to differentiate between  SARS-CoV-2 and other Sarbecovirus currently known to infect humans.  If clinically indicated additional testing with an alternate test  methodology [phone]) is advised. The SARS-CoV-2 RNA is generally  detectable in upper and lower respiratory sp ecimens during the acute  phase of infection. The expected result is Negative. Fact Sheet for Patients:  BoilerBrush.com.cy Fact Sheet for Healthcare Providers: https://pope.com/ This test is not yet approved or cleared by the [place] FDA and has been authorized for detection and/or diagnosis of SARS-CoV-2 by FDA under an Emergency Use Authorization (EUA).  This EUA will remain  in effect (meaning this test can be used) for the duration of the COVID-19 declaration under Section 564(b)(1) of the Act, 21 U.S.C. section 360bbb-3(b)(1), unless the authorization is terminated or revoked sooner. Performed at [facility], [address] [place], [place] [address]   Culture, blood (routine x 2)     Status: Abnormal   Collection Time: 11/28/18  6:29 PM  Result Value Ref Range Status   Specimen Description BLOOD RIGHT ARM  Final   Special Requests   Final    BOTTLES DRAWN AEROBIC AND ANAEROBIC Blood Culture adequate volume   Culture  Setup Time   Final    GRAM POSITIVE COCCI IN BOTH AEROBIC AND ANAEROBIC BOTTLES CRITICAL RESULT CALLED TO, READ BACK BY AND VERIFIED WITHLelon Huh: L CURRAN [facility] [id number] 11/29/18 A BROWNING Performed at [facility], [address] [place], [place] [address]    Culture METHICILLIN RESISTANT STAPHYLOCOCCUS AUREUS (A)  Final   Report Status 12/01/2018 FINAL  Final   Organism ID, Bacteria METHICILLIN RESISTANT STAPHYLOCOCCUS AUREUS  Final       Susceptibility   Methicillin resistant staphylococcus aureus - MIC*    CIPROFLOXACIN >=8 RESISTANT Resistant     ERYTHROMYCIN >=8 RESISTANT Resistant     GENTAMICIN <=0.5 SENSITIVE Sensitive     OXACILLIN >=4 RESISTANT Resistant     TETRACYCLINE <=1 SENSITIVE Sensitive     VANCOMYCIN <=0.5 SENSITIVE Sensitive     TRIMETH/SULFA <=10 SENSITIVE Sensitive     CLINDAMYCIN >=8 RESISTANT Resistant     RIFAMPIN <=0.5 SENSITIVE Sensitive     Inducible Clindamycin NEGATIVE Sensitive     * METHICILLIN RESISTANT STAPHYLOCOCCUS AUREUS  Blood Culture ID Panel (Reflexed)     Status: Abnormal   Collection Time: 11/28/18  6:29 PM  Result Value Ref Range Status   Enterococcus species NOT DETECTED NOT DETECTED Final   Listeria monocytogenes NOT DETECTED NOT DETECTED Final   Staphylococcus species DETECTED (A) NOT DETECTED Final    Comment: CRITICAL RESULT CALLED TO, READ BACK BY AND VERIFIED WITH: Lelon HuhL CURRAN PHARMD 1612 11/29/18 A BROWNING    Staphylococcus aureus (BCID) DETECTED (A) NOT DETECTED Final    Comment: Methicillin (oxacillin)-resistant Staphylococcus aureus (MRSA). MRSA is predictably resistant to beta-lactam antibiotics (except ceftaroline). Preferred therapy is vancomycin unless clinically contraindicated. Patient requires contact precautions if  hospitalized. CRITICAL RESULT CALLED TO, READ BACK BY AND VERIFIED WITH: L CURRAN PHARMD 1612 11/29/18 A BROWNING    Methicillin resistance DETECTED (A) NOT DETECTED Final    Comment: CRITICAL RESULT CALLED TO, READ BACK BY AND VERIFIED WITH: L CURRAN PHARMD 1612 11/29/18 A BROWNING    Streptococcus species NOT DETECTED NOT DETECTED Final   Streptococcus agalactiae NOT DETECTED NOT DETECTED Final   Streptococcus pneumoniae NOT DETECTED NOT DETECTED Final   Streptococcus pyogenes NOT DETECTED NOT DETECTED Final   Acinetobacter baumannii NOT DETECTED NOT DETECTED Final   Enterobacteriaceae species NOT DETECTED NOT DETECTED Final   Enterobacter  cloacae complex NOT DETECTED NOT DETECTED Final   Escherichia coli NOT DETECTED NOT DETECTED Final   Klebsiella oxytoca NOT DETECTED NOT DETECTED Final   Klebsiella pneumoniae NOT DETECTED NOT DETECTED Final   Proteus species NOT DETECTED NOT DETECTED Final   Serratia marcescens NOT DETECTED NOT DETECTED Final   Haemophilus influenzae NOT DETECTED NOT DETECTED Final   Neisseria meningitidis NOT DETECTED NOT DETECTED Final   Pseudomonas aeruginosa NOT DETECTED NOT DETECTED Final   Candida albicans NOT DETECTED NOT DETECTED Final   Candida  glabrata NOT DETECTED NOT DETECTED Final   Candida krusei NOT DETECTED NOT DETECTED Final   Candida parapsilosis NOT DETECTED NOT DETECTED Final   Candida tropicalis NOT DETECTED NOT DETECTED Final    Comment: Performed at [facility], [address] [place], [place] [address]  Culture, blood (routine x 2)     Status: Abnormal   Collection Time: 11/28/18  7:29 PM  Result Value Ref Range Status   Specimen Description BLOOD LEFT ARM  Final   Special Requests   Final    BOTTLES DRAWN AEROBIC AND ANAEROBIC Blood Culture adequate volume   Culture  Setup Time   Final    GRAM POSITIVE COCCI IN BOTH AEROBIC AND ANAEROBIC BOTTLES CRITICAL VALUE NOTED.  VALUE IS CONSISTENT WITH PREVIOUSLY REPORTED AND CALLED VALUE.    Culture (A)  Final    STAPHYLOCOCCUS AUREUS SUSCEPTIBILITIES PERFORMED ON PREVIOUS CULTURE WITHIN THE LAST 5 DAYS. Performed at [facility], [address] [place], [place] [address]    Report Status 12/01/2018 FINAL  Final  Culture, Urine     Status: None   Collection Time: 11/29/18 12:58 PM  Result Value Ref Range Status   Specimen Description URINE, CATHETERIZED  Final   Special Requests NONE  Final   Culture   Final    NO GROWTH Performed at [facility], [address], [place], [place] [address]    Report Status 11/30/2018 FINAL  Final  Culture, blood (routine x 2)     Status: None (Preliminary result)    Collection Time: 11/30/18  3:22 PM  Result Value Ref Range Status   Specimen Description BLOOD LEFT ANTECUBITAL  Final   Special Requests   Final    BOTTLES DRAWN AEROBIC ONLY Blood Culture adequate volume   Culture   Final    NO GROWTH 3 DAYS Performed at [facility], [address] [place], [place] [address]    Report Status PENDING  Incomplete  Culture, blood (routine x 2)     Status: None (Preliminary result)   Collection Time: 11/30/18  3:22 PM  Result Value Ref Range Status   Specimen Description BLOOD RIGHT HAND  Final   Special Requests   Final    BOTTLES DRAWN AEROBIC ONLY Blood Culture adequate volume   Culture   Final    NO GROWTH 3 DAYS Performed at [facility], [address] [place], [place] [address]    Report Status PENDING  Incomplete    Cliffton Asters, MD [facility] [phone] pager   [phone] cell 12/03/2018, 12:49 PM

## 2018-12-03 NOTE — H&P (View-Only) (Signed)
[facility]                                                                              Patient Demographics  Yolanda [name], is a 43 y.o. female, DOB - 1975/09/30, LGX:[id number]  Admit date - 11/28/2018   Admitting Physician Vianne Bulls, MD  Outpatient Primary MD for the patient is System, Pcp Not In  Outpatient specialists:   LOS - 5  days   Medical records reviewed and are as summarized below:    No chief complaint on file.      Brief summary   Patient is a 43 year old female with diabetes type 2, diabetic foot ulcer, osteomyelitis and cellulitis with MRSA bacteremia, presumed endocarditis presented with right lower extremity pain, swelling of her right lower extremity.  She was recently discharged in April for MRSA bacteremia with presumed endocarditis, failed daptomycin and was transitioned to vancomycin (supposed to complete on 11/20/2018).  Patient was seen at [facility] rocking him, CT findings concerning for destruction of the entire mid and hindfoot with fluid and gas collections, surrounding cellulitis. Patient was admitted for further work-up  Assessment & Plan    Principal Problem: Sepsis with right foot infection, osteomyelitis -Patient met sepsis criteria at the time of admission, initially was tachycardia to heart rate 80s, SBP in 80s, lactate 5.7, source right foot osteomyelitis -Blood cultures negative so far.  Patient was placed on IV vancomycin and cefepime, metronidazole.  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  CIR consult placed   Active Problems: Recent MRSA osteomyelitis, bacteremia, presumed endocarditis -Infectious disease following, plan for TEE 5/26, n.p.o. after midnight -Patient was placed on IV vancomycin, cefepime and Flagyl -Per ID recommendations, DC cefepime and Flagyl and narrow antibiotics to vancomycin    Diabetes mellitus type 2, uncontrolled,  with complications, foot infection (HCC) -CBGs lower earlier this morning, will decrease Lantus to 30units BID -Continue sliding scale insulin and meal coverage   Essential hypertension BP stable  Right lower extremity DVT -Diagnosed in 07/2018, used to be on Xarelto, she completed the starter pack however Xarelto was discontinued due to bleeding issues at [facility] in 09/2018.  Right lower extremity Dopplers on 3/29 did not show any DVT hence Xarelto was not resumed.  Hypercalcemia -Likely due to sepsis, dehydration, calcium 10.5 with a time of admission -Resolved  Severe protein calorie malnutrition -Albumin 1.2 -Placed on pro-stat, nutritional supplements  Acute on chronic anemia  -H&H currently stable baseline 9-10 -Hemoglobin 7.4 at OSH, transfused 1 unit packed RBC,  -H&H stable, CBC in a.m.  Urinary retention -UA negative for UTI  - Flomax for 7 days  Code Status: Full code DVT Prophylaxis: SCDs, will place on heparin subcu after surgery Family Communication: Discussed in detail with the patient, all imaging results, lab results explained to the patient    Disposition Plan: Plan for TEE on 5/26, then inpatient rehab post BKA  Time Spent in minutes 25 minutes  Procedures:  Transtibial amputation 5/22  Consultants:   Orthopedics, Dr. Sharol Given Infectious disease CIR  Antimicrobials:   Anti-infectives (From admission, onward)  Start     Dose/Rate Route Frequency Ordered Stop   12/01/18 1330  vancomycin (VANCOCIN) 1,250 mg in sodium chloride 0.9 % 250 mL IVPB     1,250 mg 166.7 mL/hr over 90 Minutes Intravenous Every 12 hours 12/01/18 1310     11/30/18 1500  ceFEPIme (MAXIPIME) 2 g in sodium chloride 0.9 % 100 mL IVPB  Status:  Discontinued     2 g 200 mL/hr over 30 Minutes Intravenous Every 8 hours 11/30/18 1416 12/03/18 1254   11/30/18 1500  metroNIDAZOLE (FLAGYL) tablet 500 mg  Status:  Discontinued     500 mg Oral Every 8 hours 11/30/18 1416  12/03/18 1254   11/30/18 1415  metroNIDAZOLE (FLAGYL) IVPB 500 mg  Status:  Discontinued     500 mg 100 mL/hr over 60 Minutes Intravenous Every 8 hours 11/30/18 1411 11/30/18 1416   11/28/18 2045  vancomycin (VANCOCIN) 1,750 mg in sodium chloride 0.9 % 500 mL IVPB  Status:  Discontinued     1,750 mg 250 mL/hr over 120 Minutes Intravenous Every 24 hours 11/28/18 2032 12/01/18 1310   11/28/18 2030  vancomycin (VANCOCIN) 1,250 mg in sodium chloride 0.9 % 250 mL IVPB  Status:  Discontinued     1,250 mg 166.7 mL/hr over 90 Minutes Intravenous Every 24 hours 11/28/18 2004 11/28/18 2032   11/28/18 1900  ceFEPIme (MAXIPIME) 2 g in sodium chloride 0.9 % 100 mL IVPB  Status:  Discontinued     2 g 200 mL/hr over 30 Minutes Intravenous Every 8 hours 11/28/18 1848 11/30/18 1304   11/28/18 1815  metroNIDAZOLE (FLAGYL) tablet 500 mg  Status:  Discontinued     500 mg Oral Every 8 hours 11/28/18 1803 11/30/18 1304         Medications  Scheduled Meds: . docusate sodium  100 mg Oral BID  . insulin aspart  0-15 Units Subcutaneous TID WC  . insulin aspart  0-5 Units Subcutaneous QHS  . insulin aspart  6 Units Subcutaneous TID WC  . insulin glargine  35 Units Subcutaneous BID  . multivitamin with minerals  1 tablet Oral Daily  . Ensure Max Protein  11 oz Oral Daily  . tamsulosin  0.4 mg Oral QPC breakfast   Continuous Infusions: . sodium chloride 250 mL (11/30/18 0650)  . sodium chloride Stopped (11/30/18 1636)  . lactated ringers Stopped (11/30/18 1330)  . methocarbamol (ROBAXIN) IV    . vancomycin 1,250 mg (12/03/18 1136)   PRN Meds:.sodium chloride, acetaminophen **OR** acetaminophen, bisacodyl, HYDROmorphone (DILAUDID) injection, magnesium citrate, methocarbamol **OR** methocarbamol (ROBAXIN) IV, metoCLOPramide **OR** metoCLOPramide (REGLAN) injection, ondansetron **OR** ondansetron (ZOFRAN) IV, oxyCODONE, oxyCODONE, polyethylene glycol      Subjective:   [name] was seen and  examined today.  No acute complaints, hoping to go home soon.  No fevers or chills. Patient denies dizziness, chest pain, shortness of breath, abdominal pain, N/V/D/C.   Objective:   Vitals:   12/03/18 0422 12/03/18 0514 12/03/18 0641 12/03/18 1340  BP:  119/90  (!) 136/94  Pulse:  (!) 103  (!) 104  Resp:  20  (!) 26  Temp: 98.5 F (36.9 C)   98 F (36.7 C)  TempSrc:    Oral  SpO2:  96%  100%  Weight:   80 kg   Height:        Intake/Output Summary (Last 24 hours) at 12/03/2018 1353 Last data filed at 12/03/2018 0745 Gross per 24 hour  Intake 240 ml  Output 800 ml  Net -560 ml     Wt Readings from Last 3 Encounters:  12/03/18 80 kg  11/08/18 77.1 kg  10/24/18 77.1 kg    Physical Exam  General: Alert and oriented x 3, NAD  Eyes:   HEENT:  Atraumatic, normocephalic  Cardiovascular: S1 S2 clear, RRR.  Respiratory: CTAB, no wheezing, rales or rhonchi  Gastrointestinal: Soft, nontender, nondistended, NBS  Ext: Right BKA  Neuro: no new deficits  Musculoskeletal: No cyanosis, clubbing  Skin: No rashes  Psych: Normal affect and demeanor, alert and oriented x3    Data Reviewed:  I have personally reviewed following labs and imaging studies  Micro Results Recent Results (from the past 240 hour(s))  MRSA PCR Screening     Status: Abnormal   Collection Time: 11/28/18  5:30 PM  Result Value Ref Range Status   MRSA by PCR POSITIVE (A) NEGATIVE Final    Comment:        The GeneXpert MRSA Assay (FDA approved for NASAL specimens only), is one component of a comprehensive MRSA colonization surveillance program. It is not intended to diagnose MRSA infection nor to guide or monitor treatment for MRSA infections. RESULT CALLED TO, READ BACK BY AND VERIFIED WITHJefferson Fuel RN 11/28/18 1928 JDW Performed at [facility], [address], [place], [place] [address]   SARS Coronavirus 2 [facility] order, Performed in [facility] hospital lab)     Status: None    Collection Time: 11/28/18  6:29 PM  Result Value Ref Range Status   SARS Coronavirus 2 NEGATIVE NEGATIVE Final    Comment: (NOTE) If result is NEGATIVE SARS-CoV-2 target nucleic acids are NOT DETECTED. The SARS-CoV-2 RNA is generally detectable in upper and lower  respiratory specimens during the acute phase of infection. The lowest  concentration of SARS-CoV-2 viral copies this assay can detect is 250  copies / mL. A negative result does not preclude SARS-CoV-2 infection  and should not be used as the sole basis for treatment or other  patient management decisions.  A negative result may occur with  improper specimen collection / handling, submission of specimen other  than nasopharyngeal swab, presence of viral mutation(s) within the  areas targeted by this assay, and inadequate number of viral copies  (<250 copies / mL). A negative result must be combined with clinical  observations, patient history, and epidemiological information. If result is POSITIVE SARS-CoV-2 target nucleic acids are DETECTED. The SARS-CoV-2 RNA is generally detectable in upper and lower  respiratory specimens dur ing the acute phase of infection.  Positive  results are indicative of active infection with SARS-CoV-2.  Clinical  correlation with patient history and other diagnostic information is  necessary to determine patient infection status.  Positive results do  not rule out bacterial infection or co-infection with other viruses. If result is PRESUMPTIVE POSTIVE SARS-CoV-2 nucleic acids MAY BE PRESENT.   A presumptive positive result was obtained on the submitted specimen  and confirmed on repeat testing.  While 2019 novel coronavirus  (SARS-CoV-2) nucleic acids may be present in the submitted sample  additional confirmatory testing may be necessary for epidemiological  and / or clinical management purposes  to differentiate between  SARS-CoV-2 and other Sarbecovirus currently known to infect humans.   If clinically indicated additional testing with an alternate test  methodology [phone]) is advised. The SARS-CoV-2 RNA is generally  detectable in upper and lower respiratory sp ecimens during the acute  phase of infection. The expected result is Negative.  Fact Sheet for Patients:  StrictlyIdeas.no Fact Sheet for Healthcare Providers: BankingDealers.co.za This test is not yet approved or cleared by the [place] FDA and has been authorized for detection and/or diagnosis of SARS-CoV-2 by FDA under an Emergency Use Authorization (EUA).  This EUA will remain in effect (meaning this test can be used) for the duration of the COVID-19 declaration under Section 564(b)(1) of the Act, 21 U.S.C. section 360bbb-3(b)(1), unless the authorization is terminated or revoked sooner. Performed at [facility], [place] [address] [place], [place] [address]   Culture, blood (routine x 2)     Status: Abnormal   Collection Time: 11/28/18  6:29 PM  Result Value Ref Range Status   Specimen Description BLOOD RIGHT ARM  Final   Special Requests   Final    BOTTLES DRAWN AEROBIC AND ANAEROBIC Blood Culture adequate volume   Culture  Setup Time   Final    GRAM POSITIVE COCCI IN BOTH AEROBIC AND ANAEROBIC BOTTLES CRITICAL RESULT CALLED TO, READ BACK BY AND VERIFIED WITHBronwen Betters [facility] 5364 11/29/18 A BROWNING Performed at [facility], [place] [address] [place], [place] [address]    Culture METHICILLIN RESISTANT STAPHYLOCOCCUS AUREUS (A)  Final   Report Status 12/01/2018 FINAL  Final   Organism ID, Bacteria METHICILLIN RESISTANT STAPHYLOCOCCUS AUREUS  Final      Susceptibility   Methicillin resistant staphylococcus aureus - MIC*    CIPROFLOXACIN >=8 RESISTANT Resistant     ERYTHROMYCIN >=8 RESISTANT Resistant     GENTAMICIN <=0.5 SENSITIVE Sensitive     OXACILLIN >=4 RESISTANT Resistant     TETRACYCLINE <=1 SENSITIVE Sensitive     VANCOMYCIN  <=0.5 SENSITIVE Sensitive     TRIMETH/SULFA <=10 SENSITIVE Sensitive     CLINDAMYCIN >=8 RESISTANT Resistant     RIFAMPIN <=0.5 SENSITIVE Sensitive     Inducible Clindamycin NEGATIVE Sensitive     * METHICILLIN RESISTANT STAPHYLOCOCCUS AUREUS  Blood Culture ID Panel (Reflexed)     Status: Abnormal   Collection Time: 11/28/18  6:29 PM  Result Value Ref Range Status   Enterococcus species NOT DETECTED NOT DETECTED Final   Listeria monocytogenes NOT DETECTED NOT DETECTED Final   Staphylococcus species DETECTED (A) NOT DETECTED Final    Comment: CRITICAL RESULT CALLED TO, READ BACK BY AND VERIFIED WITH: Bronwen Betters PHARMD 1612 11/29/18 A BROWNING    Staphylococcus aureus (BCID) DETECTED (A) NOT DETECTED Final    Comment: Methicillin (oxacillin)-resistant Staphylococcus aureus (MRSA). MRSA is predictably resistant to beta-lactam antibiotics (except ceftaroline). Preferred therapy is vancomycin unless clinically contraindicated. Patient requires contact precautions if  hospitalized. CRITICAL RESULT CALLED TO, READ BACK BY AND VERIFIED WITH: L CURRAN PHARMD 1224 11/29/18 A BROWNING    Methicillin resistance DETECTED (A) NOT DETECTED Final    Comment: CRITICAL RESULT CALLED TO, READ BACK BY AND VERIFIED WITH: L CURRAN PHARMD 1612 11/29/18 A BROWNING    Streptococcus species NOT DETECTED NOT DETECTED Final   Streptococcus agalactiae NOT DETECTED NOT DETECTED Final   Streptococcus pneumoniae NOT DETECTED NOT DETECTED Final   Streptococcus pyogenes NOT DETECTED NOT DETECTED Final   Acinetobacter baumannii NOT DETECTED NOT DETECTED Final   Enterobacteriaceae species NOT DETECTED NOT DETECTED Final   Enterobacter cloacae complex NOT DETECTED NOT DETECTED Final   Escherichia coli NOT DETECTED NOT DETECTED Final   Klebsiella oxytoca NOT DETECTED NOT DETECTED Final   Klebsiella pneumoniae NOT DETECTED NOT DETECTED Final   Proteus species NOT DETECTED NOT DETECTED Final  Serratia marcescens NOT  DETECTED NOT DETECTED Final   Haemophilus influenzae NOT DETECTED NOT DETECTED Final   Neisseria meningitidis NOT DETECTED NOT DETECTED Final   Pseudomonas aeruginosa NOT DETECTED NOT DETECTED Final   Candida albicans NOT DETECTED NOT DETECTED Final   Candida glabrata NOT DETECTED NOT DETECTED Final   Candida krusei NOT DETECTED NOT DETECTED Final   Candida parapsilosis NOT DETECTED NOT DETECTED Final   Candida tropicalis NOT DETECTED NOT DETECTED Final    Comment: Performed at [facility], [place] [address] [place], [place] [address]  Culture, blood (routine x 2)     Status: Abnormal   Collection Time: 11/28/18  7:29 PM  Result Value Ref Range Status   Specimen Description BLOOD LEFT ARM  Final   Special Requests   Final    BOTTLES DRAWN AEROBIC AND ANAEROBIC Blood Culture adequate volume   Culture  Setup Time   Final    GRAM POSITIVE COCCI IN BOTH AEROBIC AND ANAEROBIC BOTTLES CRITICAL VALUE NOTED.  VALUE IS CONSISTENT WITH PREVIOUSLY REPORTED AND CALLED VALUE.    Culture (A)  Final    STAPHYLOCOCCUS AUREUS SUSCEPTIBILITIES PERFORMED ON PREVIOUS CULTURE WITHIN THE LAST 5 DAYS. Performed at [facility], [place] [address] [place], [place] [address]    Report Status 12/01/2018 FINAL  Final  Culture, Urine     Status: None   Collection Time: 11/29/18 12:58 PM  Result Value Ref Range Status   Specimen Description URINE, CATHETERIZED  Final   Special Requests NONE  Final   Culture   Final    NO GROWTH Performed at [facility], [place] [address] [place], [place] [address]    Report Status 11/30/2018 FINAL  Final  Culture, blood (routine x 2)     Status: None (Preliminary result)   Collection Time: 11/30/18  3:22 PM  Result Value Ref Range Status   Specimen Description BLOOD LEFT ANTECUBITAL  Final   Special Requests   Final    BOTTLES DRAWN AEROBIC ONLY Blood Culture adequate volume   Culture   Final    NO GROWTH 3 DAYS Performed at [facility], [place] [address], [place], [place] [address]    Report Status PENDING  Incomplete  Culture, blood (routine x 2)     Status: None (Preliminary result)   Collection Time: 11/30/18  3:22 PM  Result Value Ref Range Status   Specimen Description BLOOD RIGHT HAND  Final   Special Requests   Final    BOTTLES DRAWN AEROBIC ONLY Blood Culture adequate volume   Culture   Final    NO GROWTH 3 DAYS Performed at [facility], [address], [place], [place] [address]    Report Status PENDING  Incomplete    Radiology Reports Vas [place] Abi With/wo Tbi  Result Date: 11/29/2018 LOWER EXTREMITY DOPPLER STUDY Indications: Diabetic foot infection. High Risk Factors: Diabetes.  Performing Technologist: Abram Sander RVS  Examination Guidelines: A complete evaluation includes at minimum, Doppler waveform signals and systolic blood pressure reading at the level of bilateral brachial, anterior tibial, and posterior tibial arteries, when vessel segments are accessible. Bilateral testing is considered an integral part of a complete examination. Photoelectric Plethysmograph (PPG) waveforms and toe systolic pressure readings are included as required and additional duplex testing as needed. Limited examinations for reoccurring indications may be performed as noted.  ABI Findings: +--------+------------------+-----+---------+--------+ Right   Rt Pressure (mmHg)IndexWaveform Comment  +--------+------------------+-----+---------+--------+ [id number]  triphasic         +--------+------------------+-----+---------+--------+ PTA     151               1.14 biphasic          +--------+------------------+-----+---------+--------+ DP      160               1.20 biphasic          +--------+------------------+-----+---------+--------+ +--------+------------------+-----+---------+-------+ Left    Lt Pressure (mmHg)IndexWaveform Comment +--------+------------------+-----+---------+-------+  [id number]                    triphasic        +--------+------------------+-----+---------+-------+ PTA     147               1.11 triphasic        +--------+------------------+-----+---------+-------+ DP      148               1.11 triphasic        +--------+------------------+-----+---------+-------+ +-------+-----------+-----------+------------+------------+ ABI/TBIToday's ABIToday's TBIPrevious ABIPrevious TBI +-------+-----------+-----------+------------+------------+ Right  1.20                                           +-------+-----------+-----------+------------+------------+ Left   1.11                                           +-------+-----------+-----------+------------+------------+  Summary: Right: Resting right ankle-brachial index is within normal range. No evidence of significant right lower extremity arterial disease. Left: Resting left ankle-brachial index is within normal range. No evidence of significant left lower extremity arterial disease.  *See table(s) above for measurements and observations.  Electronically signed by Monica Martinez MD on 11/29/2018 at 5:28:23 PM.   Final    Xr Ankle Complete Right  Result Date: 11/08/2018 3 view radiographs of the right ankle shows a congruent mortise with no destructive changes of the fibula.  Patient has Charcot collapse through the talar neck with peripheral vascular disease and calcification of the arteries down to the ankle.  [place] Ekg Site Rite  Result Date: 12/03/2018 If [facility] image not attached, placement could not be confirmed due to current cardiac rhythm.   Lab Data:  CBC: Recent Labs  Lab 11/28/18 1912 11/29/18 0328 11/30/18 0319 12/01/18 0301 12/02/18 0258  WBC 21.3* 20.2* 21.8* 25.3* 21.0*  HGB 9.5* 8.9* 8.2* 7.6* 7.6*  HCT 29.0* 26.4* 24.2* 22.8* 22.9*  MCV 91.5 90.4 90.3 90.8 91.6  PLT 405* 363 356 334 196   Basic Metabolic Panel: Recent Labs  Lab 11/28/18 1912 11/29/18  0328 11/30/18 0319 12/01/18 0301 12/02/18 0258  NA 134* 135 137 133* 137  K 4.1 4.0 3.4* 3.8 3.5  CL 103 106 107 107 108  CO2 _0 21* 24  GLUCOSE 232* 247* 93 341* 119*  BUN _1 CREATININE 0.89 0.97 0.91 0.76 0.65  CALCIUM 10.5* 9.7  9.6 9.2 8.3* 7.9*   GFR: Estimated Creatinine Clearance: 97.8 mL/min (by C-G formula based on SCr of 0.65 mg/dL). Liver Function Tests: Recent Labs  Lab 11/28/18 1912 11/29/18 0328  AST 33 32  ALT 22 20  ALKPHOS 287* 289*  BILITOT 3.0* 3.3*  PROT 8.6* 7.5  ALBUMIN 1.4* 1.2*  No results for input(s): LIPASE, AMYLASE in the last 168 hours. No results for input(s): AMMONIA in the last 168 hours. Coagulation Profile: No results for input(s): INR, PROTIME in the last 168 hours. Cardiac Enzymes: No results for input(s): CKTOTAL, CKMB, CKMBINDEX, TROPONINI in the last 168 hours. BNP (last 3 results) No results for input(s): PROBNP in the last 8760 hours. HbA1C: No results for input(s): HGBA1C in the last 72 hours. CBG: Recent Labs  Lab 12/02/18 1804 12/02/18 2133 12/03/18 0813 12/03/18 0912 12/03/18 1207  GLUCAP 242* 105* 67* 82 159*   Lipid Profile: No results for input(s): CHOL, HDL, LDLCALC, TRIG, CHOLHDL, LDLDIRECT in the last 72 hours. Thyroid Function Tests: No results for input(s): TSH, T4TOTAL, FREET4, T3FREE, THYROIDAB in the last 72 hours. Anemia Panel: No results for input(s): VITAMINB12, FOLATE, FERRITIN, TIBC, IRON, RETICCTPCT in the last 72 hours. Urine analysis:    Component Value Date/Time   COLORURINE YELLOW 11/29/2018 [place] 11/29/2018 0653   LABSPEC 1.010 11/29/2018 0653   PHURINE 5.5 11/29/2018 0653   GLUCOSEU NEGATIVE 11/29/2018 0653   HGBUR TRACE (A) 11/29/2018 0653   BILIRUBINUR SMALL (A) 11/29/2018 0653   KETONESUR NEGATIVE 11/29/2018 0653   PROTEINUR NEGATIVE 11/29/2018 0653   NITRITE NEGATIVE 11/29/2018 0653   LEUKOCYTESUR NEGATIVE 11/29/2018 0653     Ananias Kolander M.D. [facility] 12/03/2018, 1:53 PM  Pager: [phone] Between 7am to 7pm - call Pager - [phone]  After 7pm go to [email or web address] - password [email or web address]  Call night coverage person covering after 7pm

## 2018-12-03 NOTE — Progress Notes (Signed)
[facility]                                                                              Patient Demographics  Yolanda [name], is a 43 y.o. female, DOB - 1975/09/30, LGX:[id number]  Admit date - 11/28/2018   Admitting Physician Vianne Bulls, MD  Outpatient Primary MD for the patient is System, Pcp Not In  Outpatient specialists:   LOS - 5  days   Medical records reviewed and are as summarized below:    No chief complaint on file.      Brief summary   Patient is a 43 year old female with diabetes type 2, diabetic foot ulcer, osteomyelitis and cellulitis with MRSA bacteremia, presumed endocarditis presented with right lower extremity pain, swelling of her right lower extremity.  She was recently discharged in April for MRSA bacteremia with presumed endocarditis, failed daptomycin and was transitioned to vancomycin (supposed to complete on 11/20/2018).  Patient was seen at [facility] rocking him, CT findings concerning for destruction of the entire mid and hindfoot with fluid and gas collections, surrounding cellulitis. Patient was admitted for further work-up  Assessment & Plan    Principal Problem: Sepsis with right foot infection, osteomyelitis -Patient met sepsis criteria at the time of admission, initially was tachycardia to heart rate 80s, SBP in 80s, lactate 5.7, source right foot osteomyelitis -Blood cultures negative so far.  Patient was placed on IV vancomycin and cefepime, metronidazole.  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  CIR consult placed   Active Problems: Recent MRSA osteomyelitis, bacteremia, presumed endocarditis -Infectious disease following, plan for TEE 5/26, n.p.o. after midnight -Patient was placed on IV vancomycin, cefepime and Flagyl -Per ID recommendations, DC cefepime and Flagyl and narrow antibiotics to vancomycin    Diabetes mellitus type 2, uncontrolled,  with complications, foot infection (HCC) -CBGs lower earlier this morning, will decrease Lantus to 30units BID -Continue sliding scale insulin and meal coverage   Essential hypertension BP stable  Right lower extremity DVT -Diagnosed in 07/2018, used to be on Xarelto, she completed the starter pack however Xarelto was discontinued due to bleeding issues at [facility] in 09/2018.  Right lower extremity Dopplers on 3/29 did not show any DVT hence Xarelto was not resumed.  Hypercalcemia -Likely due to sepsis, dehydration, calcium 10.5 with a time of admission -Resolved  Severe protein calorie malnutrition -Albumin 1.2 -Placed on pro-stat, nutritional supplements  Acute on chronic anemia  -H&H currently stable baseline 9-10 -Hemoglobin 7.4 at OSH, transfused 1 unit packed RBC,  -H&H stable, CBC in a.m.  Urinary retention -UA negative for UTI  - Flomax for 7 days  Code Status: Full code DVT Prophylaxis: SCDs, will place on heparin subcu after surgery Family Communication: Discussed in detail with the patient, all imaging results, lab results explained to the patient    Disposition Plan: Plan for TEE on 5/26, then inpatient rehab post BKA  Time Spent in minutes 25 minutes  Procedures:  Transtibial amputation 5/22  Consultants:   Orthopedics, Dr. Sharol Given Infectious disease CIR  Antimicrobials:   Anti-infectives (From admission, onward)  Start     Dose/Rate Route Frequency Ordered Stop   12/01/18 1330  vancomycin (VANCOCIN) 1,250 mg in sodium chloride 0.9 % 250 mL IVPB     1,250 mg 166.7 mL/hr over 90 Minutes Intravenous Every 12 hours 12/01/18 1310     11/30/18 1500  ceFEPIme (MAXIPIME) 2 g in sodium chloride 0.9 % 100 mL IVPB  Status:  Discontinued     2 g 200 mL/hr over 30 Minutes Intravenous Every 8 hours 11/30/18 1416 12/03/18 1254   11/30/18 1500  metroNIDAZOLE (FLAGYL) tablet 500 mg  Status:  Discontinued     500 mg Oral Every 8 hours 11/30/18 1416  12/03/18 1254   11/30/18 1415  metroNIDAZOLE (FLAGYL) IVPB 500 mg  Status:  Discontinued     500 mg 100 mL/hr over 60 Minutes Intravenous Every 8 hours 11/30/18 1411 11/30/18 1416   11/28/18 2045  vancomycin (VANCOCIN) 1,750 mg in sodium chloride 0.9 % 500 mL IVPB  Status:  Discontinued     1,750 mg 250 mL/hr over 120 Minutes Intravenous Every 24 hours 11/28/18 2032 12/01/18 1310   11/28/18 2030  vancomycin (VANCOCIN) 1,250 mg in sodium chloride 0.9 % 250 mL IVPB  Status:  Discontinued     1,250 mg 166.7 mL/hr over 90 Minutes Intravenous Every 24 hours 11/28/18 2004 11/28/18 2032   11/28/18 1900  ceFEPIme (MAXIPIME) 2 g in sodium chloride 0.9 % 100 mL IVPB  Status:  Discontinued     2 g 200 mL/hr over 30 Minutes Intravenous Every 8 hours 11/28/18 1848 11/30/18 1304   11/28/18 1815  metroNIDAZOLE (FLAGYL) tablet 500 mg  Status:  Discontinued     500 mg Oral Every 8 hours 11/28/18 1803 11/30/18 1304         Medications  Scheduled Meds: . docusate sodium  100 mg Oral BID  . insulin aspart  0-15 Units Subcutaneous TID WC  . insulin aspart  0-5 Units Subcutaneous QHS  . insulin aspart  6 Units Subcutaneous TID WC  . insulin glargine  35 Units Subcutaneous BID  . multivitamin with minerals  1 tablet Oral Daily  . Ensure Max Protein  11 oz Oral Daily  . tamsulosin  0.4 mg Oral QPC breakfast   Continuous Infusions: . sodium chloride 250 mL (11/30/18 0650)  . sodium chloride Stopped (11/30/18 1636)  . lactated ringers Stopped (11/30/18 1330)  . methocarbamol (ROBAXIN) IV    . vancomycin 1,250 mg (12/03/18 1136)   PRN Meds:.sodium chloride, acetaminophen **OR** acetaminophen, bisacodyl, HYDROmorphone (DILAUDID) injection, magnesium citrate, methocarbamol **OR** methocarbamol (ROBAXIN) IV, metoCLOPramide **OR** metoCLOPramide (REGLAN) injection, ondansetron **OR** ondansetron (ZOFRAN) IV, oxyCODONE, oxyCODONE, polyethylene glycol      Subjective:   Yolanda [name] was seen and  examined today.  No acute complaints, hoping to go home soon.  No fevers or chills. Patient denies dizziness, chest pain, shortness of breath, abdominal pain, N/V/D/C.   Objective:   Vitals:   12/03/18 0422 12/03/18 0514 12/03/18 0641 12/03/18 1340  BP:  119/90  (!) 136/94  Pulse:  (!) 103  (!) 104  Resp:  20  (!) 26  Temp: 98.5 F (36.9 C)   98 F (36.7 C)  TempSrc:    Oral  SpO2:  96%  100%  Weight:   80 kg   Height:        Intake/Output Summary (Last 24 hours) at 12/03/2018 1353 Last data filed at 12/03/2018 0745 Gross per 24 hour  Intake 240 ml  Output 800 ml  Net -560 ml     Wt Readings from Last 3 Encounters:  12/03/18 80 kg  11/08/18 77.1 kg  10/24/18 77.1 kg    Physical Exam  General: Alert and oriented x 3, NAD  Eyes:   HEENT:  Atraumatic, normocephalic  Cardiovascular: S1 S2 clear, RRR.  Respiratory: CTAB, no wheezing, rales or rhonchi  Gastrointestinal: Soft, nontender, nondistended, NBS  Ext: Right BKA  Neuro: no new deficits  Musculoskeletal: No cyanosis, clubbing  Skin: No rashes  Psych: Normal affect and demeanor, alert and oriented x3    Data Reviewed:  I have personally reviewed following labs and imaging studies  Micro Results Recent Results (from the past 240 hour(s))  MRSA PCR Screening     Status: Abnormal   Collection Time: 11/28/18  5:30 PM  Result Value Ref Range Status   MRSA by PCR POSITIVE (A) NEGATIVE Final    Comment:        The GeneXpert MRSA Assay (FDA approved for NASAL specimens only), is one component of a comprehensive MRSA colonization surveillance program. It is not intended to diagnose MRSA infection nor to guide or monitor treatment for MRSA infections. RESULT CALLED TO, READ BACK BY AND VERIFIED WITHJefferson Fuel RN 11/28/18 1928 JDW Performed at [facility], [address], [place], [place] [address]   SARS Coronavirus 2 [facility] order, Performed in [facility] hospital lab)     Status: None    Collection Time: 11/28/18  6:29 PM  Result Value Ref Range Status   SARS Coronavirus 2 NEGATIVE NEGATIVE Final    Comment: (NOTE) If result is NEGATIVE SARS-CoV-2 target nucleic acids are NOT DETECTED. The SARS-CoV-2 RNA is generally detectable in upper and lower  respiratory specimens during the acute phase of infection. The lowest  concentration of SARS-CoV-2 viral copies this assay can detect is 250  copies / mL. A negative result does not preclude SARS-CoV-2 infection  and should not be used as the sole basis for treatment or other  patient management decisions.  A negative result may occur with  improper specimen collection / handling, submission of specimen other  than nasopharyngeal swab, presence of viral mutation(s) within the  areas targeted by this assay, and inadequate number of viral copies  (<250 copies / mL). A negative result must be combined with clinical  observations, patient history, and epidemiological information. If result is POSITIVE SARS-CoV-2 target nucleic acids are DETECTED. The SARS-CoV-2 RNA is generally detectable in upper and lower  respiratory specimens dur ing the acute phase of infection.  Positive  results are indicative of active infection with SARS-CoV-2.  Clinical  correlation with patient history and other diagnostic information is  necessary to determine patient infection status.  Positive results do  not rule out bacterial infection or co-infection with other viruses. If result is PRESUMPTIVE POSTIVE SARS-CoV-2 nucleic acids MAY BE PRESENT.   A presumptive positive result was obtained on the submitted specimen  and confirmed on repeat testing.  While 2019 novel coronavirus  (SARS-CoV-2) nucleic acids may be present in the submitted sample  additional confirmatory testing may be necessary for epidemiological  and / or clinical management purposes  to differentiate between  SARS-CoV-2 and other Sarbecovirus currently known to infect humans.   If clinically indicated additional testing with an alternate test  methodology [phone]) is advised. The SARS-CoV-2 RNA is generally  detectable in upper and lower respiratory sp ecimens during the acute  phase of infection. The expected result is Negative.  Fact Sheet for Patients:  StrictlyIdeas.no Fact Sheet for Healthcare Providers: BankingDealers.co.za This test is not yet approved or cleared by the [place] FDA and has been authorized for detection and/or diagnosis of SARS-CoV-2 by FDA under an Emergency Use Authorization (EUA).  This EUA will remain in effect (meaning this test can be used) for the duration of the COVID-19 declaration under Section 564(b)(1) of the Act, 21 U.S.C. section 360bbb-3(b)(1), unless the authorization is terminated or revoked sooner. Performed at [facility], [place] [address] [place], [place] [address]   Culture, blood (routine x 2)     Status: Abnormal   Collection Time: 11/28/18  6:29 PM  Result Value Ref Range Status   Specimen Description BLOOD RIGHT ARM  Final   Special Requests   Final    BOTTLES DRAWN AEROBIC AND ANAEROBIC Blood Culture adequate volume   Culture  Setup Time   Final    GRAM POSITIVE COCCI IN BOTH AEROBIC AND ANAEROBIC BOTTLES CRITICAL RESULT CALLED TO, READ BACK BY AND VERIFIED WITHBronwen Betters [facility] 5364 11/29/18 A BROWNING Performed at [facility], [place] [address] [place], [place] [address]    Culture METHICILLIN RESISTANT STAPHYLOCOCCUS AUREUS (A)  Final   Report Status 12/01/2018 FINAL  Final   Organism ID, Bacteria METHICILLIN RESISTANT STAPHYLOCOCCUS AUREUS  Final      Susceptibility   Methicillin resistant staphylococcus aureus - MIC*    CIPROFLOXACIN >=8 RESISTANT Resistant     ERYTHROMYCIN >=8 RESISTANT Resistant     GENTAMICIN <=0.5 SENSITIVE Sensitive     OXACILLIN >=4 RESISTANT Resistant     TETRACYCLINE <=1 SENSITIVE Sensitive     VANCOMYCIN  <=0.5 SENSITIVE Sensitive     TRIMETH/SULFA <=10 SENSITIVE Sensitive     CLINDAMYCIN >=8 RESISTANT Resistant     RIFAMPIN <=0.5 SENSITIVE Sensitive     Inducible Clindamycin NEGATIVE Sensitive     * METHICILLIN RESISTANT STAPHYLOCOCCUS AUREUS  Blood Culture ID Panel (Reflexed)     Status: Abnormal   Collection Time: 11/28/18  6:29 PM  Result Value Ref Range Status   Enterococcus species NOT DETECTED NOT DETECTED Final   Listeria monocytogenes NOT DETECTED NOT DETECTED Final   Staphylococcus species DETECTED (A) NOT DETECTED Final    Comment: CRITICAL RESULT CALLED TO, READ BACK BY AND VERIFIED WITH: Bronwen Betters PHARMD 1612 11/29/18 A BROWNING    Staphylococcus aureus (BCID) DETECTED (A) NOT DETECTED Final    Comment: Methicillin (oxacillin)-resistant Staphylococcus aureus (MRSA). MRSA is predictably resistant to beta-lactam antibiotics (except ceftaroline). Preferred therapy is vancomycin unless clinically contraindicated. Patient requires contact precautions if  hospitalized. CRITICAL RESULT CALLED TO, READ BACK BY AND VERIFIED WITH: L CURRAN PHARMD 1224 11/29/18 A BROWNING    Methicillin resistance DETECTED (A) NOT DETECTED Final    Comment: CRITICAL RESULT CALLED TO, READ BACK BY AND VERIFIED WITH: L CURRAN PHARMD 1612 11/29/18 A BROWNING    Streptococcus species NOT DETECTED NOT DETECTED Final   Streptococcus agalactiae NOT DETECTED NOT DETECTED Final   Streptococcus pneumoniae NOT DETECTED NOT DETECTED Final   Streptococcus pyogenes NOT DETECTED NOT DETECTED Final   Acinetobacter baumannii NOT DETECTED NOT DETECTED Final   Enterobacteriaceae species NOT DETECTED NOT DETECTED Final   Enterobacter cloacae complex NOT DETECTED NOT DETECTED Final   Escherichia coli NOT DETECTED NOT DETECTED Final   Klebsiella oxytoca NOT DETECTED NOT DETECTED Final   Klebsiella pneumoniae NOT DETECTED NOT DETECTED Final   Proteus species NOT DETECTED NOT DETECTED Final  Serratia marcescens NOT  DETECTED NOT DETECTED Final   Haemophilus influenzae NOT DETECTED NOT DETECTED Final   Neisseria meningitidis NOT DETECTED NOT DETECTED Final   Pseudomonas aeruginosa NOT DETECTED NOT DETECTED Final   Candida albicans NOT DETECTED NOT DETECTED Final   Candida glabrata NOT DETECTED NOT DETECTED Final   Candida krusei NOT DETECTED NOT DETECTED Final   Candida parapsilosis NOT DETECTED NOT DETECTED Final   Candida tropicalis NOT DETECTED NOT DETECTED Final    Comment: Performed at [facility], [place] [address] [place], [place] [address]  Culture, blood (routine x 2)     Status: Abnormal   Collection Time: 11/28/18  7:29 PM  Result Value Ref Range Status   Specimen Description BLOOD LEFT ARM  Final   Special Requests   Final    BOTTLES DRAWN AEROBIC AND ANAEROBIC Blood Culture adequate volume   Culture  Setup Time   Final    GRAM POSITIVE COCCI IN BOTH AEROBIC AND ANAEROBIC BOTTLES CRITICAL VALUE NOTED.  VALUE IS CONSISTENT WITH PREVIOUSLY REPORTED AND CALLED VALUE.    Culture (A)  Final    STAPHYLOCOCCUS AUREUS SUSCEPTIBILITIES PERFORMED ON PREVIOUS CULTURE WITHIN THE LAST 5 DAYS. Performed at [facility], [place] [address] [place], [place] [address]    Report Status 12/01/2018 FINAL  Final  Culture, Urine     Status: None   Collection Time: 11/29/18 12:58 PM  Result Value Ref Range Status   Specimen Description URINE, CATHETERIZED  Final   Special Requests NONE  Final   Culture   Final    NO GROWTH Performed at [facility], [place] [address] [place], [place] [address]    Report Status 11/30/2018 FINAL  Final  Culture, blood (routine x 2)     Status: None (Preliminary result)   Collection Time: 11/30/18  3:22 PM  Result Value Ref Range Status   Specimen Description BLOOD LEFT ANTECUBITAL  Final   Special Requests   Final    BOTTLES DRAWN AEROBIC ONLY Blood Culture adequate volume   Culture   Final    NO GROWTH 3 DAYS Performed at [facility], [place] [address], [place], [place] [address]    Report Status PENDING  Incomplete  Culture, blood (routine x 2)     Status: None (Preliminary result)   Collection Time: 11/30/18  3:22 PM  Result Value Ref Range Status   Specimen Description BLOOD RIGHT HAND  Final   Special Requests   Final    BOTTLES DRAWN AEROBIC ONLY Blood Culture adequate volume   Culture   Final    NO GROWTH 3 DAYS Performed at [facility], [address], [place], [place] [address]    Report Status PENDING  Incomplete    Radiology Reports Vas [place] Abi With/wo Tbi  Result Date: 11/29/2018 LOWER EXTREMITY DOPPLER STUDY Indications: Diabetic foot infection. High Risk Factors: Diabetes.  Performing Technologist: Abram Sander RVS  Examination Guidelines: A complete evaluation includes at minimum, Doppler waveform signals and systolic blood pressure reading at the level of bilateral brachial, anterior tibial, and posterior tibial arteries, when vessel segments are accessible. Bilateral testing is considered an integral part of a complete examination. Photoelectric Plethysmograph (PPG) waveforms and toe systolic pressure readings are included as required and additional duplex testing as needed. Limited examinations for reoccurring indications may be performed as noted.  ABI Findings: +--------+------------------+-----+---------+--------+ Right   Rt Pressure (mmHg)IndexWaveform Comment  +--------+------------------+-----+---------+--------+ [id number]  triphasic         +--------+------------------+-----+---------+--------+ PTA     151               1.14 biphasic          +--------+------------------+-----+---------+--------+ DP      160               1.20 biphasic          +--------+------------------+-----+---------+--------+ +--------+------------------+-----+---------+-------+ Left    Lt Pressure (mmHg)IndexWaveform Comment +--------+------------------+-----+---------+-------+  [id number]                    triphasic        +--------+------------------+-----+---------+-------+ PTA     147               1.11 triphasic        +--------+------------------+-----+---------+-------+ DP      148               1.11 triphasic        +--------+------------------+-----+---------+-------+ +-------+-----------+-----------+------------+------------+ ABI/TBIToday's ABIToday's TBIPrevious ABIPrevious TBI +-------+-----------+-----------+------------+------------+ Right  1.20                                           +-------+-----------+-----------+------------+------------+ Left   1.11                                           +-------+-----------+-----------+------------+------------+  Summary: Right: Resting right ankle-brachial index is within normal range. No evidence of significant right lower extremity arterial disease. Left: Resting left ankle-brachial index is within normal range. No evidence of significant left lower extremity arterial disease.  *See table(s) above for measurements and observations.  Electronically signed by Monica Martinez MD on 11/29/2018 at 5:28:23 PM.   Final    Xr Ankle Complete Right  Result Date: 11/08/2018 3 view radiographs of the right ankle shows a congruent mortise with no destructive changes of the fibula.  Patient has Charcot collapse through the talar neck with peripheral vascular disease and calcification of the arteries down to the ankle.  [place] Ekg Site Rite  Result Date: 12/03/2018 If [facility] image not attached, placement could not be confirmed due to current cardiac rhythm.   Lab Data:  CBC: Recent Labs  Lab 11/28/18 1912 11/29/18 0328 11/30/18 0319 12/01/18 0301 12/02/18 0258  WBC 21.3* 20.2* 21.8* 25.3* 21.0*  HGB 9.5* 8.9* 8.2* 7.6* 7.6*  HCT 29.0* 26.4* 24.2* 22.8* 22.9*  MCV 91.5 90.4 90.3 90.8 91.6  PLT 405* 363 356 334 196   Basic Metabolic Panel: Recent Labs  Lab 11/28/18 1912 11/29/18  0328 11/30/18 0319 12/01/18 0301 12/02/18 0258  NA 134* 135 137 133* 137  K 4.1 4.0 3.4* 3.8 3.5  CL 103 106 107 107 108  CO2 _0 21* 24  GLUCOSE 232* 247* 93 341* 119*  BUN _1 CREATININE 0.89 0.97 0.91 0.76 0.65  CALCIUM 10.5* 9.7  9.6 9.2 8.3* 7.9*   GFR: Estimated Creatinine Clearance: 97.8 mL/min (by C-G formula based on SCr of 0.65 mg/dL). Liver Function Tests: Recent Labs  Lab 11/28/18 1912 11/29/18 0328  AST 33 32  ALT 22 20  ALKPHOS 287* 289*  BILITOT 3.0* 3.3*  PROT 8.6* 7.5  ALBUMIN 1.4* 1.2*  No results for input(s): LIPASE, AMYLASE in the last 168 hours. No results for input(s): AMMONIA in the last 168 hours. Coagulation Profile: No results for input(s): INR, PROTIME in the last 168 hours. Cardiac Enzymes: No results for input(s): CKTOTAL, CKMB, CKMBINDEX, TROPONINI in the last 168 hours. BNP (last 3 results) No results for input(s): PROBNP in the last 8760 hours. HbA1C: No results for input(s): HGBA1C in the last 72 hours. CBG: Recent Labs  Lab 12/02/18 1804 12/02/18 2133 12/03/18 0813 12/03/18 0912 12/03/18 1207  GLUCAP 242* 105* 67* 82 159*   Lipid Profile: No results for input(s): CHOL, HDL, LDLCALC, TRIG, CHOLHDL, LDLDIRECT in the last 72 hours. Thyroid Function Tests: No results for input(s): TSH, T4TOTAL, FREET4, T3FREE, THYROIDAB in the last 72 hours. Anemia Panel: No results for input(s): VITAMINB12, FOLATE, FERRITIN, TIBC, IRON, RETICCTPCT in the last 72 hours. Urine analysis:    Component Value Date/Time   COLORURINE YELLOW 11/29/2018 [place] 11/29/2018 0653   LABSPEC 1.010 11/29/2018 0653   PHURINE 5.5 11/29/2018 0653   GLUCOSEU NEGATIVE 11/29/2018 0653   HGBUR TRACE (A) 11/29/2018 0653   BILIRUBINUR SMALL (A) 11/29/2018 0653   KETONESUR NEGATIVE 11/29/2018 0653   PROTEINUR NEGATIVE 11/29/2018 0653   NITRITE NEGATIVE 11/29/2018 0653   LEUKOCYTESUR NEGATIVE 11/29/2018 0653     Amandeep Nesmith M.D. [facility] 12/03/2018, 1:53 PM  Pager: [phone] Between 7am to 7pm - call Pager - [phone]  After 7pm go to [email or web address] - password [email or web address]  Call night coverage person covering after 7pm

## 2018-12-03 NOTE — Progress Notes (Signed)
Inpatient Rehab Admissions:  Inpatient Rehab Consult received.  I met with pt at the bedside for rehabilitation assessment and to discuss goals and expectations of an inpatient rehab admission. Pt has agreed to participate in CIR. AC has confirmed support at DC. Noted pt has possible procedure tomorrow on 5/26. AC will follow for medical readiness and insurance authorization.   Jhonnie Garner, OTR/L  Rehab Admissions Coordinator  [phone] 12/03/2018 10:49 AM

## 2018-12-03 NOTE — Progress Notes (Signed)
Inpatient Diabetes Program Recommendations  AACE/ADA: New Consensus Statement on Inpatient Glycemic Control (2015)  Target Ranges:  Prepandial:   less than 140 mg/dL      Peak postprandial:   less than 180 mg/dL (1-2 hours)      Critically ill patients:  140 - 180 mg/dL   Lab Results  Component Value Date   GLUCAP 82 12/03/2018   HGBA1C 7.9 (H) 11/28/2018  Results for MARKEA, HAPP (MRN [id number]) as of 12/03/2018 10:15  Ref. Range 12/02/2018 21:33 12/03/2018 08:13 12/03/2018 09:12  Glucose-Capillary Latest Ref Range: 70 - 99 mg/dL 735 (H) 67 (L) 82   Diabetes history:DM 2 Outpatient Diabetes medications:Humalog 30 units bid, Tresiba 70 units bid Current orders for Inpatient glycemic control: Novolog moderate tid with meals and HS Novolog 6 units tid with meals Lantus 35 units bid  Inpatient Diabetes Program Recommendations:  Mild hypoglycemia this of 67 mg/dL.  Consider decreasing QHS dose of Lantus to 30 units.   Thanks, Lujean Rave, MSN, RNC-OB Diabetes Coordinator [phone] (8a-5p)

## 2018-12-03 NOTE — Progress Notes (Signed)
Physical Therapy Treatment Patient Details Name: Yolanda [name] MRN: [id number] DOB: 30-Oct-1975 Today's Date: 12/03/2018    History of Present Illness Pt is a 43 y.o. F with significant PMH of diabetes type 2, diabetic foot ulcer, who presents with sepsis with right foot infection, osteomyelitis and MRSA bacteremia and presumed endocarditis. 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    PT Comments    Pt progressing well towards goals. Practiced lateral scooting along EOB to prepare for transfers with min to mod A. Also performed supine HEP with LLE this session. Feel pt is excellent candidate for CIR as she is very motivated to regain independence. Will continue to follow acutely to maximize functional mobility independence and safety.    Follow Up Recommendations  CIR     Equipment Recommendations  Wheelchair (measurements PT);Wheelchair cushion (measurements PT)    Recommendations for Other Services       Precautions / Restrictions Precautions Precautions: Fall Precaution Comments: R BKA, wound vac Required Braces or Orthoses: Other Brace Other Brace: R residual limb guard Restrictions Weight Bearing Restrictions: Yes RLE Weight Bearing: Non weight bearing    Mobility  Bed Mobility Overal bed mobility: Needs Assistance Bed Mobility: Supine to Sit;Sit to Supine Rolling: Min assist   Supine to sit: Mod assist Sit to supine: Supervision   General bed mobility comments: Mod A for trunk elevation and assist with scooting hips towards EOB. Supervision to return to supine. Required min A to roll from side to side for removal of bed pain and clean up following BM.  Transfers Overall transfer level: Needs assistance Equipment used: None Transfers: Lateral/Scoot Transfers          Lateral/Scoot Transfers: Min assist;Mod assist General transfer comment: Performed lateral scooting along EOB to practice for lateral scoot transfers. Required min to mod A for assist with scooting  and required cues for technique.   Ambulation/Gait                 Stairs             Wheelchair Mobility    Modified Rankin (Stroke Patients Only)       Balance Overall balance assessment: Needs assistance Sitting-balance support: Bilateral upper extremity supported Sitting balance-Leahy Scale: Good                                      Cognition Arousal/Alertness: Awake/alert Behavior During Therapy: WFL for tasks assessed/performed Overall Cognitive Status: Impaired/Different from baseline Area of Impairment: Problem solving                             Problem Solving: Requires verbal cues        Exercises Amputee Exercises Quad Sets: AROM;Right;10 reps;Supine Gluteal Sets: AROM;Both;10 reps;Supine Hip ABduction/ADduction: AROM;Right;10 reps;Supine Straight Leg Raises: AROM;Right;10 reps;Supine    General Comments        Pertinent Vitals/Pain Pain Assessment: No/denies pain    Home Living                      Prior Function            PT Goals (current goals can now be found in the care plan section) Acute Rehab PT Goals Patient Stated Goal: "get a prosthetic." PT Goal Formulation: With patient Time For Goal Achievement: 12/15/18 Potential to Achieve Goals: Good Progress  towards PT goals: Progressing toward goals    Frequency    Min 3X/week      PT Plan Current plan remains appropriate    Co-evaluation              AM-PAC PT "6 Clicks" Mobility   Outcome Measure  Help needed turning from your back to your side while in a flat bed without using bedrails?: A Little Help needed moving from lying on your back to sitting on the side of a flat bed without using bedrails?: A Lot Help needed moving to and from a bed to a chair (including a wheelchair)?: A Lot Help needed standing up from a chair using your arms (e.g., wheelchair or bedside chair)?: Total Help needed to walk in hospital  room?: Total Help needed climbing 3-5 steps with a railing? : Total 6 Click Score: 10    End of Session   Activity Tolerance: Patient tolerated treatment well Patient left: in bed;with call bell/phone within reach;with bed alarm set Nurse Communication: Mobility status PT Visit Diagnosis: Other abnormalities of gait and mobility (R26.89)     Time: 6754-4920 PT Time Calculation (min) (ACUTE ONLY): 21 min  Charges:  $Therapeutic Activity: 8-22 mins                     Gladys Damme, PT, DPT  Acute Rehabilitation Services  Pager: [phone] Office: [phone]    Lehman Prom 12/03/2018, 1:24 PM

## 2018-12-04 ENCOUNTER — Inpatient Hospital Stay (HOSPITAL_COMMUNITY): Payer: Medicaid Other

## 2018-12-04 ENCOUNTER — Encounter (HOSPITAL_COMMUNITY): Payer: Self-pay | Admitting: *Deleted

## 2018-12-04 ENCOUNTER — Encounter (HOSPITAL_COMMUNITY)
Admission: AD | Disposition: A | Discharge status: Home Health | Payer: Self-pay | Source: Other Acute Inpatient Hospital | Attending: Internal Medicine

## 2018-12-04 DIAGNOSIS — I340 Nonrheumatic mitral (valve) insufficiency: Secondary | ICD-10-CM

## 2018-12-04 HISTORY — PX: TEE WITHOUT CARDIOVERSION: SHX5443

## 2018-12-04 LAB — CBC
HCT: 23.7 % — ABNORMAL LOW (ref 36.0–46.0)
Hemoglobin: 7.7 g/dL — ABNORMAL LOW (ref 12.0–15.0)
MCH: 30.3 pg (ref 26.0–34.0)
MCHC: 32.5 g/dL (ref 30.0–36.0)
MCV: 93.3 fL (ref 80.0–100.0)
Platelets: 366 10*3/uL (ref 150–400)
RBC: 2.54 MIL/uL — ABNORMAL LOW (ref 3.87–5.11)
RDW: 14.7 % (ref 11.5–15.5)
WBC: 22 10*3/uL — ABNORMAL HIGH (ref 4.0–10.5)
nRBC: 0.2 % (ref 0.0–0.2)

## 2018-12-04 LAB — BASIC METABOLIC PANEL
Anion gap: 5 (ref 5–15)
BUN: 9 mg/dL (ref 6–20)
CO2: 24 mmol/L (ref 22–32)
Calcium: 7.3 mg/dL — ABNORMAL LOW (ref 8.9–10.3)
Chloride: 110 mmol/L (ref 98–111)
Creatinine, Ser: 0.51 mg/dL (ref 0.44–1.00)
GFR calc Af Amer: >60 mL/min (ref >60)
GFR calc non Af Amer: >60 mL/min (ref >60)
Glucose, Bld: 78 mg/dL (ref 70–99)
Potassium: 3.4 mmol/L — ABNORMAL LOW (ref 3.5–5.1)
Sodium: 139 mmol/L (ref 135–145)

## 2018-12-04 LAB — URIC ACID: Uric Acid, Serum: 3.2 mg/dL (ref 2.5–7.1)

## 2018-12-04 LAB — GLUCOSE, CAPILLARY
Glucose-Capillary: 88 mg/dL (ref 70–99)
Glucose-Capillary: 51 mg/dL — ABNORMAL LOW (ref 70–99)
Glucose-Capillary: 77 mg/dL (ref 70–99)
Glucose-Capillary: 129 mg/dL — ABNORMAL HIGH (ref 70–99)
Glucose-Capillary: 160 mg/dL — ABNORMAL HIGH (ref 70–99)
Glucose-Capillary: 159 mg/dL — ABNORMAL HIGH (ref 70–99)

## 2018-12-04 SURGERY — ECHOCARDIOGRAM, TRANSESOPHAGEAL
Anesthesia: Moderate Sedation

## 2018-12-04 MED ORDER — INSULIN GLARGINE 100 UNIT/ML ~~LOC~~ SOLN
20.0000 [IU] | Freq: Two times a day (BID) | SUBCUTANEOUS | Status: DC
  Administered 2018-12-04 – 2018-12-06 (×4): 20 [IU] via SUBCUTANEOUS
  Filled 2018-12-04 (×5): qty 0.2

## 2018-12-04 MED ORDER — INSULIN GLARGINE 100 UNIT/ML ~~LOC~~ SOLN
25.0000 [IU] | Freq: Two times a day (BID) | SUBCUTANEOUS | Status: DC
  Administered 2018-12-04: 10:00:00 25 [IU] via SUBCUTANEOUS
  Filled 2018-12-04 (×2): qty 0.25

## 2018-12-04 MED ORDER — SODIUM CHLORIDE 0.9% FLUSH: 10.0000 mL | INTRAVENOUS | Status: DC | PRN

## 2018-12-04 MED ORDER — MIDAZOLAM HCL (PF) 5 MG/ML IJ SOLN
INTRAMUSCULAR | Status: AC
  Filled 2018-12-04: qty 2

## 2018-12-04 MED ORDER — HEPARIN SODIUM (PORCINE) 5000 UNIT/ML IJ SOLN
5000.0000 [IU] | Freq: Three times a day (TID) | INTRAMUSCULAR | Status: DC
  Administered 2018-12-04 – 2018-12-25 (×62): 5000 [IU] via SUBCUTANEOUS
  Filled 2018-12-04 (×63): qty 1

## 2018-12-04 MED ORDER — FENTANYL CITRATE (PF) 100 MCG/2ML IJ SOLN
INTRAMUSCULAR | Status: DC | PRN
  Administered 2018-12-04 (×2): 25 ug via INTRAVENOUS

## 2018-12-04 MED ORDER — FENTANYL CITRATE (PF) 100 MCG/2ML IJ SOLN
INTRAMUSCULAR | Status: AC
  Filled 2018-12-04: qty 2

## 2018-12-04 MED ORDER — BUTAMBEN-TETRACAINE-BENZOCAINE 2-2-14 % EX AERO
INHALATION_SPRAY | CUTANEOUS | Status: DC | PRN
  Administered 2018-12-04: 1 via TOPICAL

## 2018-12-04 MED ORDER — MIDAZOLAM HCL (PF) 10 MG/2ML IJ SOLN
INTRAMUSCULAR | Status: DC | PRN
  Administered 2018-12-04 (×2): 2 mg via INTRAVENOUS

## 2018-12-04 MED ORDER — COLCHICINE 0.6 MG PO TABS
0.6000 mg | ORAL_TABLET | Freq: Two times a day (BID) | ORAL | Status: DC
  Administered 2018-12-04 – 2018-12-09 (×9): 0.6 mg via ORAL
  Filled 2018-12-04 (×9): qty 1

## 2018-12-04 NOTE — Progress Notes (Signed)
  Echocardiogram Echocardiogram Transesophageal has been performed.  Janalyn Harder 12/04/2018, 11:16 AM

## 2018-12-04 NOTE — CV Procedure (Addendum)
     Transesophageal Echocardiogram Note  Yolanda [name] [id number] 01/24/1976  Procedure: Transesophageal Echocardiogram Indications: MRSA bacteremia, Subacute osteomyelitis  Procedure Details Consent: Obtained Time Out: Verified patient identification, verified procedure, site/side was marked, verified correct patient position, special equipment/implants available, Radiology Safety Procedures followed,  medications/allergies/relevent history reviewed, required imaging and test results available.  Performed  Medications: During this procedure the patient is administered a total of Versed 4 mg and Fentanyl 50 mg to achieve and maintain moderate conscious sedation.  The patient's heart rate, blood pressure, and oxygen saturation are monitored continuously during the procedure. The period of conscious sedation is 30 minutes, of which I was present face-to-face 100% of this time.  No evidence for an endocarditis.  Complications: No apparent complications Patient did tolerate procedure well.  Tobias Alexander, MD, [facility] 12/04/2018, 10:05 AM

## 2018-12-04 NOTE — TOC Initial Note (Signed)
Transition of Care [facility]) - Initial/Assessment Note    Patient Details  Name: Yolanda [name] MRN: [id number] Date of Birth: 21-Oct-1975  [facility] CM/SW Contact:    Mearl Latin, LCSW Phone Number: 12/04/2018, 4:54 PM  Clinical Narrative:                 CSW received consult for possible SNF placement at time of discharge since insurance is not in network with CIR. CSW spoke with patient regarding PT recommendation of SNF placement at time of discharge. Patient stated she would like to return home at discharge. CSW discussed her supports at home. Patient requested that she add her father to the conversation. On speaker phone,  patient's father reported that he and his wife are currently unable to care for patient at their home given patient's current physical needs and fall risk. Patient expressed understanding of PT recommendation and is agreeable to SNF placement at time of discharge if insurance will pay for it. Patient reports preference for SNF close to home in [place] or [place]. CSW discussed insurance authorization process and provided Medicare SNF ratings list. Patient expressed being hopeful for rehab and to feel better soon. No further questions reported at this time. CSW to continue to follow and assist with discharge planning needs.   Expected Discharge Plan: Skilled Nursing Facility Barriers to Discharge: Continued Medical Work up   Patient Goals and CMS Choice Patient states their goals for this hospitalization and ongoing recovery are:: Return home with parents CMS Medicare.gov Compare Post Acute Care list provided to:: Patient Choice offered to / list presented to : Patient  Expected Discharge Plan and Services Expected Discharge Plan: Skilled Nursing Facility In-house Referral: Clinical Social Work Discharge Planning Services: NA Post Acute Care Choice: Skilled Nursing Facility Living arrangements for the past 2 months: Single Family Home                  DME Arranged: N/A DME Agency: NA       HH Arranged: NA HH Agency: NA        Prior Living Arrangements/Services Living arrangements for the past 2 months: Single Family Home Lives with:: Parents Patient language and need for interpreter reviewed:: Yes Do you feel safe going back to the place where you live?: Yes      Need for Family Participation in Patient Care: Yes (Comment) Care giver support system in place?: Yes (comment)   Criminal Activity/Legal Involvement Pertinent to Current Situation/Hospitalization: No - Comment as needed  Activities of Daily Living Home Assistive Devices/Equipment: Blood pressure cuff, Cane (specify quad or straight), CBG Meter, Eyeglasses, Grab bars around toilet, Grab bars in shower, Walker (specify type)(Straight cane, Four-wheeled walker.) ADL Screening (condition at time of admission) Patient's cognitive ability adequate to safely complete daily activities?: Yes Is the patient deaf or have difficulty hearing?: No Does the patient have difficulty seeing, even when wearing glasses/contacts?: No Does the patient have difficulty concentrating, remembering, or making decisions?: No Patient able to express need for assistance with ADLs?: Yes Does the patient have difficulty dressing or bathing?: Yes Independently performs ADLs?: No Communication: Independent Dressing (OT): Needs assistance Is this a change from baseline?: Change from baseline, expected to last >3 days Grooming: Independent Feeding: Independent Bathing: Needs assistance Is this a change from baseline?: Change from baseline, expected to last >3 days Toileting: Needs assistance Is this a change from baseline?: Change from baseline, expected to last <3 days In/Out Bed: Needs assistance Is this  a change from baseline?: Change from baseline, expected to last >3 days Walks in Home: Needs assistance Is this a change from baseline?: Change from baseline, expected to last >3 days Does  the patient have difficulty walking or climbing stairs?: Yes Weakness of Legs: Right Weakness of Arms/Hands: None  Permission Sought/Granted Permission sought to share information with : Facility Medical sales representativeContact Representative, Family Supports Permission granted to share information with : Yes, Verbal Permission Granted  Share Information with NAME: Molly MaduroRobert  Permission granted to share info w AGENCY: SNFs  Permission granted to share info w Relationship: Father  Permission granted to share info w Contact Information: [phone]  Emotional Assessment Appearance:: Appears older than stated age Attitude/Demeanor/Rapport: Engaged, Self-Confident Affect (typically observed): Accepting, Appropriate Orientation: : Oriented to Self, Oriented to Place, Oriented to  Time, Oriented to Situation Alcohol / Substance Use: Not Applicable Psych Involvement: No (comment)  Admission diagnosis:  Abscess of foot [L02.619] Cellulitis [L03.90] Patient Active Problem List   Diagnosis Date Noted  . Subacute osteomyelitis, right ankle and foot (HCC)   . Cutaneous abscess of right foot   . Right foot infection 11/28/2018  . S/P PICC central line placement   . Abnormal LFTs   . Cigarette smoker 10/08/2018  . Diabetes mellitus type 2, uncontrolled, with complications (HCC) 10/07/2018  . MRSA bacteremia 10/07/2018  . 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 Severe protein-calorie malnutrition (HCC)   . Leg wound, right, sequela   . Diabetic polyneuropathy associated with type 2 diabetes mellitus (HCC)    PCP:  System, Pcp Not In Pharmacy:   [id number] - [place], [place] - [address] [facility] [address] [place] [id number] Phone: [phone] Fax: [phone]     Social Determinants of Health (SDOH) Interventions    Readmission Risk Interventions No flowsheet data found.

## 2018-12-04 NOTE — Progress Notes (Signed)
Occupational Therapy Treatment Patient Details Name: Yolanda [name] MRN: [id number] DOB: May 26, 1976 Today's Date: 12/04/2018    History of present illness Pt is a 43 y.o. F with significant PMH of diabetes type 2, diabetic foot ulcer, who presents with sepsis with right foot infection, osteomyelitis and MRSA bacteremia and presumed endocarditis. 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   OT comments  Patient agreeable for OT/PT session.  Requires mod-max +2 assist for transfers to/from [facility], total assist for toileting in standing. Educated on exercises to L UE and positioning for edema control and increased functional use/stregnth due to edema in hand. Patient educated on benefits of short term rehab vs HH services.  CIR denial, believe she will best benefit from SNF. Dc plan updated.    Follow Up Recommendations  SNF;Supervision/Assistance - 24 hour(out of network insurance for CIR, needs SNF)    Equipment Recommendations  3 in 1 bedside commode(drop arm commode)    Recommendations for Other Services      Precautions / Restrictions Precautions Precautions: Fall Precaution Comments: R BKA, wound vac Required Braces or Orthoses: Other Brace Other Brace: R residual limb guard Restrictions Weight Bearing Restrictions: Yes RLE Weight Bearing: Non weight bearing       Mobility Bed Mobility Overal bed mobility: Needs Assistance Bed Mobility: Supine to Sit     Supine to sit: Min guard     General bed mobility comments: Min assistance for trunk elevation.  Mildly unsteady until able to plant L foot on the floor.    Transfers Overall transfer level: Needs assistance Equipment used: None;Rolling walker (2 wheeled)(face to face) Transfers: Sit to/from Stand;Squat Pivot Transfers;Stand Pivot Transfers Sit to Stand: Mod assist;+2 safety/equipment(RW for sit to stand with increased ease ) Stand pivot transfers: Mod assist;+2 physical assistance(assist required to pivot hips to bed, pt  prematurely sitting) Squat pivot transfers: Mod assist;+2 physical assistance;+2 safety/equipment;Max assist(initally max +2 but mod assist with foward lean maintained)     General transfer comment: transfers as [facility]    Balance Overall balance assessment: Needs assistance Sitting-balance support: Bilateral upper extremity supported Sitting balance-[name]     Standing balance support: Bilateral upper extremity supported;During functional activity Standing balance-Leahy Scale: Poor Standing balance comment: relaint on B UE and external support                           ADL either performed or assessed with clinical judgement   ADL Overall ADL's : Needs assistance/impaired                         Toilet Transfer: Maximal assistance;Moderate assistance;+2 for physical assistance;+2 for safety/equipment;Stand-pivot;Squat-pivot;BSC Toilet Transfer Details (indicate cue type and reason): initial max assist, at times mod assist to completely pivot to BSC/EOB with +2 assist  Toileting- Clothing Manipulation and Hygiene: Total assistance;+2 for physical assistance;+2 for safety/equipment;Sit to/from stand       Functional mobility during ADLs: Moderate assistance;Rolling walker;Cueing for safety;Cueing for sequencing General ADL Comments: pt limited by decreased strength, balance, coordination and cognition     Vision   Vision Assessment?: No apparent visual deficits   Perception     Praxis      Cognition Arousal/Alertness: Awake/alert Behavior During Therapy: WFL for tasks assessed/performed Overall Cognitive Status: Impaired/Different from baseline Area of Impairment: Problem solving;Safety/judgement;Awareness  Safety/Judgement: Decreased awareness of safety;Decreased awareness of deficits Awareness: Emergent Problem Solving: Requires verbal cues;Difficulty sequencing General Comments: pt with  decreased awareness of deficits and need for assistance, cueing for problem sovling and safety         Exercises Exercises: Other exercises Other Exercises Other Exercises: educated on exercises to R UE completed: 5 reps 1 set of hand pumps, wrist flexion/extension and elbow flexion/extension   Shoulder Instructions       General Comments      Pertinent Vitals/ Pain       Pain Assessment: No/denies pain  Home Living                                          Prior Functioning/Environment              Frequency  Min 2X/week        Progress Toward Goals  OT Goals(current goals can now be found in the care plan section)  Progress towards OT goals: Progressing toward goals  Acute Rehab OT Goals Patient Stated Goal: "go home to my parents" OT Goal Formulation: With patient ADL Goals Pt Will Perform Lower Body Bathing: with min assist;sitting/lateral leans;with adaptive equipment Pt Will Perform Lower Body Dressing: with min assist;sitting/lateral leans;sit to/from stand;with adaptive equipment Pt Will Transfer to Toilet: with min assist;bedside commode;squat pivot transfer Pt Will Perform Toileting - Clothing Manipulation and hygiene: with min assist;sitting/lateral leans;sit to/from stand;with adaptive equipment Pt/caregiver will Perform Home Exercise Program: Increased strength;Increased ROM;Both right and left upper extremity;With written HEP provided  Plan Discharge plan remains appropriate;Frequency remains appropriate    Co-evaluation    PT/OT/SLP Co-Evaluation/Treatment: Yes Reason for Co-Treatment: Complexity of the patient's impairments (multi-system involvement);For patient/therapist safety;To address functional/ADL transfers PT goals addressed during session: Mobility/safety with mobility OT goals addressed during session: ADL's and self-care      AM-PAC OT "6 Clicks" Daily Activity     Outcome Measure   Help from another person  eating meals?: None Help from another person taking care of personal grooming?: None Help from another person toileting, which includes using toliet, bedpan, or urinal?: A Lot Help from another person bathing (including washing, rinsing, drying)?: A Lot Help from another person to put on and taking off regular upper body clothing?: A Little Help from another person to put on and taking off regular lower body clothing?: A Lot 6 Click Score: 17    End of Session Equipment Utilized During Treatment: Gait belt;Rolling walker;Other (comment)(residual limb protector)  OT Visit Diagnosis: Unsteadiness on feet (R26.81);Muscle weakness (generalized) (M62.81)   Activity Tolerance Patient tolerated treatment well   Patient Left in bed;with call bell/phone within reach;with bed alarm set   Nurse Communication Mobility status        Time: 0100-7121 OT Time Calculation (min): 27 min  Charges: OT General Charges $OT Visit: 1 Visit OT Treatments $Self Care/Home Management : 8-22 mins  Chancy Milroy, OT Acute Rehabilitation Services Pager [phone] Office [phone]    Chancy Milroy 12/04/2018, 4:40 PM

## 2018-12-04 NOTE — Progress Notes (Signed)
Physical Therapy Treatment Patient Details Name: Yolanda [name] MRN: [id number] DOB: 08-Jul-1976 Today's Date: 12/04/2018    History of Present Illness Pt is a 43 y.o. F with significant PMH of diabetes type 2, diabetic foot ulcer, who presents with sepsis with right foot infection, osteomyelitis and MRSA bacteremia and presumed endocarditis. 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    PT Comments    Pt is progressing slowly but continues to require +2 assistance to progress transfer training.  She is very concerned about her ability to pay for rehab.  Messaged SW and informed of situation.  Pt continues to be an excellent candidate for post acute rehab, will update recommendations to SNF based on CUR denial.  Will inform supervising PT of change in recommendations.  Plan next session for continued transfer training.    Follow Up Recommendations  SNF;Supervision/Assistance - 24 hour(if patient refuses placement will require max HH services including HHPT, OT, aide and RN.  )     Equipment Recommendations  Wheelchair (measurements PT);Wheelchair cushion (measurements PT);3in1 (PT)(drop arm BSC)    Recommendations for Other Services       Precautions / Restrictions Precautions Precautions: Fall Precaution Comments: R BKA, wound vac Required Braces or Orthoses: Other Brace Other Brace: R residual limb guard Restrictions RLE Weight Bearing: Non weight bearing    Mobility  Bed Mobility   Bed Mobility: Supine to Sit     Supine to sit: Min guard     General bed mobility comments: Min assistance for trunk elevation.  Mildly unsteady until able to plant L foot on the floor.    Transfers Overall transfer level: Needs assistance Equipment used: None(FACE to [facility]) Transfers: Sit to/from [employer] Transfers Sit to Stand: Mod assist;+2 safety/equipment(utilized RW for sit to stand with improved ease ) Stand pivot transfers: Mod assist;+2 physical  assistance(PT unable to pivot completely before sitting prematurely and OT facililtating hips toward bed.  ) Squat pivot transfers: Mod assist;+2 physical assistance(initially max +2 to achieve partially clearance of bottom from bed.  Required cues for forward weight shifting)        Ambulation/Gait                 Stairs             Wheelchair Mobility    Modified Rankin (Stroke Patients Only)       Balance Overall balance assessment: Needs assistance Sitting-balance support: Bilateral upper extremity supported Sitting balance-[name]       Standing balance-Leahy Scale: Poor                              Cognition Arousal/Alertness: Awake/alert Behavior During Therapy: WFL for tasks assessed/performed Overall Cognitive Status: Impaired/Different from baseline Area of Impairment: Problem solving;Safety/judgement                         Safety/Judgement: Decreased awareness of safety;Decreased awareness of deficits   Problem Solving: Requires verbal cues General Comments: Pt with unrealistic expectations of returning home.       Exercises      General Comments        Pertinent Vitals/Pain Pain Assessment: No/denies pain    Home Living                      Prior Function  PT Goals (current goals can now be found in the care plan section) Acute Rehab PT Goals Patient Stated Goal: "go home to my parents" Potential to Achieve Goals: Good Progress towards PT goals: Progressing toward goals    Frequency    Min 3X/week      PT Plan Discharge plan needs to be updated    Co-evaluation PT/OT/SLP Co-Evaluation/Treatment: Yes Reason for Co-Treatment: Complexity of the patient's impairments (multi-system involvement);For patient/therapist safety PT goals addressed during session: Mobility/safety with mobility OT goals addressed during session: ADL's and self-care      AM-PAC PT "6  Clicks" Mobility   Outcome Measure  Help needed turning from your back to your side while in a flat bed without using bedrails?: A Little Help needed moving from lying on your back to sitting on the side of a flat bed without using bedrails?: A Little Help needed moving to and from a bed to a chair (including a wheelchair)?: A Lot Help needed standing up from a chair using your arms (e.g., wheelchair or bedside chair)?: A Lot Help needed to walk in hospital room?: Total Help needed climbing 3-5 steps with a railing? : Total 6 Click Score: 12    End of Session   Activity Tolerance: Patient tolerated treatment well Patient left: in bed;with call bell/phone within reach;with bed alarm set Nurse Communication: Mobility status PT Visit Diagnosis: Other abnormalities of gait and mobility (R26.89) Pain - Right/Left: Left Pain - part of body: Leg     Time: [id number] PT Time Calculation (min) (ACUTE ONLY): 22 min  Charges:  $Therapeutic Activity: 8-22 mins                     [name], PTA Acute Rehabilitation Services Pager [phone] Office [phone]     [name] 12/04/2018, 3:39 PM

## 2018-12-04 NOTE — Progress Notes (Signed)
Inpatient Rehabilitation-Admissions Coordinator   After benefits check, pt's insurance is not in network with [facility]. Pt now open to other venues of care but would like more information regarding that option. AC has contacted SW regarding recommendation for SNF due to cost of CIR when out of network.   AC will sign off.   Please call if questions.   Nanine Means, OTR/L  Rehab Admissions Coordinator  [phone] 12/04/2018 12:42 PM

## 2018-12-04 NOTE — Progress Notes (Signed)
Patient ID: Yolanda [name], female   DOB: 07-08-1976, 43 y.o.   MRN: [id number] Patient is status post right transtibial amputation.  Patient states that she has had chronic swelling in both wrists.  She states that the right wrist the swelling has gone down and she states that the swelling in the left wrist is persistent.  Patient denies a history of gout.  Examination she does have significant swelling around the left wrist there is some bruising in the palm.  There is no red streaking up the arm.  There is tenderness to palpation but no pain with passive range of motion of the wrist.  Uric acid has been ordered.  With such a longstanding history of swelling this may be gout.  If patient does not respond to colchicine 0.6 mg twice daily recommend an MRI scan and patient most likely would then need a orthopedic hand consult to evaluate for possible debridement.

## 2018-12-04 NOTE — Progress Notes (Signed)
Peripherally Inserted Central Catheter/Midline Placement  The IV Nurse has discussed with the patient and/or persons authorized to consent for the patient, the purpose of this procedure and the potential benefits and risks involved with this procedure.  The benefits include less needle sticks, lab draws from the catheter, and the patient may be discharged home with the catheter. Risks include, but not limited to, infection, bleeding, blood clot (thrombus formation), and puncture of an artery; nerve damage and irregular heartbeat and possibility to perform a PICC exchange if needed/ordered by physician.  Alternatives to this procedure were also discussed.  Bard Power PICC patient education guide, fact sheet on infection prevention and patient information card has been provided to patient /or left at bedside.    PICC/Midline Placement Documentation  PICC Single Lumen 12/04/18 PICC Right Brachial 36 cm 0 cm (Active)  Indication for Insertion or Continuance of Line Home intravenous therapies (PICC only) 12/04/2018  2:30 PM  Exposed Catheter (cm) 0 cm 12/04/2018  2:30 PM  Dressing Change Due 12/11/18 12/04/2018  2:30 PM       [name] M 12/04/2018, 2:46 PM

## 2018-12-04 NOTE — Progress Notes (Signed)
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  On 5/20, patient presented at [facility] with complaint of worsening pain and swelling of her right foot for 1 week.  CT scanning was obtained which was concerning for destruction of the entire mid and hindfoot with fluid and gas collections, surrounding cellulitis. She was transferred to [facility] as a direct admit. On presentation, patient was septic with a heart rate 80s, blood pressure in 80s and lactate elevated to 5.7.  Infectious disease and orthopedic consult were obtained.  Subjective: Patient was seen and examined this afternoon.. Lying in bed.  Alert, awake, oriented x3.  Garbled speech.  Grossly swollen left wrist.  States it was swollen when she came in and progressively got worse.    Assessment/Plan:  Principal Problem:   MRSA bacteremia Active Problems:   Diabetes mellitus type 2, uncontrolled, with complications (HCC)   Charcot foot due to diabetes mellitus (HCC)   Severe protein-calorie malnutrition (HCC)   Diabetic polyneuropathy associated with type 2 diabetes mellitus (HCC)   Cigarette smoker   Subacute osteomyelitis, right ankle and foot (HCC)   Cutaneous abscess of right foot  Sepsis with right foot infection, osteomyelitis - Initially started on broad-spectrum antibiotics.  Currently on IV vancomycin. - Blood cultures negative so far.  - Orthopedics consulted, patient underwent transtibial amputation on 5/22,  - PICC line placed today.  Recent MRSA osteomyelitis, bacteremia, presumed endocarditis -Completed course of IV vancomycin on 5/12. -TEE was  repeated today.  No evidence of endocarditis.   -ID following. Patient is currently on IV vancomycin.  Left wrist inflammation -Suspect septic arthritis -Progressively worsening swelling, redness, warmth and decreased grip strength in left wrist.  -Discussed with orthopedics Dr. Lajoyce Corners.  Recommended to obtain an x-ray.  Also suggested empiric colchicine 0.6 mg twice daily for suspicion of gout.  I will add on uric acid level to morning labs today -Continue IV vancomycin.  Diabetes mellitus type 2, uncontrolled, with complications, foot infection  -Noted low blood sugar reading of 51 today.  Patient was n.p.o. for procedure. -Currently on Lantus 30 units twice daily.  Will reduce it to to 20 units twice daily. -Continue sliding scale insulin with Accu-Cheks.  Essential hypertension BP stable  Right lower extremity DVT -Diagnosed in 07/2018, used to be on Xarelto, she completed the starter pack however Xarelto was discontinued due to bleeding issues at [facility] in 09/2018.  Right lower extremity Dopplers on 3/29 did not show any DVT hence Xarelto was not resumed.  Hypercalcemia -Likely due to sepsis, dehydration, calcium 10.5 with a time of admission -Resolved  Severe protein calorie malnutrition -Albumin 1.2 -Placed on pro-stat, nutritional supplements  Acute on chronic anemia  -H&H currently stable baseline 9-10 -Hemoglobin 7.4 at OSH, transfused 1 unit packed RBC,  -H&H 7.7 this morning.  Urinary retention - UA negative for UTI  - Flomax for 7 days  Body mass index is 27.61 kg/m. Mobility: PT eval obtained. SNF per PT recommendation. Diet: Diabetic diet DVT prophylaxis:  Do not see any prophylaxis ordered.  I will order heparin subcu TID. Code Status:   Code Status: Full Code  Family Communication:  Expected Discharge:  Rehab  Consultants:  ID, orthopedics.  Procedures:  5/22 -right foot transtibial amputation  Antimicrobials:  Anti-infectives  (From admission, onward)   Start     Dose/Rate Route Frequency Ordered Stop   12/01/18 1330  vancomycin (VANCOCIN) 1,250 mg in sodium chloride 0.9 % 250 mL IVPB     1,250 mg 166.7 mL/hr over 90 Minutes Intravenous Every 12 hours 12/01/18 1310     11/30/18 1500  ceFEPIme (MAXIPIME) 2 g in sodium chloride 0.9 % 100 mL IVPB  Status:  Discontinued     2 g 200 mL/hr over 30 Minutes Intravenous Every 8 hours 11/30/18 1416 12/03/18 1254   11/30/18 1500  metroNIDAZOLE (FLAGYL) tablet 500 mg  Status:  Discontinued     500 mg Oral Every 8 hours 11/30/18 1416 12/03/18 1254   11/30/18 1415  metroNIDAZOLE (FLAGYL) IVPB 500 mg  Status:  Discontinued     500 mg 100 mL/hr over 60 Minutes Intravenous Every 8 hours 11/30/18 1411 11/30/18 1416   11/28/18 2045  vancomycin (VANCOCIN) 1,750 mg in sodium chloride 0.9 % 500 mL IVPB  Status:  Discontinued     1,750 mg 250 mL/hr over 120 Minutes Intravenous Every 24 hours 11/28/18 2032 12/01/18 1310   11/28/18 2030  vancomycin (VANCOCIN) 1,250 mg in sodium chloride 0.9 % 250 mL IVPB  Status:  Discontinued     1,250 mg 166.7 mL/hr over 90 Minutes Intravenous Every 24 hours 11/28/18 2004 11/28/18 2032   11/28/18 1900  ceFEPIme (MAXIPIME) 2 g in sodium chloride 0.9 % 100 mL IVPB  Status:  Discontinued     2 g 200 mL/hr over 30 Minutes Intravenous Every 8 hours 11/28/18 1848 11/30/18 1304   11/28/18 1815  metroNIDAZOLE (FLAGYL) tablet 500 mg  Status:  Discontinued     500 mg Oral Every 8 hours 11/28/18 1803 11/30/18 1304      Infusions:  . sodium chloride 250 mL (11/30/18 0650)  . sodium chloride Stopped (11/30/18 1636)  . lactated ringers Stopped (11/30/18 1330)  . methocarbamol (ROBAXIN) IV    . vancomycin 1,250 mg (12/04/18 1008)    Scheduled Meds: . docusate sodium  100 mg Oral BID  . insulin aspart  0-15 Units Subcutaneous TID WC  . insulin aspart  0-5 Units Subcutaneous QHS  . insulin aspart  6 Units Subcutaneous TID WC  . insulin glargine  25 Units  Subcutaneous BID  . multivitamin with minerals  1 tablet Oral Daily  . Ensure Max Protein  11 oz Oral Daily  . tamsulosin  0.4 mg Oral QPC breakfast    PRN meds: sodium chloride, acetaminophen **OR** acetaminophen, bisacodyl, HYDROmorphone (DILAUDID) injection, magnesium citrate, methocarbamol **OR** methocarbamol (ROBAXIN) IV, metoCLOPramide **OR** metoCLOPramide (REGLAN) injection, ondansetron **OR** ondansetron (ZOFRAN) IV, oxyCODONE, oxyCODONE, polyethylene glycol, sodium chloride flush   Objective: Vitals:   12/04/18 1130 12/04/18 1136  BP:  123/82  Pulse: 95 92  Resp: 20 19  Temp:    SpO2: 96% 97%    Intake/Output Summary (Last 24 hours) at 12/04/2018 1600 Last data filed at 12/04/2018 0450 Gross per 24 hour  Intake -  Output 1800 ml  Net -1800 ml   Filed Weights   12/02/18 0600 12/03/18 0641 12/04/18 0657  Weight: 80.9 kg 80 kg 77.6 kg   Weight change: -2.4 kg Body mass index is 27.61 kg/m.   Physical Exam: General exam: Appears calm and comfortable.  Skin: No rashes, lesions or ulcers. HEENT: Atraumatic, normocephalic, supple neck, no obvious bleeding Lungs:  Clear to auscultation bilaterally CVS: Regular rate and rhythm, no murmur GI/Abd soft, nontender, nondistended, bowel sound present CNS: Alert, awake, able to follow command  psychiatry: Appropriate mood Extremities: No pedal edema, left wrist with swelling, redness, tenderness and decreased range of movement.  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  Recent Labs  Lab 11/29/18 0328 11/30/18 0319 12/01/18 0301 12/02/18 0258 12/04/18 0259  WBC 20.2* 21.8* 25.3* 21.0* 22.0*  HGB 8.9* 8.2* 7.6* 7.6* 7.7*  HCT 26.4* 24.2* 22.8* 22.9* 23.7*  MCV 90.4 90.3 90.8 91.6 93.3  PLT 363 356 334 346 366   Recent Labs  Lab 11/29/18 0328 11/30/18 0319 12/01/18 0301 12/02/18 0258 12/04/18 0259  NA 135 137  133* 137 139  K 4.0 3.4* 3.8 3.5 3.4*  CL 106 107 107 108 110  CO2 23 23 21* 24 24  GLUCOSE 247* 93 341* 119* 78  BUN 17 12 9 12 9   CREATININE 0.97 0.91 0.76 0.65 0.51  CALCIUM 9.7  9.6 9.2 8.3* 7.9* 7.3*    Lorin GlassBinaya Trinton Prewitt, MD  [facility] 12/04/2018

## 2018-12-04 NOTE — Interval H&P Note (Signed)
History and Physical Interval Note:  12/04/2018 10:05 AM  Yolanda [name]  has presented today for surgery, with the diagnosis of BACTEREMIA.  The various methods of treatment have been discussed with the patient and family. After consideration of risks, benefits and other options for treatment, the patient has consented to  Procedure(s): TRANSESOPHAGEAL ECHOCARDIOGRAM (TEE) (N/A) as a surgical intervention.  The patient's history has been reviewed, patient examined, no change in status, stable for surgery.  I have reviewed the patient's chart and labs.  Questions were answered to the patient's satisfaction.     Yolanda [name]

## 2018-12-04 NOTE — Plan of Care (Signed)
  Problem: Education: Goal: Knowledge of General Education information will improve Description: Including pain rating scale, medication(s)/side effects and non-pharmacologic comfort measures Outcome: Progressing   Problem: Nutrition: Goal: Adequate nutrition will be maintained Outcome: Progressing   Problem: Coping: Goal: Level of anxiety will decrease Outcome: Progressing   Problem: Elimination: Goal: Will not experience complications related to urinary retention Outcome: Progressing   

## 2018-12-04 NOTE — NC FL2 (Signed)
[place] MEDICAID FL2 LEVEL OF CARE SCREENING TOOL     IDENTIFICATION  Patient Name: Yolanda [name] Birthdate: 12/01/1975 Sex: female Admission Date (Current Location): 11/28/2018  [place] and [place]:  [place]   Facility and Address:  [facility], [address] [place], [place] [address]      Provider Number: [id number]  Attending Physician Name and Address:  Lorin Glass, MD  Relative Name and Phone Number:  Seward Speck [phone]    Current Level of Care: Hospital Recommended Level of Care: Skilled Nursing Facility Prior Approval Number:    Date Approved/Denied:   PASRR Number:    Discharge Plan: SNF    Current Diagnoses: Patient Active Problem List   Diagnosis Date Noted  . Subacute osteomyelitis, right ankle and foot (HCC)   . Cutaneous abscess of right foot   . Right foot infection 11/28/2018  . S/P PICC central line placement   . Abnormal LFTs   . Cigarette smoker 10/08/2018  . Diabetes mellitus type 2, uncontrolled, with complications (HCC) 10/07/2018  . MRSA bacteremia 10/07/2018  . 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 Severe protein-calorie malnutrition (HCC)   . Leg wound, right, sequela   . Diabetic polyneuropathy associated with type 2 diabetes mellitus (HCC)     Orientation RESPIRATION BLADDER Height & Weight     Self, Time, Situation, Place  Normal Continent Weight: 171 lb 1.2 oz (77.6 kg) Height:  5\' 6"  (167.6 cm)  BEHAVIORAL SYMPTOMS/MOOD NEUROLOGICAL BOWEL NUTRITION STATUS      Continent Diet(Please see DC Summary)  AMBULATORY STATUS COMMUNICATION OF NEEDS Skin   Limited Assist Verbally Surgical wounds, Wound Vac(Closed incision on leg; Pravena wound vac for 1 week)                       Personal Care Assistance Level of Assistance  Bathing, Feeding, Dressing Bathing Assistance: Limited assistance Feeding assistance: Independent Dressing Assistance: Limited assistance      Functional Limitations Info  Sight, Hearing, Speech Sight Info: Adequate Hearing Info: Adequate Speech Info: Adequate    SPECIAL CARE FACTORS FREQUENCY  PT (By licensed PT), OT (By licensed OT)     PT Frequency: 5x/week OT Frequency: 3x/week            Contractures Contractures Info: Not present    Additional Factors Info  Code Status, Allergies, Insulin Sliding Scale Code Status Info: Full Allergies Info: NKA   Insulin Sliding Scale Info: See DC Summary for dose       Current Medications (12/04/2018):  This is the current hospital active medication list Current Facility-Administered Medications  Medication Dose Route Frequency Provider Last Rate Last Dose  . 0.9 %  sodium chloride infusion   Intravenous PRN Lars Masson, MD 10 mL/hr at 11/30/18 0650 250 mL at 11/30/18 0650  . 0.9 %  sodium chloride infusion   Intravenous Continuous Lars Masson, MD   Stopped at 11/30/18 1636  . acetaminophen (TYLENOL) tablet 650 mg  650 mg Oral Q6H PRN Lars Masson, MD   650 mg at 12/01/18 2224   Or  . acetaminophen (TYLENOL) suppository 650 mg  650 mg Rectal Q6H PRN Lars Masson, MD      . bisacodyl (DULCOLAX) suppository 10 mg  10 mg Rectal Daily PRN Lars Masson, MD      . docusate sodium (COLACE) capsule 100 mg  100 mg Oral BID Lars Masson,  MD   100 mg at 12/01/18 2225  . HYDROmorphone (DILAUDID) injection 0.5-1 mg  0.5-1 mg Intravenous Q4H PRN Lars MassonNelson, Katarina H, MD      . insulin aspart (novoLOG) injection 0-15 Units  0-15 Units Subcutaneous TID WC Lars MassonNelson, Katarina H, MD   3 Units at 12/03/18 1719  . insulin aspart (novoLOG) injection 0-5 Units  0-5 Units Subcutaneous QHS Lars MassonNelson, Katarina H, MD   5 Units at 11/30/18 2225  . insulin aspart (novoLOG) injection 6 Units  6 Units Subcutaneous TID WC Lars MassonNelson, Katarina H, MD   6 Units at 12/03/18 1719  . insulin glargine (LANTUS) injection 25 Units  25 Units Subcutaneous BID Lars MassonNelson, Katarina H, MD   25  Units at 12/04/18 1006  . lactated ringers infusion   Intravenous Continuous Lars MassonNelson, Katarina H, MD   Stopped at 11/30/18 1330  . magnesium citrate solution 1 Bottle  1 Bottle Oral Once PRN Lars MassonNelson, Katarina H, MD      . methocarbamol (ROBAXIN) tablet 500 mg  500 mg Oral Q6H PRN Lars MassonNelson, Katarina H, MD       Or  . methocarbamol (ROBAXIN) 500 mg in dextrose 5 % 50 mL IVPB  500 mg Intravenous Q6H PRN Lars MassonNelson, Katarina H, MD      . metoCLOPramide (REGLAN) tablet 5-10 mg  5-10 mg Oral Q8H PRN Lars MassonNelson, Katarina H, MD       Or  . metoCLOPramide (REGLAN) injection 5-10 mg  5-10 mg Intravenous Q8H PRN Lars MassonNelson, Katarina H, MD      . multivitamin with minerals tablet 1 tablet  1 tablet Oral Daily Lars MassonNelson, Katarina H, MD   1 tablet at 12/03/18 1009  . ondansetron (ZOFRAN) tablet 4 mg  4 mg Oral Q6H PRN Lars MassonNelson, Katarina H, MD       Or  . ondansetron [facility](ZOFRAN) injection 4 mg  4 mg Intravenous Q6H PRN Lars MassonNelson, Katarina H, MD      . oxyCODONE (Oxy IR/ROXICODONE) immediate release tablet 10-15 mg  10-15 mg Oral Q4H PRN Lars MassonNelson, Katarina H, MD      . oxyCODONE (Oxy IR/ROXICODONE) immediate release tablet 5-10 mg  5-10 mg Oral Q4H PRN Lars MassonNelson, Katarina H, MD      . polyethylene glycol (MIRALAX / GLYCOLAX) packet 17 g  17 g Oral Daily PRN Lars MassonNelson, Katarina H, MD      . protein supplement (ENSURE MAX) liquid  11 oz Oral Daily Lars MassonNelson, Katarina H, MD   11 oz at 12/04/18 1249  . tamsulosin (FLOMAX) capsule 0.4 mg  0.4 mg Oral QPC breakfast Lars MassonNelson, Katarina H, MD   0.4 mg at 12/04/18 1249  . vancomycin (VANCOCIN) 1,250 mg in sodium chloride 0.9 % 250 mL IVPB  1,250 mg Intravenous Q12H Lars MassonNelson, Katarina H, MD 166.7 mL/hr at 12/04/18 1008 1,250 mg at 12/04/18 1008     Discharge Medications: Please see discharge summary for a list of discharge medications.  Relevant Imaging Results:  Relevant Lab Results:   Additional Information SSN: [id number]  [address] [place], [place]

## 2018-12-04 NOTE — Progress Notes (Addendum)
[facility]  Date of Admission:  11/28/2018   Total days of antibiotics 7        Day 7 of vancomycin                ASSESSMENT: Dizziness this appears to be clinically improving.  He is remained afebrile over the past 24 hours.  She denies pain at the affected site.  She is to undergo TEE today.  Patient is cleared for PICC line placement today, order has been placed by Dr. Orvan Falconer on 5/25 as her blood cultures are negative x24 hours.  Patient will need 4 to 6 weeks of IV vancomycin pending results of TEE.  Left wrist appears edematous, erythematous, warm to touch and with decreased grip strength.  As compared to 5/22, this is significantly worse. Patient is able to feel her finger and there is intact digit manipulation but limited grip strength. I have discussed this with Dr. Pola Corn who will evaluate the patient's wrist/hand during rounds this morning. I do not feel this is compartment syndrome. Most likely this may represent septic arthritis.   PLAN: 1. Continue vancomycin 2. Complete PEG placement 3. Follow-up TEE  Principal Problem:   MRSA bacteremia Active Problems:   Subacute osteomyelitis, right ankle and foot (HCC)   Diabetes mellitus type 2, uncontrolled, with complications (HCC)   Charcot foot due to diabetes mellitus (HCC)   Cigarette smoker   Severe protein-calorie malnutrition (HCC)   Diabetic polyneuropathy associated with type 2 diabetes mellitus (HCC)   Cutaneous abscess of right foot   Scheduled Meds: . docusate sodium  100 mg Oral BID  . insulin aspart  0-15 Units Subcutaneous TID WC  . insulin aspart  0-5 Units Subcutaneous QHS  . insulin aspart  6 Units Subcutaneous TID WC  . insulin glargine  25 Units Subcutaneous BID  . multivitamin with minerals  1 tablet Oral Daily  . Ensure Max Protein  11 oz Oral Daily  . tamsulosin  0.4 mg Oral QPC breakfast   Continuous Infusions: . sodium chloride 250 mL (11/30/18 0650)  .  sodium chloride Stopped (11/30/18 1636)  . sodium chloride 20 mL/hr at 12/03/18 2213  . lactated ringers Stopped (11/30/18 1330)  . methocarbamol (ROBAXIN) IV    . vancomycin 1,250 mg (12/03/18 2210)   PRN Meds:.sodium chloride, acetaminophen **OR** acetaminophen, bisacodyl, HYDROmorphone (DILAUDID) injection, magnesium citrate, methocarbamol **OR** methocarbamol (ROBAXIN) IV, metoCLOPramide **OR** metoCLOPramide (REGLAN) injection, ondansetron **OR** ondansetron (ZOFRAN) IV, oxyCODONE, oxyCODONE, polyethylene glycol   SUBJECTIVE: Patient states that she feels well this morning but is hungry.  She inquired as to the need for the TEE.  I informed her that this is to assist [place] with determining whether or not there is involvement of her heart from her bacterial infection.  She stated that she is willing to complete the procedure and understood the need.  Review of Systems: Review of Systems  Constitutional: Negative for chills, diaphoresis and fever.  Respiratory: Negative for cough and shortness of breath.   Cardiovascular: Negative for chest pain and leg swelling.  Gastrointestinal: Negative for nausea and vomiting.  Musculoskeletal: Negative for myalgias.    No Known Allergies  OBJECTIVE: Vitals:   12/04/18 0115 12/04/18 0515 12/04/18 0638 12/04/18 0657  BP: 114/69 (!) 145/104 (!) 139/100   Pulse: (!) 105 (!) 106 (!) 102   Resp: (!) Temp: 97.8 F (36.6 C) 98 F (36.7 C) 97.8  F (36.6 C)   TempSrc: Oral Oral Oral   SpO2: 100% 100% 96%   Weight:    77.6 kg  Height:       Body mass index is 27.61 kg/m.  Physical Exam Constitutional:      General: She is not in acute distress.    Appearance: Normal appearance. She is not ill-appearing or diaphoretic.  Cardiovascular:     Rate and Rhythm: Normal rate and regular rhythm.  Pulmonary:     Effort: Pulmonary effort is normal.     Breath sounds: Normal breath sounds. No wheezing.  Abdominal:     General: Abdomen is  flat.     Tenderness: There is no abdominal tenderness.  Musculoskeletal:        General: Swelling (Left wrist), tenderness (Left wrist) and deformity (Left wrist) present.  Skin:    Findings: Bruising (Left wrist) and erythema (Left wrist) present.  Neurological:     Mental Status: She is alert.     Lab Results Lab Results  Component Value Date   WBC 22.0 (H) 12/04/2018   HGB 7.7 (L) 12/04/2018   HCT 23.7 (L) 12/04/2018   MCV 93.3 12/04/2018   PLT 366 12/04/2018    Lab Results  Component Value Date   CREATININE 0.51 12/04/2018   BUN 9 12/04/2018   NA 139 12/04/2018   K 3.4 (L) 12/04/2018   CL 110 12/04/2018   CO2 24 12/04/2018    Lab Results  Component Value Date   ALT 20 11/29/2018   AST 32 11/29/2018   ALKPHOS 289 (H) 11/29/2018   BILITOT 3.3 (H) 11/29/2018     Microbiology: Recent Results (from the past 240 hour(s))  MRSA PCR Screening     Status: Abnormal   Collection Time: 11/28/18  5:30 PM  Result Value Ref Range Status   MRSA by PCR POSITIVE (A) NEGATIVE Final    Comment:        The GeneXpert MRSA Assay (FDA approved for NASAL specimens only), is one component of a comprehensive MRSA colonization surveillance program. It is not intended to diagnose MRSA infection nor to guide or monitor treatment for MRSA infections. RESULT CALLED TO, READ BACK BY AND VERIFIED WITHSilverio Decamp RN 11/28/18 1928 JDW Performed at [facility], [address], [place], [place] [address]   SARS Coronavirus 2 [facility] order, Performed in [facility] hospital lab)     Status: None   Collection Time: 11/28/18  6:29 PM  Result Value Ref Range Status   SARS Coronavirus 2 NEGATIVE NEGATIVE Final    Comment: (NOTE) If result is NEGATIVE SARS-CoV-2 target nucleic acids are NOT DETECTED. The SARS-CoV-2 RNA is generally detectable in upper and lower  respiratory specimens during the acute phase of infection. The lowest  concentration of SARS-CoV-2 viral copies this assay  can detect is 250  copies / mL. A negative result does not preclude SARS-CoV-2 infection  and should not be used as the sole basis for treatment or other  patient management decisions.  A negative result may occur with  improper specimen collection / handling, submission of specimen other  than nasopharyngeal swab, presence of viral mutation(s) within the  areas targeted by this assay, and inadequate number of viral copies  (<250 copies / mL). A negative result must be combined with clinical  observations, patient history, and epidemiological information. If result is POSITIVE SARS-CoV-2 target nucleic acids are DETECTED. The SARS-CoV-2 RNA is generally detectable in upper and lower  respiratory specimens dur ing the acute phase of infection.  Positive  results are indicative of active infection with SARS-CoV-2.  Clinical  correlation with patient history and other diagnostic information is  necessary to determine patient infection status.  Positive results do  not rule out bacterial infection or co-infection with other viruses. If result is PRESUMPTIVE POSTIVE SARS-CoV-2 nucleic acids MAY BE PRESENT.   A presumptive positive result was obtained on the submitted specimen  and confirmed on repeat testing.  While 2019 novel coronavirus  (SARS-CoV-2) nucleic acids may be present in the submitted sample  additional confirmatory testing may be necessary for epidemiological  and / or clinical management purposes  to differentiate between  SARS-CoV-2 and other Sarbecovirus currently known to infect humans.  If clinically indicated additional testing with an alternate test  methodology [phone] is advised. The SARS-CoV-2 RNA is generally  detectable in upper and lower respiratory sp ecimens during the acute  phase of infection. The expected result is Negative. Fact Sheet for Patients:  BoilerBrush.com.cyhttps://www.fda.gov/media/136312/download Fact Sheet for Healthcare Providers:  https://pope.com/https://www.fda.gov/media/136313/download This test is not yet approved or cleared by the [place] FDA and has been authorized for detection and/or diagnosis of SARS-CoV-2 by FDA under an Emergency Use Authorization (EUA).  This EUA will remain in effect (meaning this test can be used) for the duration of the COVID-19 declaration under Section 564(b)(1) of the Act, 21 U.S.C. section 360bbb-3(b)(1), unless the authorization is terminated or revoked sooner. Performed at [facility], [address] [place], [place] [address]   Culture, blood (routine x 2)     Status: Abnormal   Collection Time: 11/28/18  6:29 PM  Result Value Ref Range Status   Specimen Description BLOOD RIGHT ARM  Final   Special Requests   Final    BOTTLES DRAWN AEROBIC AND ANAEROBIC Blood Culture adequate volume   Culture  Setup Time   Final    GRAM POSITIVE COCCI IN BOTH AEROBIC AND ANAEROBIC BOTTLES CRITICAL RESULT CALLED TO, READ BACK BY AND VERIFIED WITHLelon Huh: L CURRAN [facility] [id number] 11/29/18 A BROWNING Performed at [facility], [address] [place], [place] [address]    Culture METHICILLIN RESISTANT STAPHYLOCOCCUS AUREUS (A)  Final   Report Status 12/01/2018 FINAL  Final   Organism ID, Bacteria METHICILLIN RESISTANT STAPHYLOCOCCUS AUREUS  Final      Susceptibility   Methicillin resistant staphylococcus aureus - MIC*    CIPROFLOXACIN >=8 RESISTANT Resistant     ERYTHROMYCIN >=8 RESISTANT Resistant     GENTAMICIN <=0.5 SENSITIVE Sensitive     OXACILLIN >=4 RESISTANT Resistant     TETRACYCLINE <=1 SENSITIVE Sensitive     VANCOMYCIN <=0.5 SENSITIVE Sensitive     TRIMETH/SULFA <=10 SENSITIVE Sensitive     CLINDAMYCIN >=8 RESISTANT Resistant     RIFAMPIN <=0.5 SENSITIVE Sensitive     Inducible Clindamycin NEGATIVE Sensitive     * METHICILLIN RESISTANT STAPHYLOCOCCUS AUREUS  Blood Culture ID Panel (Reflexed)     Status: Abnormal   Collection Time: 11/28/18  6:29 PM  Result Value Ref Range Status    Enterococcus species NOT DETECTED NOT DETECTED Final   Listeria monocytogenes NOT DETECTED NOT DETECTED Final   Staphylococcus species DETECTED (A) NOT DETECTED Final    Comment: CRITICAL RESULT CALLED TO, READ BACK BY AND VERIFIED WITH: Lelon HuhL CURRAN PHARMD 1612 11/29/18 A BROWNING    Staphylococcus aureus (BCID) DETECTED (A) NOT DETECTED Final    Comment: Methicillin (oxacillin)-resistant Staphylococcus aureus (MRSA). MRSA is  predictably resistant to beta-lactam antibiotics (except ceftaroline). Preferred therapy is vancomycin unless clinically contraindicated. Patient requires contact precautions if  hospitalized. CRITICAL RESULT CALLED TO, READ BACK BY AND VERIFIED WITH: L CURRAN PHARMD 1612 11/29/18 A BROWNING    Methicillin resistance DETECTED (A) NOT DETECTED Final    Comment: CRITICAL RESULT CALLED TO, READ BACK BY AND VERIFIED WITH: L CURRAN PHARMD 1612 11/29/18 A BROWNING    Streptococcus species NOT DETECTED NOT DETECTED Final   Streptococcus agalactiae NOT DETECTED NOT DETECTED Final   Streptococcus pneumoniae NOT DETECTED NOT DETECTED Final   Streptococcus pyogenes NOT DETECTED NOT DETECTED Final   Acinetobacter baumannii NOT DETECTED NOT DETECTED Final   Enterobacteriaceae species NOT DETECTED NOT DETECTED Final   Enterobacter cloacae complex NOT DETECTED NOT DETECTED Final   Escherichia coli NOT DETECTED NOT DETECTED Final   Klebsiella oxytoca NOT DETECTED NOT DETECTED Final   Klebsiella pneumoniae NOT DETECTED NOT DETECTED Final   Proteus species NOT DETECTED NOT DETECTED Final   Serratia marcescens NOT DETECTED NOT DETECTED Final   Haemophilus influenzae NOT DETECTED NOT DETECTED Final   Neisseria meningitidis NOT DETECTED NOT DETECTED Final   Pseudomonas aeruginosa NOT DETECTED NOT DETECTED Final   Candida albicans NOT DETECTED NOT DETECTED Final   Candida glabrata NOT DETECTED NOT DETECTED Final   Candida krusei NOT DETECTED NOT DETECTED Final   Candida parapsilosis NOT  DETECTED NOT DETECTED Final   Candida tropicalis NOT DETECTED NOT DETECTED Final    Comment: Performed at [facility], [address], [place], [place] [address]  Culture, blood (routine x 2)     Status: Abnormal   Collection Time: 11/28/18  7:29 PM  Result Value Ref Range Status   Specimen Description BLOOD LEFT ARM  Final   Special Requests   Final    BOTTLES DRAWN AEROBIC AND ANAEROBIC Blood Culture adequate volume   Culture  Setup Time   Final    GRAM POSITIVE COCCI IN BOTH AEROBIC AND ANAEROBIC BOTTLES CRITICAL VALUE NOTED.  VALUE IS CONSISTENT WITH PREVIOUSLY REPORTED AND CALLED VALUE.    Culture (A)  Final    STAPHYLOCOCCUS AUREUS SUSCEPTIBILITIES PERFORMED ON PREVIOUS CULTURE WITHIN THE LAST 5 DAYS. Performed at [facility], [address] [place], [place] [address]    Report Status 12/01/2018 FINAL  Final  Culture, Urine     Status: None   Collection Time: 11/29/18 12:58 PM  Result Value Ref Range Status   Specimen Description URINE, CATHETERIZED  Final   Special Requests NONE  Final   Culture   Final    NO GROWTH Performed at [facility], [address], [place], [place] [address]    Report Status 11/30/2018 FINAL  Final  Culture, blood (routine x 2)     Status: None (Preliminary result)   Collection Time: 11/30/18  3:22 PM  Result Value Ref Range Status   Specimen Description BLOOD LEFT ANTECUBITAL  Final   Special Requests   Final    BOTTLES DRAWN AEROBIC ONLY Blood Culture adequate volume   Culture   Final    NO GROWTH 4 DAYS Performed at [facility], [address] [place], [place] [address]    Report Status PENDING  Incomplete  Culture, blood (routine x 2)     Status: None (Preliminary result)   Collection Time: 11/30/18  3:22 PM  Result Value Ref Range Status   Specimen Description BLOOD RIGHT HAND  Final   Special  Requests   Final    BOTTLES DRAWN AEROBIC ONLY Blood Culture adequate volume   Culture   Final    NO GROWTH 4  DAYS Performed at [facility], [address] [place], [place] [address]    Report Status PENDING  Incomplete    Lanelle Bal, MD [facility] for Infectious Disease [facility] [phone] pager   [phone] cell 12/04/2018, 9:54 AM

## 2018-12-05 ENCOUNTER — Inpatient Hospital Stay (HOSPITAL_COMMUNITY): Payer: Medicaid Other

## 2018-12-05 ENCOUNTER — Encounter (HOSPITAL_COMMUNITY): Payer: Self-pay | Admitting: Cardiology

## 2018-12-05 ENCOUNTER — Other Ambulatory Visit: Payer: Self-pay | Admitting: Family

## 2018-12-05 LAB — CULTURE, BLOOD (ROUTINE X 2)
Culture: NO GROWTH
Culture: NO GROWTH
Special Requests: ADEQUATE
Special Requests: ADEQUATE

## 2018-12-05 LAB — GLUCOSE, CAPILLARY
Glucose-Capillary: 93 mg/dL (ref 70–99)
Glucose-Capillary: 115 mg/dL — ABNORMAL HIGH (ref 70–99)
Glucose-Capillary: 204 mg/dL — ABNORMAL HIGH (ref 70–99)
Glucose-Capillary: 119 mg/dL — ABNORMAL HIGH (ref 70–99)

## 2018-12-05 LAB — VANCOMYCIN, RANDOM: Vancomycin Rm: 38

## 2018-12-05 LAB — VANCOMYCIN, TROUGH: Vancomycin Tr: 18 ug/mL (ref 15–20)

## 2018-12-05 MED ORDER — VANCOMYCIN HCL IN DEXTROSE 1-5 GM/200ML-% IV SOLN
1000.0000 mg | Freq: Two times a day (BID) | INTRAVENOUS | Status: DC
  Administered 2018-12-05 – 2018-12-10 (×9): 1000 mg via INTRAVENOUS
  Filled 2018-12-05 (×9): qty 200

## 2018-12-05 NOTE — Progress Notes (Signed)
PHARMACY CONSULT NOTE FOR:  OUTPATIENT  PARENTERAL ANTIBIOTIC THERAPY (OPAT)  Indication: MRSA Bacteremia Regimen: Vancomycin 1000 mg IV q12h End date: 12/25/18  IV antibiotic discharge orders are pended. To discharging provider:  please sign these orders via discharge navigator,  Select New Orders & click on the button choice - Manage This Unsigned Work.    Thank you for allowing pharmacy to be a part of this patient's care.  Lenord Carbo, PharmD PGY1 Pharmacy Resident Phone: [phone]  Please check AMION for all [facility] Pharmacy phone numbers 12/05/2018, 1:43 PM

## 2018-12-05 NOTE — Progress Notes (Signed)
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  On 5/20, patient presented at [facility] with complaint of worsening pain and swelling of her right foot for 1 week.  CT scanning was obtained which was concerning for destruction of the entire mid and hindfoot with fluid and gas collections, surrounding cellulitis. She was transferred to [facility] as a direct admit.  On presentation, patient was septic with a heart rate 80s, blood pressure in 80s and lactate elevated to 5.7. Infectious disease and orthopedic consult were obtained.  Assessment & Plan:   Principal Problem:   MRSA bacteremia Active Problems:   Diabetes mellitus type 2, uncontrolled, with complications ([place])   Charcot foot due to diabetes mellitus ([place])   Severe protein-calorie malnutrition ([place])   Diabetic polyneuropathy associated with type 2 diabetes mellitus ([place])   Cigarette smoker   Subacute osteomyelitis, right ankle and foot ([place])   Cutaneous abscess of right foot   Sepsis, present on admission MRSA septicemia Diabetic right foot infection, osteomyelitis Patient presenting as a transfer from [facility] rocking him with worsening pain and swelling over right foot x1 week.  CT foot notable for destruction of the entire midfoot and hindfoot with fluid and gas collections and surrounding cellulitis.  ESR 136, CRP 22.1.  Lactic acid 1.4.  Blood Cultures from 020 2 out of 2 positive for MRSA. --Orthopedics, Dr. 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 --Infectious disease following, appreciate assistance --TEE on 5/22 with no indication of endocarditis --repeat blood cultures x 2 on 5/22 negative --ID recommends to complete 4 weeks of IV vancomycin, through 12/25/2018 --PICC line placed --Goal vancomycin trough 15-20; pharmacy for monitoring/dosing  Type 2 diabetes mellitus Hemoglobin A1c 7.9.  Home regimen includes Humalog 75/25 30u BID and NovoLog 3 units 3 times daily AC. --Continue Lantus 20 units No Name BID --Novolog 6u TIDAC --Moderate dose insulin sliding scale for further coverage --continue CBG's qAC/HS  Left wrist edema Patient with moderate edema to left wrist.  No pain on active/passive range of motion.  Not warm to touch.  Initially concern for possible gout flare, although uric acid normal at 3.2.  Left wrist x-ray showing severe soft tissue swelling without fracture, subluxation, or dislocation.  ID believes that she has partially treated septic arthritis of her wrist. --MRI of left wrist pending --Supportive care.  Essential hypertension BP stable  Right lower extremity DVT -Diagnosed in 07/2018, used to be on Xarelto, she completed the starter pack however Xarelto was discontinued due to bleeding issues at [facility] in 09/2018. Right lower extremity Dopplers on 3/29 did not show any DVT hence Xarelto was not resumed.  Hypercalcemia: resolved Likely due to sepsis, dehydration, calcium 10.5 with a time of admission  Severe protein calorie malnutrition Albumin 1.2 --Placed on pro-stat, nutritional supplements  Acute on chronic anemia  --H&H currently stable baseline 9-10 --Hemoglobin 7.4 at OSH, transfused 1 unit packed RBC,  --H&H 7.7 this morning. --Continue to monitor hemoglobin and transfuse for less than 7.  DVT prophylaxis: Heparin Code Status: Full code Family  Communication: none Disposition Plan: Pending SNF placement.   Consultants:   Orthopedics, Dr. Sharol Given  Infectious disease, Dr. Megan Salon  Procedures:   Transtibial amputation right lower extremity 11/30/2018  PICC line placement, RUE 5/26  Antimicrobials:   Vancomycin 11/28/2018>>>   Subjective: Patient seen and examined at bedside, resting comfortably in bed.  Continues with left wrist swelling, nonpainful to range of motion.  No other complaints this morning.  Denies headache, no fever/chills/night sweats, no nausea/vomiting/diarrhea, no chest pain, no palpitations, no abdominal pain.  No acute events overnight per nursing staff.  Objective: Vitals:   12/05/18 0450 12/05/18 0800 12/05/18 0853 12/05/18 1227  BP: (!) 125/96 (!) 128/93  (!) 141/97  Pulse: (!) 107   (!) 105  Resp: 18  19 (!) 25  Temp:  97.7 F (36.5 C)  97.8 F (36.6 C)  TempSrc:  Oral  Oral  SpO2: 100%  100%   Weight:      Height:        Intake/Output Summary (Last 24 hours) at 12/05/2018 1443 Last data filed at 12/04/2018 2235 Gross per 24 hour  Intake 280 ml  Output -  Net 280 ml   Filed Weights   12/02/18 0600 12/03/18 0641 12/04/18 0657  Weight: 80.9 kg 80 kg 77.6 kg    Examination:  General exam: Appears calm and comfortable  Respiratory system: Clear to auscultation. Respiratory effort normal. Cardiovascular system: S1 & S2 heard, RRR. No JVD, murmurs, rubs, gallops or clicks. No pedal edema. Gastrointestinal system: Abdomen is nondistended, soft and nontender. No organomegaly or masses felt. Normal bowel sounds heard. Central nervous system: Alert and oriented. No focal neurological deficits. Extremities: Edema noted to left wrist, no erythema or fluctuance, dressing in place to right lower extremity Skin: No rashes, lesions or ulcers Psychiatry: Judgement and insight appear normal. Mood & affect appropriate.     Data Reviewed: I have personally reviewed following labs and imaging  studies  CBC: Recent Labs  Lab 11/29/18 0328 11/30/18 0319 12/01/18 0301 12/02/18 0258 12/04/18 0259  WBC 20.2* 21.8* 25.3* 21.0* 22.0*  HGB 8.9* 8.2* 7.6* 7.6* 7.7*  HCT 26.4* 24.2* 22.8* 22.9* 23.7*  MCV 90.4 90.3 90.8 91.6 93.3  PLT 363 356 334 346 329   Basic Metabolic Panel: Recent Labs  Lab 11/29/18 0328 11/30/18 0319 12/01/18 0301 12/02/18 0258 12/04/18 0259  NA 135 137 133* 137 139  K 4.0 3.4* 3.8 3.5 3.4*  CL 106 107 107 108 110  CO2 23 23 21* 24 24  GLUCOSE 247* 93 341* 119* 78  BUN '17 12 9 12 9  ' CREATININE 0.97 0.91 0.76 0.65 0.51  CALCIUM 9.7  9.6 9.2 8.3* 7.9* 7.3*   GFR: Estimated Creatinine Clearance: 96.3 mL/min (by C-G formula based on SCr of 0.51 mg/dL). Liver Function Tests: Recent Labs  Lab 11/28/18 1912 11/29/18 0328  AST 33 32  ALT 22 20  ALKPHOS 287* 289*  BILITOT 3.0* 3.3*  PROT 8.6* 7.5  ALBUMIN 1.4* 1.2*   No results for input(s): LIPASE, AMYLASE in the last 168 hours. No results for input(s): AMMONIA in the last 168 hours. Coagulation Profile: No results for input(s): INR, PROTIME in the last 168 hours. Cardiac Enzymes: No results for input(s): CKTOTAL, CKMB, CKMBINDEX, TROPONINI in the last 168 hours. BNP (last 3 results) No results for input(s): PROBNP in the last 8760 hours. HbA1C: No results for input(s): HGBA1C in the last 72 hours. CBG:  Recent Labs  Lab 12/04/18 1700 12/04/18 2207 12/04/18 2253 12/05/18 0759 12/05/18 1225  GLUCAP 129* 160* 159* 93 115*   Lipid Profile: No results for input(s): CHOL, HDL, LDLCALC, TRIG, CHOLHDL, LDLDIRECT in the last 72 hours. Thyroid Function Tests: No results for input(s): TSH, T4TOTAL, FREET4, T3FREE, THYROIDAB in the last 72 hours. Anemia Panel: No results for input(s): VITAMINB12, FOLATE, FERRITIN, TIBC, IRON, RETICCTPCT in the last 72 hours. Sepsis Labs: Recent Labs  Lab 11/28/18 1912  LATICACIDVEN 1.4    Recent Results (from the past 240 hour(s))  MRSA PCR  Screening     Status: Abnormal   Collection Time: 11/28/18  5:30 PM  Result Value Ref Range Status   MRSA by PCR POSITIVE (A) NEGATIVE Final    Comment:        The GeneXpert MRSA Assay (FDA approved for NASAL specimens only), is one component of a comprehensive MRSA colonization surveillance program. It is not intended to diagnose MRSA infection nor to guide or monitor treatment for MRSA infections. RESULT CALLED TO, READ BACK BY AND VERIFIED WITHJefferson Fuel RN 11/28/18 1928 JDW Performed at [facility], [address] [place], [place] [address]   SARS Coronavirus 2 [facility] order, Performed in [facility] hospital lab)     Status: None   Collection Time: 11/28/18  6:29 PM  Result Value Ref Range Status   SARS Coronavirus 2 NEGATIVE NEGATIVE Final    Comment: (NOTE) If result is NEGATIVE SARS-CoV-2 target nucleic acids are NOT DETECTED. The SARS-CoV-2 RNA is generally detectable in upper and lower  respiratory specimens during the acute phase of infection. The lowest  concentration of SARS-CoV-2 viral copies this assay can detect is 250  copies / mL. A negative result does not preclude SARS-CoV-2 infection  and should not be used as the sole basis for treatment or other  patient management decisions.  A negative result may occur with  improper specimen collection / handling, submission of specimen other  than nasopharyngeal swab, presence of viral mutation(s) within the  areas targeted by this assay, and inadequate number of viral copies  (<250 copies / mL). A negative result must be combined with clinical  observations, patient history, and epidemiological information. If result is POSITIVE SARS-CoV-2 target nucleic acids are DETECTED. The SARS-CoV-2 RNA is generally detectable in upper and lower  respiratory specimens dur ing the acute phase of infection.  Positive  results are indicative of active infection with SARS-CoV-2.  Clinical  correlation with patient  history and other diagnostic information is  necessary to determine patient infection status.  Positive results do  not rule out bacterial infection or co-infection with other viruses. If result is PRESUMPTIVE POSTIVE SARS-CoV-2 nucleic acids MAY BE PRESENT.   A presumptive positive result was obtained on the submitted specimen  and confirmed on repeat testing.  While 2019 novel coronavirus  (SARS-CoV-2) nucleic acids may be present in the submitted sample  additional confirmatory testing may be necessary for epidemiological  and / or clinical management purposes  to differentiate between  SARS-CoV-2 and other Sarbecovirus currently known to infect humans.  If clinically indicated additional testing with an alternate test  methodology [phone]) is advised. The SARS-CoV-2 RNA is generally  detectable in upper and lower respiratory sp ecimens during the acute  phase of infection. The expected result is Negative. Fact Sheet for Patients:  StrictlyIdeas.no Fact Sheet for Healthcare Providers: BankingDealers.co.za This test is not yet approved or cleared by the [place]  FDA and has been authorized for detection and/or diagnosis of SARS-CoV-2 by FDA under an Emergency Use Authorization (EUA).  This EUA will remain in effect (meaning this test can be used) for the duration of the COVID-19 declaration under Section 564(b)(1) of the Act, 21 U.S.C. section 360bbb-3(b)(1), unless the authorization is terminated or revoked sooner. Performed at [facility], [place] [address] [place], [place] [address]   Culture, blood (routine x 2)     Status: Abnormal   Collection Time: 11/28/18  6:29 PM  Result Value Ref Range Status   Specimen Description BLOOD RIGHT ARM  Final   Special Requests   Final    BOTTLES DRAWN AEROBIC AND ANAEROBIC Blood Culture adequate volume   Culture  Setup Time   Final    GRAM POSITIVE COCCI IN BOTH AEROBIC AND  ANAEROBIC BOTTLES CRITICAL RESULT CALLED TO, READ BACK BY AND VERIFIED WITHBronwen Betters [facility] 4920 11/29/18 A BROWNING Performed at [facility], [place] [address], [place], [place] [address]    Culture METHICILLIN RESISTANT STAPHYLOCOCCUS AUREUS (A)  Final   Report Status 12/01/2018 FINAL  Final   Organism ID, Bacteria METHICILLIN RESISTANT STAPHYLOCOCCUS AUREUS  Final      Susceptibility   Methicillin resistant staphylococcus aureus - MIC*    CIPROFLOXACIN >=8 RESISTANT Resistant     ERYTHROMYCIN >=8 RESISTANT Resistant     GENTAMICIN <=0.5 SENSITIVE Sensitive     OXACILLIN >=4 RESISTANT Resistant     TETRACYCLINE <=1 SENSITIVE Sensitive     VANCOMYCIN <=0.5 SENSITIVE Sensitive     TRIMETH/SULFA <=10 SENSITIVE Sensitive     CLINDAMYCIN >=8 RESISTANT Resistant     RIFAMPIN <=0.5 SENSITIVE Sensitive     Inducible Clindamycin NEGATIVE Sensitive     * METHICILLIN RESISTANT STAPHYLOCOCCUS AUREUS  Blood Culture ID Panel (Reflexed)     Status: Abnormal   Collection Time: 11/28/18  6:29 PM  Result Value Ref Range Status   Enterococcus species NOT DETECTED NOT DETECTED Final   Listeria monocytogenes NOT DETECTED NOT DETECTED Final   Staphylococcus species DETECTED (A) NOT DETECTED Final    Comment: CRITICAL RESULT CALLED TO, READ BACK BY AND VERIFIED WITH: Bronwen Betters PHARMD 1612 11/29/18 A BROWNING    Staphylococcus aureus (BCID) DETECTED (A) NOT DETECTED Final    Comment: Methicillin (oxacillin)-resistant Staphylococcus aureus (MRSA). MRSA is predictably resistant to beta-lactam antibiotics (except ceftaroline). Preferred therapy is vancomycin unless clinically contraindicated. Patient requires contact precautions if  hospitalized. CRITICAL RESULT CALLED TO, READ BACK BY AND VERIFIED WITH: L CURRAN PHARMD 1612 11/29/18 A BROWNING    Methicillin resistance DETECTED (A) NOT DETECTED Final    Comment: CRITICAL RESULT CALLED TO, READ BACK BY AND VERIFIED WITH: L CURRAN PHARMD 1612 11/29/18 A  BROWNING    Streptococcus species NOT DETECTED NOT DETECTED Final   Streptococcus agalactiae NOT DETECTED NOT DETECTED Final   Streptococcus pneumoniae NOT DETECTED NOT DETECTED Final   Streptococcus pyogenes NOT DETECTED NOT DETECTED Final   Acinetobacter baumannii NOT DETECTED NOT DETECTED Final   Enterobacteriaceae species NOT DETECTED NOT DETECTED Final   Enterobacter cloacae complex NOT DETECTED NOT DETECTED Final   Escherichia coli NOT DETECTED NOT DETECTED Final   Klebsiella oxytoca NOT DETECTED NOT DETECTED Final   Klebsiella pneumoniae NOT DETECTED NOT DETECTED Final   Proteus species NOT DETECTED NOT DETECTED Final   Serratia marcescens NOT DETECTED NOT DETECTED Final   Haemophilus influenzae NOT DETECTED NOT DETECTED Final   Neisseria meningitidis NOT DETECTED  NOT DETECTED Final   Pseudomonas aeruginosa NOT DETECTED NOT DETECTED Final   Candida albicans NOT DETECTED NOT DETECTED Final   Candida glabrata NOT DETECTED NOT DETECTED Final   Candida krusei NOT DETECTED NOT DETECTED Final   Candida parapsilosis NOT DETECTED NOT DETECTED Final   Candida tropicalis NOT DETECTED NOT DETECTED Final    Comment: Performed at [facility], [place] [address] [place], [place] [address]  Culture, blood (routine x 2)     Status: Abnormal   Collection Time: 11/28/18  7:29 PM  Result Value Ref Range Status   Specimen Description BLOOD LEFT ARM  Final   Special Requests   Final    BOTTLES DRAWN AEROBIC AND ANAEROBIC Blood Culture adequate volume   Culture  Setup Time   Final    GRAM POSITIVE COCCI IN BOTH AEROBIC AND ANAEROBIC BOTTLES CRITICAL VALUE NOTED.  VALUE IS CONSISTENT WITH PREVIOUSLY REPORTED AND CALLED VALUE.    Culture (A)  Final    STAPHYLOCOCCUS AUREUS SUSCEPTIBILITIES PERFORMED ON PREVIOUS CULTURE WITHIN THE LAST 5 DAYS. Performed at [facility], [place] [address] [place], [place] [address]    Report Status 12/01/2018 FINAL  Final  Culture, Urine     Status: None    Collection Time: 11/29/18 12:58 PM  Result Value Ref Range Status   Specimen Description URINE, CATHETERIZED  Final   Special Requests NONE  Final   Culture   Final    NO GROWTH Performed at [facility], [place] [address], [place], [place] [address]    Report Status 11/30/2018 FINAL  Final  Culture, blood (routine x 2)     Status: None   Collection Time: 11/30/18  3:22 PM  Result Value Ref Range Status   Specimen Description BLOOD LEFT ANTECUBITAL  Final   Special Requests   Final    BOTTLES DRAWN AEROBIC ONLY Blood Culture adequate volume   Culture   Final    NO GROWTH 5 DAYS Performed at [facility], [place] [address] [place], [place] [address]    Report Status 12/05/2018 FINAL  Final  Culture, blood (routine x 2)     Status: None   Collection Time: 11/30/18  3:22 PM  Result Value Ref Range Status   Specimen Description BLOOD RIGHT HAND  Final   Special Requests   Final    BOTTLES DRAWN AEROBIC ONLY Blood Culture adequate volume   Culture   Final    NO GROWTH 5 DAYS Performed at [facility], [place] [address] [place], [place] [address]    Report Status 12/05/2018 FINAL  Final         Radiology Studies: Dg Wrist 2 Views Left  Result Date: 12/04/2018 CLINICAL DATA:  43 year old female with history of swelling in the left wrist. EXAM: LEFT WRIST - 2 VIEW COMPARISON:  No priors. FINDINGS: There is no evidence of fracture or dislocation. There is no evidence of arthropathy or other focal bone abnormality. Diffuse soft tissue swelling around the wrist. IMPRESSION: 1. Severe soft tissue swelling around the left wrist with no underlying displaced fracture, subluxation or dislocation. Electronically Signed   By: Vinnie Langton M.D.   On: 12/04/2018 18:09        Scheduled Meds: . colchicine  0.6 mg Oral BID  . docusate sodium  100 mg Oral BID  . heparin injection (subcutaneous)  5,000 Units Subcutaneous Q8H  . insulin aspart  0-15 Units Subcutaneous TID  WC  .  insulin aspart  0-5 Units Subcutaneous QHS  . insulin aspart  6 Units Subcutaneous TID WC  . insulin glargine  20 Units Subcutaneous BID  . multivitamin with minerals  1 tablet Oral Daily  . Ensure Max Protein  11 oz Oral Daily   Continuous Infusions: . sodium chloride 250 mL (11/30/18 0650)  . sodium chloride 10 mL/hr at 12/04/18 2231  . lactated ringers Stopped (11/30/18 1330)  . methocarbamol (ROBAXIN) IV    . vancomycin       LOS: 7 days    Time spent: 36 minutes    Sydnie Sigmund J [place] DO [facility] Pager [phone]  If 7PM-7AM, please contact night-coverage [email or web address] Password [facility] 12/05/2018, 2:43 PM

## 2018-12-05 NOTE — Progress Notes (Signed)
[facility]  Date of Admission:  11/28/2018   Total days of antibiotics 8        Day 8 vancomycin         ASSESSMENT: MRSA bacteremia secondary to osteomyelitis of the right ankle and foot status post transtibial amputation of the right foot.  Patient continues to improve on IV vancomycin with persistent leukocytosis on CBC.  Now with rapidly worsening left wrist inflammation concerning for septic arthritis versus gout. The probability of gout if very low in this patient and in this clinical setting. Uric acid level 3.2 which can be seen in an acute flare.  Patient was seen with Dr. Lajoyce Cornersuda of orthopedics who ordered an MRI of the left wrist after plain films were unremarkable.  Patient may need I&D of the left wrist if MRI represents infectious process as per orthopedics. Patient's prolonged prior treatment course for MRSA bacteremia and persistence of the infection the patient will require a total of 4 weeks of IV vancomycin. TEE inconsistent with bacterial vegetation or valvular abnormalities, no indication for longer course antibiotics.  PLAN: 1. Continue IV vancomycin to complete a four-week treatment course, last day December 25, 2018  Principal Problem:   MRSA bacteremia Active Problems:   Subacute osteomyelitis, right ankle and foot (HCC)   Diabetes mellitus type 2, uncontrolled, with complications (HCC)   Charcot foot due to diabetes mellitus (HCC)   Cigarette smoker   Severe protein-calorie malnutrition (HCC)   Diabetic polyneuropathy associated with type 2 diabetes mellitus (HCC)   Cutaneous abscess of right foot   Scheduled Meds: . colchicine  0.6 mg Oral BID  . docusate sodium  100 mg Oral BID  . heparin injection (subcutaneous)  5,000 Units Subcutaneous Q8H  . insulin aspart  0-15 Units Subcutaneous TID WC  . insulin aspart  0-5 Units Subcutaneous QHS  . insulin aspart  6 Units Subcutaneous TID WC  . insulin glargine  20 Units Subcutaneous  BID  . multivitamin with minerals  1 tablet Oral Daily  . Ensure Max Protein  11 oz Oral Daily  . tamsulosin  0.4 mg Oral QPC breakfast   Continuous Infusions: . sodium chloride 250 mL (11/30/18 0650)  . sodium chloride 10 mL/hr at 12/04/18 2231  . lactated ringers Stopped (11/30/18 1330)  . methocarbamol (ROBAXIN) IV    . vancomycin 1,250 mg (12/04/18 2235)   PRN Meds:.sodium chloride, acetaminophen **OR** acetaminophen, bisacodyl, HYDROmorphone (DILAUDID) injection, magnesium citrate, methocarbamol **OR** methocarbamol (ROBAXIN) IV, metoCLOPramide **OR** metoCLOPramide (REGLAN) injection, ondansetron **OR** ondansetron (ZOFRAN) IV, oxyCODONE, oxyCODONE, polyethylene glycol, sodium chloride flush   SUBJECTIVE: Patient said that she is feeling much improved today as compared to when she was admitted.  She endorses that she has experienced bilateral wrist pain in the past but did not recall her hand being swollen and tender.  She states that her current left wrist inflammation is tremendously worse than she is ever seen before.  She denies severe pain in the left wrist except with utilization of her left hand.  He denies numbness or tingling of the left wrist or fingers.  She endorses intact sensation of the left hand.  Patient states that inflammation was present on admission but has progressively worsened iat a rapid rate over the past few days.  She denied a history of gout in the past and stated that she has never had inflammation of any of her other joints that she can recall, particularly  to this extent.   Review of Systems: ROS negative except as per HPI  No Known Allergies  OBJECTIVE: Vitals:   12/04/18 2310 12/05/18 0024 12/05/18 0450 12/05/18 0800  BP: (!) 130/95  (!) 125/96 (!) 128/93  Pulse: 96  (!) 107   Resp: 18  18   Temp:  98.6 F (37 C)  97.7 F (36.5 C)  TempSrc:  Oral  Oral  SpO2: 98%  100%   Weight:      Height:       Body mass index is 27.61 kg/m.  Physical  Exam Constitutional:      General: She is not in acute distress.    Appearance: Normal appearance. She is not ill-appearing.  Cardiovascular:     Rate and Rhythm: Normal rate and regular rhythm.  Pulmonary:     Effort: Pulmonary effort is normal.     Breath sounds: Normal breath sounds. No wheezing.  Abdominal:     General: Abdomen is flat. Bowel sounds are normal. There is no distension.  Musculoskeletal:        General: Swelling (At this site her left wrist), tenderness and deformity present.  Skin:    General: Skin is warm.     Findings: Bruising (Left wrist and lower portion of the palm of her hand) and erythema present.  Neurological:     Mental Status: She is alert and oriented to person, place, and time.     Lab Results Lab Results  Component Value Date   WBC 22.0 (H) 12/04/2018   HGB 7.7 (L) 12/04/2018   HCT 23.7 (L) 12/04/2018   MCV 93.3 12/04/2018   PLT 366 12/04/2018    Lab Results  Component Value Date   CREATININE 0.51 12/04/2018   BUN 9 12/04/2018   NA 139 12/04/2018   K 3.4 (L) 12/04/2018   CL 110 12/04/2018   CO2 24 12/04/2018    Lab Results  Component Value Date   ALT 20 11/29/2018   AST 32 11/29/2018   ALKPHOS 289 (H) 11/29/2018   BILITOT 3.3 (H) 11/29/2018     Microbiology: Recent Results (from the past 240 hour(s))  MRSA PCR Screening     Status: Abnormal   Collection Time: 11/28/18  5:30 PM  Result Value Ref Range Status   MRSA by PCR POSITIVE (A) NEGATIVE Final    Comment:        The GeneXpert MRSA Assay (FDA approved for NASAL specimens only), is one component of a comprehensive MRSA colonization surveillance program. It is not intended to diagnose MRSA infection nor to guide or monitor treatment for MRSA infections. RESULT CALLED TO, READ BACK BY AND VERIFIED WITHSilverio Decamp RN 11/28/18 1928 JDW Performed at [facility], [address] [place], [place] [address]   SARS Coronavirus 2 [facility] order, Performed in [facility] hospital lab)     Status: None   Collection Time: 11/28/18  6:29 PM  Result Value Ref Range Status   SARS Coronavirus 2 NEGATIVE NEGATIVE Final    Comment: (NOTE) If result is NEGATIVE SARS-CoV-2 target nucleic acids are NOT DETECTED. The SARS-CoV-2 RNA is generally detectable in upper and lower  respiratory specimens during the acute phase of infection. The lowest  concentration of SARS-CoV-2 viral copies this assay can detect is 250  copies / mL. A negative result does not preclude SARS-CoV-2 infection  and should not be used as the sole basis for treatment or other  patient management decisions.  A negative result may occur with  improper specimen collection / handling, submission of specimen other  than nasopharyngeal swab, presence of viral mutation(s) within the  areas targeted by this assay, and inadequate number of viral copies  (<250 copies / mL). A negative result must be combined with clinical  observations, patient history, and epidemiological information. If result is POSITIVE SARS-CoV-2 target nucleic acids are DETECTED. The SARS-CoV-2 RNA is generally detectable in upper and lower  respiratory specimens dur ing the acute phase of infection.  Positive  results are indicative of active infection with SARS-CoV-2.  Clinical  correlation with patient history and other diagnostic information is  necessary to determine patient infection status.  Positive results do  not rule out bacterial infection or co-infection with other viruses. If result is PRESUMPTIVE POSTIVE SARS-CoV-2 nucleic acids MAY BE PRESENT.   A presumptive positive result was obtained on the submitted specimen  and confirmed on repeat testing.  While 2019 novel coronavirus  (SARS-CoV-2) nucleic acids may be present in the submitted sample  additional confirmatory testing may be necessary for epidemiological  and / or clinical management purposes  to differentiate between  SARS-CoV-2 and other  Sarbecovirus currently known to infect humans.  If clinically indicated additional testing with an alternate test  methodology [phone]) is advised. The SARS-CoV-2 RNA is generally  detectable in upper and lower respiratory sp ecimens during the acute  phase of infection. The expected result is Negative. Fact Sheet for Patients:  BoilerBrush.com.cy Fact Sheet for Healthcare Providers: https://pope.com/ This test is not yet approved or cleared by the [place] FDA and has been authorized for detection and/or diagnosis of SARS-CoV-2 by FDA under an Emergency Use Authorization (EUA).  This EUA will remain in effect (meaning this test can be used) for the duration of the COVID-19 declaration under Section 564(b)(1) of the Act, 21 U.S.C. section 360bbb-3(b)(1), unless the authorization is terminated or revoked sooner. Performed at [facility], [address], [place], [place] [address]   Culture, blood (routine x 2)     Status: Abnormal   Collection Time: 11/28/18  6:29 PM  Result Value Ref Range Status   Specimen Description BLOOD RIGHT ARM  Final   Special Requests   Final    BOTTLES DRAWN AEROBIC AND ANAEROBIC Blood Culture adequate volume   Culture  Setup Time   Final    GRAM POSITIVE COCCI IN BOTH AEROBIC AND ANAEROBIC BOTTLES CRITICAL RESULT CALLED TO, READ BACK BY AND VERIFIED WITHLelon Huh [facility] 8119 11/29/18 A BROWNING Performed at [facility], [address], [place], [place] [address]    Culture METHICILLIN RESISTANT STAPHYLOCOCCUS AUREUS (A)  Final   Report Status 12/01/2018 FINAL  Final   Organism ID, Bacteria METHICILLIN RESISTANT STAPHYLOCOCCUS AUREUS  Final      Susceptibility   Methicillin resistant staphylococcus aureus - MIC*    CIPROFLOXACIN >=8 RESISTANT Resistant     ERYTHROMYCIN >=8 RESISTANT Resistant     GENTAMICIN <=0.5 SENSITIVE Sensitive     OXACILLIN >=4 RESISTANT Resistant      TETRACYCLINE <=1 SENSITIVE Sensitive     VANCOMYCIN <=0.5 SENSITIVE Sensitive     TRIMETH/SULFA <=10 SENSITIVE Sensitive     CLINDAMYCIN >=8 RESISTANT Resistant     RIFAMPIN <=0.5 SENSITIVE Sensitive     Inducible Clindamycin NEGATIVE Sensitive     * METHICILLIN RESISTANT STAPHYLOCOCCUS AUREUS  Blood Culture ID Panel (Reflexed)     Status: Abnormal   Collection Time: 11/28/18  6:29 PM  Result Value Ref Range Status   Enterococcus species NOT DETECTED NOT DETECTED Final   Listeria monocytogenes NOT DETECTED NOT DETECTED Final   Staphylococcus species DETECTED (A) NOT DETECTED Final    Comment: CRITICAL RESULT CALLED TO, READ BACK BY AND VERIFIED WITH: Lelon Huh PHARMD 1612 11/29/18 A BROWNING    Staphylococcus aureus (BCID) DETECTED (A) NOT DETECTED Final    Comment: Methicillin (oxacillin)-resistant Staphylococcus aureus (MRSA). MRSA is predictably resistant to beta-lactam antibiotics (except ceftaroline). Preferred therapy is vancomycin unless clinically contraindicated. Patient requires contact precautions if  hospitalized. CRITICAL RESULT CALLED TO, READ BACK BY AND VERIFIED WITH: L CURRAN PHARMD 1612 11/29/18 A BROWNING    Methicillin resistance DETECTED (A) NOT DETECTED Final    Comment: CRITICAL RESULT CALLED TO, READ BACK BY AND VERIFIED WITH: L CURRAN PHARMD 1612 11/29/18 A BROWNING    Streptococcus species NOT DETECTED NOT DETECTED Final   Streptococcus agalactiae NOT DETECTED NOT DETECTED Final   Streptococcus pneumoniae NOT DETECTED NOT DETECTED Final   Streptococcus pyogenes NOT DETECTED NOT DETECTED Final   Acinetobacter baumannii NOT DETECTED NOT DETECTED Final   Enterobacteriaceae species NOT DETECTED NOT DETECTED Final   Enterobacter cloacae complex NOT DETECTED NOT DETECTED Final   Escherichia coli NOT DETECTED NOT DETECTED Final   Klebsiella oxytoca NOT DETECTED NOT DETECTED Final   Klebsiella pneumoniae NOT DETECTED NOT DETECTED Final   Proteus species NOT DETECTED  NOT DETECTED Final   Serratia marcescens NOT DETECTED NOT DETECTED Final   Haemophilus influenzae NOT DETECTED NOT DETECTED Final   Neisseria meningitidis NOT DETECTED NOT DETECTED Final   Pseudomonas aeruginosa NOT DETECTED NOT DETECTED Final   Candida albicans NOT DETECTED NOT DETECTED Final   Candida glabrata NOT DETECTED NOT DETECTED Final   Candida krusei NOT DETECTED NOT DETECTED Final   Candida parapsilosis NOT DETECTED NOT DETECTED Final   Candida tropicalis NOT DETECTED NOT DETECTED Final    Comment: Performed at [facility], [address], [place], [place] [address]  Culture, blood (routine x 2)     Status: Abnormal   Collection Time: 11/28/18  7:29 PM  Result Value Ref Range Status   Specimen Description BLOOD LEFT ARM  Final   Special Requests   Final    BOTTLES DRAWN AEROBIC AND ANAEROBIC Blood Culture adequate volume   Culture  Setup Time   Final    GRAM POSITIVE COCCI IN BOTH AEROBIC AND ANAEROBIC BOTTLES CRITICAL VALUE NOTED.  VALUE IS CONSISTENT WITH PREVIOUSLY REPORTED AND CALLED VALUE.    Culture (A)  Final    STAPHYLOCOCCUS AUREUS SUSCEPTIBILITIES PERFORMED ON PREVIOUS CULTURE WITHIN THE LAST 5 DAYS. Performed at [facility], [address], [place], [place] [address]    Report Status 12/01/2018 FINAL  Final  Culture, Urine     Status: None   Collection Time: 11/29/18 12:58 PM  Result Value Ref Range Status   Specimen Description URINE, CATHETERIZED  Final   Special Requests NONE  Final   Culture   Final    NO GROWTH Performed at [facility], [address], [place], [place] [address]    Report Status 11/30/2018 FINAL  Final  Culture, blood (routine x 2)     Status: None (Preliminary result)   Collection Time: 11/30/18  3:22 PM  Result Value Ref Range Status   Specimen Description BLOOD LEFT ANTECUBITAL  Final   Special Requests   Final    BOTTLES DRAWN AEROBIC ONLY  Blood Culture adequate volume   Culture   Final    NO GROWTH 4  DAYS Performed at [facility], [address], [place], [place] [address]    Report Status PENDING  Incomplete  Culture, blood (routine x 2)     Status: None (Preliminary result)   Collection Time: 11/30/18  3:22 PM  Result Value Ref Range Status   Specimen Description BLOOD RIGHT HAND  Final   Special Requests   Final    BOTTLES DRAWN AEROBIC ONLY Blood Culture adequate volume   Culture   Final    NO GROWTH 4 DAYS Performed at [facility], [address], [place], [place] [address]    Report Status PENDING  Incomplete    Lanelle Bal, MD [facility] for Infectious Disease [facility] [phone] pager   [phone] cell 12/05/2018, 8:04 AM

## 2018-12-05 NOTE — Progress Notes (Signed)
Patient ID: Yolanda [name], female   DOB: Feb 28, 1976, 43 y.o.   MRN: [id number] Patient's radiographs shows no destructive bony changes, subluxation, or fractures.  White cell count 22,000 uric acid within normal range.  I have ordered an MRI scan of the left wrist.  If this is suggestive for septic wrist would recommend hand surgery consultation.

## 2018-12-05 NOTE — Progress Notes (Signed)
Physical Therapy Treatment Patient Details Name: Yolanda [name] MRN: [id number] DOB: [birth year] Today's Date: 12/05/2018    History of Present Illness Pt is a 43 y.o. F with significant PMH of diabetes type 2, diabetic foot ulcer, who presents with sepsis with right foot infection, osteomyelitis and MRSA bacteremia and presumed endocarditis. 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 Pt now with L hand swelling and pain and awaiting MRI on hand to see if any infectious process. X ray was negative for fracture.     PT Comments    Pt with increased wrist pain and awaiting MRI, therefore OOB mobility deferred. Session focused on LE HEP this session and pt tolerated well. Current recommendations appropriate. Educated that CSW would give options for placement. Will continue to follow acutely to maximize functional mobility independence and safety.     Follow Up Recommendations  SNF;Supervision/Assistance - 24 hour     Equipment Recommendations  Wheelchair (measurements PT);Wheelchair cushion (measurements PT);3in1 (PT);Other (comment)(drop arm BSC )    Recommendations for Other Services       Precautions / Restrictions Precautions Precautions: Fall Precaution Comments: R BKA, wound vac Restrictions Weight Bearing Restrictions: Yes RLE Weight Bearing: Non weight bearing    Mobility  Bed Mobility               General bed mobility comments: OOB mobility deferred secondary to L hand pain. Wanted to limit amount of use as pt awaiting MRI.   Transfers                    Ambulation/Gait                 Stairs             Wheelchair Mobility    Modified Rankin (Stroke Patients Only)       Balance                                            Cognition Arousal/Alertness: Awake/alert Behavior During Therapy: WFL for tasks assessed/performed Overall Cognitive Status: Impaired/Different from baseline Area of Impairment: Problem  solving;Safety/judgement;Awareness                         Safety/Judgement: Decreased awareness of safety;Decreased awareness of deficits Awareness: Emergent Problem Solving: Requires verbal cues;Difficulty sequencing        Exercises General Exercises - Lower Extremity Ankle Circles/Pumps: AROM;Left;20 reps;Supine Quad Sets: AROM;Left;10 reps;Supine Gluteal Sets: AROM;Both;10 reps;Supine Heel Slides: AROM;Left;10 reps;Supine Hip ABduction/ADduction: AROM;Left;10 reps;Supine Straight Leg Raises: AROM;Left;10 reps;Supine Amputee Exercises Quad Sets: AROM;Right;10 reps;Supine Hip ABduction/ADduction: AROM;Right;10 reps;Supine Straight Leg Raises: AROM;Right;10 reps;Supine    General Comments        Pertinent Vitals/Pain Pain Assessment: 0-10 Pain Score: 8  Pain Location: L hand  Pain Descriptors / Indicators: Discomfort Pain Intervention(s): Limited activity within patient's tolerance;Monitored during session;Repositioned    Home Living                      Prior Function            PT Goals (current goals can now be found in the care plan section) Acute Rehab PT Goals Patient Stated Goal: "for my hand to stop hurting"  PT Goal Formulation: With patient Time For Goal Achievement: 12/15/18 Potential to Achieve Goals: Good Progress towards PT  goals: Progressing toward goals    Frequency    Min 3X/week      PT Plan Current plan remains appropriate    Co-evaluation              AM-PAC PT "6 Clicks" Mobility   Outcome Measure  Help needed turning from your back to your side while in a flat bed without using bedrails?: A Little Help needed moving from lying on your back to sitting on the side of a flat bed without using bedrails?: A Little Help needed moving to and from a bed to a chair (including a wheelchair)?: A Lot Help needed standing up from a chair using your arms (e.g., wheelchair or bedside chair)?: A Lot Help needed to walk  in hospital room?: Total Help needed climbing 3-5 steps with a railing? : Total 6 Click Score: 12    End of Session   Activity Tolerance: Patient tolerated treatment well Patient left: in bed;with call bell/phone within reach;with bed alarm set Nurse Communication: Mobility status PT Visit Diagnosis: Other abnormalities of gait and mobility (R26.89);Pain Pain - Right/Left: Left Pain - part of body: Hand     Time: [id number] PT Time Calculation (min) (ACUTE ONLY): 14 min  Charges:  $Therapeutic Exercise: 8-22 mins                     Gladys DammeBrittany Aniel Hubble, PT, DPT  Acute Rehabilitation Services  Pager: [phone] Office: [phone]    Lehman PromBrittany S Mailen Newborn 12/05/2018, 12:54 PM

## 2018-12-05 NOTE — H&P (View-Only) (Signed)
Patient ID: Yolanda [name], female   DOB: 12/26/1975, 42 y.o.   MRN: [id number] Patient's radiographs shows no destructive bony changes, subluxation, or fractures.  White cell count 22,000 uric acid within normal range.  I have ordered an MRI scan of the left wrist.  If this is suggestive for septic wrist would recommend hand surgery consultation. 

## 2018-12-05 NOTE — Progress Notes (Signed)
Nutrition Follow-up  RD working remotely.  DOCUMENTATION CODES:   Not applicable  INTERVENTION:   -Continue Ensure Max po daily, each supplement provides 150 kcal and 30 grams of protein -Continue MVI with minerals daily  NUTRITION DIAGNOSIS:   Increased nutrient needs related to wound healing, post-op healing as evidenced by estimated needs.  Ongoing  GOAL:   Patient will meet greater than or equal to 90% of their needs  Progressing  MONITOR:   PO intake, Supplement acceptance, Labs, Weight trends, Skin, I & O's  REASON FOR ASSESSMENT:   Malnutrition Screening Tool, Consult Wound healing  ASSESSMENT:   Yolanda [name] is a 43 y.o. female with medical history significant of T2DM, diabetic foot ulcer, osteomyelitis and cellulitis with MRSA bacteremia, presumed endocarditis presenting with worsening right lower extremity pain and swelling.  5/22- s/p PROCEDURE: Transtibial amputation Application of Prevena wound VAC 5/26- PICC placed, s/p TEE- no endocarditis  Reviewed I/O's: +280 ml x 24 hours and +3.7 L since admission  Per orthopedics notes, pt with increased swelling of lt wrist (x-rays reveal no bony changes, subluxation, or fractures). Plan for MRI of lt wrist and hand surgery consultation if suggestive for septic wrist.   Pt remains with good appetite; noted 100% meal completion. Pt is also accepting on MVI and Ensure Max supplements.   Per therapy notes, plan for SNF at discharge.   Labs reviewed: CBGS: 93-159 (inpatient orders for glycemic control are 0-15 units insulin aspart TID with meals, 0-5 units insulin aspart q HS, 6 units insulin aspart TID with meals, and 20 units insulin glargine BID).   Diet Order:   Diet Order            Diet Carb Modified Fluid consistency: Thin; Room service appropriate? Yes  Diet effective now              EDUCATION NEEDS:   No education needs have been identified at this time  Skin:  Skin Assessment: Skin  Integrity Issues: Skin Integrity Issues:: Wound VAC Wound Vac: rt BKA site Other: s/p rt BKA  Last BM:  12/04/18  Height:   Ht Readings from Last 1 Encounters:  11/28/18 5\' 6"  (1.676 m)    Weight:   Wt Readings from Last 1 Encounters:  12/04/18 77.6 kg    Ideal Body Weight:  55.3 kg(adjusted for rt BKA)  BMI:  Body mass index is 27.61 kg/m.  Estimated Nutritional Needs:   Kcal:  1700-1900  Protein:  95-110 grams  Fluid:  > 1.7 L    Yolanda [name] A. Mayford Knife, RD, LDN, CDCES Registered Dietitian II Certified Diabetes Care and Education Specialist Pager: [phone] After hours Pager: [phone]

## 2018-12-05 NOTE — Progress Notes (Signed)
Pharmacy Antibiotic Note  Yolanda [name] is a 43 y.o. female admitted on 11/28/2018 with diabetic foot infection.  Pharmacy has been consulted for vancomycin dosing. 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 Per MD note, she failed daptomycin and was transitioned to vancomycin (which was supposed to complete on 11/20/2018). 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     Renal function has been improving throughout hospitalization, SCr 0.51 (est CrCl ~ 96 mL/min). Patient remains afebrile. Plan is to continue vancomycin through 12/25/18. Patient's calculated AUC is 660 (Goal 400-600) on vancomycin 1250 mg IV q12h. Will decrease vancomycin dose.   Plan: Decrease vancomycin to 1000 mg IV q12h (expected AUC 528) Continue treatment through 12/25/18 (OPAT orders entered)  Height: 5\' 6"  (167.6 cm) Weight: 171 lb 1.2 oz (77.6 kg) IBW/kg (Calculated) : 59.3  Temp (24hrs), Avg:98.1 F (36.7 C), Min:97.7 F (36.5 C), Max:98.6 F (37 C)  Recent Labs  Lab 11/28/18 1912 11/29/18 0328 11/30/18 0319 12/01/18 0301 12/02/18 0258 12/04/18 0259 12/05/18 0934  WBC 21.3* 20.2* 21.8* 25.3* 21.0* 22.0*  --   CREATININE 0.89 0.97 0.91 0.76 0.65 0.51  --   LATICACIDVEN 1.4  --   --   --   --   --   --   VANCOTROUGH  --   --   --   --   --   --  18  VANCORANDOM 10  --   --   --   --   --   --     Estimated Creatinine Clearance: 96.3 mL/min (by C-G formula based on SCr of 0.51 mg/dL).    No Known Allergies  Antimicrobials this admission: Cefepime 5/20>>5/25 Vancomycin 5/20>> (12/25/18) Flagyl 5/20>>5/25  Dose adjustments this admission: 5/23: vancomycin increased from 1750 mg IV q24h to 1250 mg IV q12h 5/27: vancomycin decreased from 1250 mg IV q12h to 1000 mg IV q12h  Microbiology results: 5/20: Blood Cxs: MRSA 5/20: MRSA PCR: positive 5/20: COVID: negative 5/21 UCx: ngF 5/22 BCx: ngF  Thank you for allowing pharmacy to be a part  of this patient's care.  Lenord Carbo, PharmD PGY1 Pharmacy Resident Phone: [phone]  Please check AMION for all [facility] Pharmacy phone numbers 12/05/2018       1:06 PM

## 2018-12-06 DIAGNOSIS — M71032 Abscess of bursa, left wrist: Secondary | ICD-10-CM

## 2018-12-06 LAB — GLUCOSE, CAPILLARY
Glucose-Capillary: 192 mg/dL — ABNORMAL HIGH (ref 70–99)
Glucose-Capillary: 230 mg/dL — ABNORMAL HIGH (ref 70–99)
Glucose-Capillary: 431 mg/dL — ABNORMAL HIGH (ref 70–99)
Glucose-Capillary: 273 mg/dL — ABNORMAL HIGH (ref 70–99)
Glucose-Capillary: 133 mg/dL — ABNORMAL HIGH (ref 70–99)

## 2018-12-06 LAB — BASIC METABOLIC PANEL
Anion gap: 7 (ref 5–15)
BUN: 6 mg/dL (ref 6–20)
CO2: 24 mmol/L (ref 22–32)
Calcium: 6.6 mg/dL — ABNORMAL LOW (ref 8.9–10.3)
Chloride: 108 mmol/L (ref 98–111)
Creatinine, Ser: 0.59 mg/dL (ref 0.44–1.00)
GFR calc Af Amer: >60 mL/min (ref >60)
GFR calc non Af Amer: >60 mL/min (ref >60)
Glucose, Bld: 235 mg/dL — ABNORMAL HIGH (ref 70–99)
Potassium: 3.7 mmol/L (ref 3.5–5.1)
Sodium: 139 mmol/L (ref 135–145)

## 2018-12-06 LAB — CBC
HCT: 23.1 % — ABNORMAL LOW (ref 36.0–46.0)
Hemoglobin: 7.3 g/dL — ABNORMAL LOW (ref 12.0–15.0)
MCH: 30.3 pg (ref 26.0–34.0)
MCHC: 31.6 g/dL (ref 30.0–36.0)
MCV: 95.9 fL (ref 80.0–100.0)
Platelets: 412 10*3/uL — ABNORMAL HIGH (ref 150–400)
RBC: 2.41 MIL/uL — ABNORMAL LOW (ref 3.87–5.11)
RDW: 16.9 % — ABNORMAL HIGH (ref 11.5–15.5)
WBC: 15.8 10*3/uL — ABNORMAL HIGH (ref 4.0–10.5)
nRBC: 0.2 % (ref 0.0–0.2)

## 2018-12-06 LAB — MAGNESIUM: Magnesium: 1.6 mg/dL — ABNORMAL LOW (ref 1.7–2.4)

## 2018-12-06 MED ORDER — CHLORHEXIDINE GLUCONATE 4 % EX LIQD
60.0000 mL | Freq: Once | CUTANEOUS | Status: AC
  Administered 2018-12-07: 4 via TOPICAL
  Filled 2018-12-06: qty 60

## 2018-12-06 MED ORDER — POVIDONE-IODINE 10 % EX SWAB: 2.0000 "application " | Freq: Once | CUTANEOUS | Status: DC

## 2018-12-06 MED ORDER — CEFAZOLIN SODIUM-DEXTROSE 2-4 GM/100ML-% IV SOLN
2.0000 g | INTRAVENOUS | Status: AC
  Administered 2018-12-07: 2 g via INTRAVENOUS
  Filled 2018-12-06: qty 100

## 2018-12-06 MED ORDER — INSULIN GLARGINE 100 UNIT/ML ~~LOC~~ SOLN
25.0000 [IU] | Freq: Two times a day (BID) | SUBCUTANEOUS | Status: DC
  Administered 2018-12-06 – 2018-12-08 (×4): 25 [IU] via SUBCUTANEOUS
  Filled 2018-12-06 (×5): qty 0.25

## 2018-12-06 NOTE — Progress Notes (Signed)
Patient ID: Yolanda [name], female   DOB: 08-03-1975, 43 y.o.   MRN: [id number] I reviewed patient's MRI scan of her left wrist.  This does show a fluid collection in the volar aspect radial left wrist.  Will plan for irrigation and debridement of the left wrist tomorrow.  This does not seem to extend into the hand or into the carpal tunnel.

## 2018-12-06 NOTE — Progress Notes (Signed)
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  On 5/20, patient presented at [facility] with complaint of worsening pain and swelling of her right foot for 1 week.  CT scanning was obtained which was concerning for destruction of the entire mid and hindfoot with fluid and gas collections, surrounding cellulitis. She was transferred to [facility] as a direct admit.  On presentation, patient was septic with a heart rate 80s, blood pressure in 80s and lactate elevated to 5.7. Infectious disease and orthopedic consult were obtained.  Assessment & Plan:   Principal Problem:   MRSA bacteremia Active Problems:   Diabetes mellitus type 2, uncontrolled, with complications ([place])   Charcot foot due to diabetes mellitus ([place])   Severe protein-calorie malnutrition ([place])   Diabetic polyneuropathy associated with type 2 diabetes mellitus ([place])   Cigarette smoker   Subacute osteomyelitis, right ankle and foot ([place])   Cutaneous abscess of right foot   Abscess of bursa, left wrist   Sepsis, present on admission MRSA septicemia Diabetic right foot infection, osteomyelitis Patient presenting as a transfer from [facility] rocking him with worsening pain and swelling over right foot x1 week.  CT foot notable for destruction of the entire midfoot and hindfoot with fluid and gas collections and surrounding cellulitis.  ESR 136, CRP 22.1.  Lactic acid 1.4.  Blood Cultures from 020 2 out of 2 positive for MRSA. --Orthopedics, Dr. 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 --Infectious disease following, appreciate assistance --TEE on 5/22 with no indication of endocarditis --repeat blood cultures x 2 on 5/22 negative --ID recommends to complete 4 weeks of IV vancomycin, through 12/25/2018 --PICC line placed --Goal vancomycin trough 15-20; pharmacy for monitoring/dosing  Left wrist fluid collection MRI notable for heterogeneous focal abnormality of the volar subcutaneous fat measuring 2.6 x 1.3 x 3 cm concerning for phlegmon versus developing abscess. --Plan for debridement and irrigation by orthopedics on 12/07/2018  Type 2 diabetes mellitus Hemoglobin A1c 7.9.  Home regimen includes Humalog 75/25 30u BID and NovoLog 3 units 3 times daily AC. --Continue Lantus 20 units [place] BID --Novolog 6u TIDAC --Moderate dose insulin sliding scale for further coverage --continue CBG's qAC/HS  Essential hypertension BP stable  Right lower extremity DVT Diagnosed in 07/2018, used to be on Xarelto, she completed the starter pack however Xarelto was discontinued due to bleeding issues at [facility] in 09/2018. Right lower extremity Dopplers on 3/29 did not show any DVT, so Xarelto was not resumed.  Hypercalcemia: resolved Likely due to sepsis, dehydration, calcium 10.5 with a time of admission  Severe protein calorie malnutrition Albumin 1.2 --Placed on pro-stat, nutritional supplements  Acute on chronic anemia  --H&H currently stable baseline 9-10 --Hemoglobin 7.4 at OSH, transfused 1 unit packed RBC,  --H&H 7.7 this morning. --Continue to monitor hemoglobin and transfuse for less than 7.     DVT prophylaxis: Heparin Code Status: Full code Family Communication: none Disposition Plan: Pending SNF  placement.   Consultants:   Orthopedics,  Dr. Sharol Given  Infectious disease, Dr. Megan Salon  Procedures:   Transtibial amputation right lower extremity 11/30/2018  PICC line placement, RUE 5/26  Antimicrobials:   Vancomycin 11/28/2018>>>   Subjective: Patient seen and examined at bedside, resting comfortably in bed.  No other complaints this morning.  Our left wrist shows fluid collection, and orthopedics plans on debridement and irrigation tomorrow.  Denies headache, no fever/chills/night sweats, no nausea/vomiting/diarrhea, no chest pain, no palpitations, no abdominal pain.  No acute events overnight per nursing staff.  Objective: Vitals:   12/06/18 0700 12/06/18 0845 12/06/18 0920 12/06/18 1300  BP:   (!) 159/105   Pulse:   100   Resp:   19   Temp:  98.1 F (36.7 C) 97.9 F (36.6 C) 98 F (36.7 C)  TempSrc:  Oral Oral Oral  SpO2:   100%   Weight: 83.1 kg     Height:        Intake/Output Summary (Last 24 hours) at 12/06/2018 1414 Last data filed at 12/06/2018 1354 Gross per 24 hour  Intake 1278.74 ml  Output 1500 ml  Net -221.26 ml   Filed Weights   12/03/18 0641 12/04/18 0657 12/06/18 0700  Weight: 80 kg 77.6 kg 83.1 kg    Examination:  General exam: Appears calm and comfortable  Respiratory system: Clear to auscultation. Respiratory effort normal. Cardiovascular system: S1 & S2 heard, RRR. No JVD, murmurs, rubs, gallops or clicks. No pedal edema. Gastrointestinal system: Abdomen is nondistended, soft and nontender. No organomegaly or masses felt. Normal bowel sounds heard. Central nervous system: Alert and oriented. No focal neurological deficits. Extremities: Edema noted to left wrist, no erythema or fluctuance, dressing in place to right lower extremity Skin: No rashes, lesions or ulcers Psychiatry: Judgement and insight appear normal. Mood & affect appropriate.     Data Reviewed: I have personally reviewed following labs and imaging studies  CBC: Recent Labs  Lab 11/30/18 0319 12/01/18 0301  12/02/18 0258 12/04/18 0259 12/06/18 0402  WBC 21.8* 25.3* 21.0* 22.0* 15.8*  HGB 8.2* 7.6* 7.6* 7.7* 7.3*  HCT 24.2* 22.8* 22.9* 23.7* 23.1*  MCV 90.3 90.8 91.6 93.3 95.9  PLT 356 334 346 366 552*   Basic Metabolic Panel: Recent Labs  Lab 11/30/18 0319 12/01/18 0301 12/02/18 0258 12/04/18 0259 12/06/18 0402  NA 137 133* 137 139 139  K 3.4* 3.8 3.5 3.4* 3.7  CL 107 107 108 110 108  CO2 23 21* '24 24 24  ' GLUCOSE 93 341* 119* 78 235*  BUN '12 9 12 9 6  ' CREATININE 0.91 0.76 0.65 0.51 0.59  CALCIUM 9.2 8.3* 7.9* 7.3* 6.6*  MG  --   --   --   --  1.6*   GFR: Estimated Creatinine Clearance: 99.5 mL/min (by C-G formula based on SCr of 0.59 mg/dL). Liver Function Tests: No results for input(s): AST, ALT, ALKPHOS, BILITOT, PROT, ALBUMIN in the last 168 hours. No results for input(s): LIPASE, AMYLASE in the last 168 hours. No results for input(s): AMMONIA in the last 168 hours. Coagulation Profile: No results for input(s): INR, PROTIME in the last 168 hours. Cardiac Enzymes: No results for input(s): CKTOTAL, CKMB, CKMBINDEX, TROPONINI in the last 168 hours. BNP (last 3 results) No results for input(s): PROBNP in the last 8760 hours. HbA1C: No results for input(s): HGBA1C in the last 72 hours. CBG: Recent Labs  Lab 12/05/18 1225 12/05/18 1755 12/05/18 2209 12/06/18 0802 12/06/18 1220  GLUCAP  115* 204* 119* 192* 230*   Lipid Profile: No results for input(s): CHOL, HDL, LDLCALC, TRIG, CHOLHDL, LDLDIRECT in the last 72 hours. Thyroid Function Tests: No results for input(s): TSH, T4TOTAL, FREET4, T3FREE, THYROIDAB in the last 72 hours. Anemia Panel: No results for input(s): VITAMINB12, FOLATE, FERRITIN, TIBC, IRON, RETICCTPCT in the last 72 hours. Sepsis Labs: No results for input(s): PROCALCITON, LATICACIDVEN in the last 168 hours.  Recent Results (from the past 240 hour(s))  MRSA PCR Screening     Status: Abnormal   Collection Time: 11/28/18  5:30 PM  Result Value Ref  Range Status   MRSA by PCR POSITIVE (A) NEGATIVE Final    Comment:        The GeneXpert MRSA Assay (FDA approved for NASAL specimens only), is one component of a comprehensive MRSA colonization surveillance program. It is not intended to diagnose MRSA infection nor to guide or monitor treatment for MRSA infections. RESULT CALLED TO, READ BACK BY AND VERIFIED WITHJefferson Fuel RN 11/28/18 1928 JDW Performed at [facility], [address], [place], [place] [address]   SARS Coronavirus 2 [facility] order, Performed in [facility] hospital lab)     Status: None   Collection Time: 11/28/18  6:29 PM  Result Value Ref Range Status   SARS Coronavirus 2 NEGATIVE NEGATIVE Final    Comment: (NOTE) If result is NEGATIVE SARS-CoV-2 target nucleic acids are NOT DETECTED. The SARS-CoV-2 RNA is generally detectable in upper and lower  respiratory specimens during the acute phase of infection. The lowest  concentration of SARS-CoV-2 viral copies this assay can detect is 250  copies / mL. A negative result does not preclude SARS-CoV-2 infection  and should not be used as the sole basis for treatment or other  patient management decisions.  A negative result may occur with  improper specimen collection / handling, submission of specimen other  than nasopharyngeal swab, presence of viral mutation(s) within the  areas targeted by this assay, and inadequate number of viral copies  (<250 copies / mL). A negative result must be combined with clinical  observations, patient history, and epidemiological information. If result is POSITIVE SARS-CoV-2 target nucleic acids are DETECTED. The SARS-CoV-2 RNA is generally detectable in upper and lower  respiratory specimens dur ing the acute phase of infection.  Positive  results are indicative of active infection with SARS-CoV-2.  Clinical  correlation with patient history and other diagnostic information is  necessary to determine patient infection  status.  Positive results do  not rule out bacterial infection or co-infection with other viruses. If result is PRESUMPTIVE POSTIVE SARS-CoV-2 nucleic acids MAY BE PRESENT.   A presumptive positive result was obtained on the submitted specimen  and confirmed on repeat testing.  While 2019 novel coronavirus  (SARS-CoV-2) nucleic acids may be present in the submitted sample  additional confirmatory testing may be necessary for epidemiological  and / or clinical management purposes  to differentiate between  SARS-CoV-2 and other Sarbecovirus currently known to infect humans.  If clinically indicated additional testing with an alternate test  methodology [phone]) is advised. The SARS-CoV-2 RNA is generally  detectable in upper and lower respiratory sp ecimens during the acute  phase of infection. The expected result is Negative. Fact Sheet for Patients:  StrictlyIdeas.no Fact Sheet for Healthcare Providers: BankingDealers.co.za This test is not yet approved or cleared by the [place] FDA and has been authorized for detection and/or diagnosis of SARS-CoV-2 by FDA under an Emergency  Use Authorization (EUA).  This EUA will remain in effect (meaning this test can be used) for the duration of the COVID-19 declaration under Section 564(b)(1) of the Act, 21 U.S.C. section 360bbb-3(b)(1), unless the authorization is terminated or revoked sooner. Performed at [facility], [place] [address] [place], [place] [address]   Culture, blood (routine x 2)     Status: Abnormal   Collection Time: 11/28/18  6:29 PM  Result Value Ref Range Status   Specimen Description BLOOD RIGHT ARM  Final   Special Requests   Final    BOTTLES DRAWN AEROBIC AND ANAEROBIC Blood Culture adequate volume   Culture  Setup Time   Final    GRAM POSITIVE COCCI IN BOTH AEROBIC AND ANAEROBIC BOTTLES CRITICAL RESULT CALLED TO, READ BACK BY AND VERIFIED WITHBronwen Betters [facility]  2637 11/29/18 A BROWNING Performed at [facility], [place] [address], [place], [place] [address]    Culture METHICILLIN RESISTANT STAPHYLOCOCCUS AUREUS (A)  Final   Report Status 12/01/2018 FINAL  Final   Organism ID, Bacteria METHICILLIN RESISTANT STAPHYLOCOCCUS AUREUS  Final      Susceptibility   Methicillin resistant staphylococcus aureus - MIC*    CIPROFLOXACIN >=8 RESISTANT Resistant     ERYTHROMYCIN >=8 RESISTANT Resistant     GENTAMICIN <=0.5 SENSITIVE Sensitive     OXACILLIN >=4 RESISTANT Resistant     TETRACYCLINE <=1 SENSITIVE Sensitive     VANCOMYCIN <=0.5 SENSITIVE Sensitive     TRIMETH/SULFA <=10 SENSITIVE Sensitive     CLINDAMYCIN >=8 RESISTANT Resistant     RIFAMPIN <=0.5 SENSITIVE Sensitive     Inducible Clindamycin NEGATIVE Sensitive     * METHICILLIN RESISTANT STAPHYLOCOCCUS AUREUS  Blood Culture ID Panel (Reflexed)     Status: Abnormal   Collection Time: 11/28/18  6:29 PM  Result Value Ref Range Status   Enterococcus species NOT DETECTED NOT DETECTED Final   Listeria monocytogenes NOT DETECTED NOT DETECTED Final   Staphylococcus species DETECTED (A) NOT DETECTED Final    Comment: CRITICAL RESULT CALLED TO, READ BACK BY AND VERIFIED WITH: Bronwen Betters PHARMD 1612 11/29/18 A BROWNING    Staphylococcus aureus (BCID) DETECTED (A) NOT DETECTED Final    Comment: Methicillin (oxacillin)-resistant Staphylococcus aureus (MRSA). MRSA is predictably resistant to beta-lactam antibiotics (except ceftaroline). Preferred therapy is vancomycin unless clinically contraindicated. Patient requires contact precautions if  hospitalized. CRITICAL RESULT CALLED TO, READ BACK BY AND VERIFIED WITH: L CURRAN PHARMD 1612 11/29/18 A BROWNING    Methicillin resistance DETECTED (A) NOT DETECTED Final    Comment: CRITICAL RESULT CALLED TO, READ BACK BY AND VERIFIED WITH: L CURRAN PHARMD 1612 11/29/18 A BROWNING    Streptococcus species NOT DETECTED NOT DETECTED Final   Streptococcus agalactiae  NOT DETECTED NOT DETECTED Final   Streptococcus pneumoniae NOT DETECTED NOT DETECTED Final   Streptococcus pyogenes NOT DETECTED NOT DETECTED Final   Acinetobacter baumannii NOT DETECTED NOT DETECTED Final   Enterobacteriaceae species NOT DETECTED NOT DETECTED Final   Enterobacter cloacae complex NOT DETECTED NOT DETECTED Final   Escherichia coli NOT DETECTED NOT DETECTED Final   Klebsiella oxytoca NOT DETECTED NOT DETECTED Final   Klebsiella pneumoniae NOT DETECTED NOT DETECTED Final   Proteus species NOT DETECTED NOT DETECTED Final   Serratia marcescens NOT DETECTED NOT DETECTED Final   Haemophilus influenzae NOT DETECTED NOT DETECTED Final   Neisseria meningitidis NOT DETECTED NOT DETECTED Final   Pseudomonas aeruginosa NOT DETECTED NOT DETECTED Final   Candida albicans  NOT DETECTED NOT DETECTED Final   Candida glabrata NOT DETECTED NOT DETECTED Final   Candida krusei NOT DETECTED NOT DETECTED Final   Candida parapsilosis NOT DETECTED NOT DETECTED Final   Candida tropicalis NOT DETECTED NOT DETECTED Final    Comment: Performed at [facility], [place] [address], [place], [place] [address]  Culture, blood (routine x 2)     Status: Abnormal   Collection Time: 11/28/18  7:29 PM  Result Value Ref Range Status   Specimen Description BLOOD LEFT ARM  Final   Special Requests   Final    BOTTLES DRAWN AEROBIC AND ANAEROBIC Blood Culture adequate volume   Culture  Setup Time   Final    GRAM POSITIVE COCCI IN BOTH AEROBIC AND ANAEROBIC BOTTLES CRITICAL VALUE NOTED.  VALUE IS CONSISTENT WITH PREVIOUSLY REPORTED AND CALLED VALUE.    Culture (A)  Final    STAPHYLOCOCCUS AUREUS SUSCEPTIBILITIES PERFORMED ON PREVIOUS CULTURE WITHIN THE LAST 5 DAYS. Performed at [facility], [place] [address], [place], [place] [address]    Report Status 12/01/2018 FINAL  Final  Culture, Urine     Status: None   Collection Time: 11/29/18 12:58 PM  Result Value Ref Range Status   Specimen Description  URINE, CATHETERIZED  Final   Special Requests NONE  Final   Culture   Final    NO GROWTH Performed at [facility], [place] [address], [place], [place] [address]    Report Status 11/30/2018 FINAL  Final  Culture, blood (routine x 2)     Status: None   Collection Time: 11/30/18  3:22 PM  Result Value Ref Range Status   Specimen Description BLOOD LEFT ANTECUBITAL  Final   Special Requests   Final    BOTTLES DRAWN AEROBIC ONLY Blood Culture adequate volume   Culture   Final    NO GROWTH 5 DAYS Performed at [facility], [place] [address] [place], [place] [address]    Report Status 12/05/2018 FINAL  Final  Culture, blood (routine x 2)     Status: None   Collection Time: 11/30/18  3:22 PM  Result Value Ref Range Status   Specimen Description BLOOD RIGHT HAND  Final   Special Requests   Final    BOTTLES DRAWN AEROBIC ONLY Blood Culture adequate volume   Culture   Final    NO GROWTH 5 DAYS Performed at [facility], [place] [address] [place], [place] [address]    Report Status 12/05/2018 FINAL  Final         Radiology Studies: Dg Wrist 2 Views Left  Result Date: 12/04/2018 CLINICAL DATA:  43 year old female with history of swelling in the left wrist. EXAM: LEFT WRIST - 2 VIEW COMPARISON:  No priors. FINDINGS: There is no evidence of fracture or dislocation. There is no evidence of arthropathy or other focal bone abnormality. Diffuse soft tissue swelling around the wrist. IMPRESSION: 1. Severe soft tissue swelling around the left wrist with no underlying displaced fracture, subluxation or dislocation. Electronically Signed   By: Vinnie Langton M.D.   On: 12/04/2018 18:09   Mr Wrist Left Wo Contrast  Result Date: 12/05/2018 CLINICAL DATA:  Chronic left wrist swelling. Patient's radiographs shows no destructive bony changes, subluxation, or fractures. White cell count 22,000 uric acid within normal range. I EXAM: MR OF THE LEFT WRIST WITHOUT CONTRAST TECHNIQUE:  Multiplanar, multisequence MR imaging of the left wrist was performed. No intravenous contrast was  administered. COMPARISON:  None. FINDINGS: Ligaments: Intact scapholunate and lunotriquetral ligaments. Triangular fibrocartilage: Intact TFCC. Tendons: Intact flexor and extensor compartment tendons. Carpal tunnel/median nerve: Normal carpal tunnel. Normal median nerve. Guyon's canal: Normal. Joint/cartilage: No joint effusion. No synovitis. No intra-articular loose body. Small amount of joint fluid in the pisotriquetral joint. No chondral defect. Bones/carpal alignment: No acute osseous abnormality. No aggressive osseous lesion. No periosteal reaction or bone destruction. Normal alignment. Other: Soft tissue edema circumferentially around the wrist most concerning for cellulitis. Heterogeneous T2 hyperintense and T1 hypointense area in the volar subcutaneous fat measuring approximately 2.6 x 1.3 x 3 cm which may reflect phlegmonous changes versus a developing abscess. Muscle edema in the opponens pollicis muscle, adductor digiti minimi, and flexor digiti minimi muscles concerning for myositis secondary to an infectious or inflammatory etiology. IMPRESSION: 1. Cellulitis of left wrist. 2. Heterogeneous focal abnormality in the volar subcutaneous fat measuring 2.6 x 1.3 x 3 cm which may reflect a phlegmon versus developing abscess. If there is further clinical concern recommend MRI of the left wrist with contrast. 3. Muscle edema in the opponens pollicis muscle, adductor digiti minimi, and flexor digiti minimi muscles concerning for myositis secondary to an infectious or inflammatory etiology. 4. No evidence of septic arthritis of the left wrist. Electronically Signed   By: Kathreen Devoid   On: 12/05/2018 21:39        Scheduled Meds:  colchicine  0.6 mg Oral BID   docusate sodium  100 mg Oral BID   heparin injection (subcutaneous)  5,000 Units Subcutaneous Q8H   insulin aspart  0-15 Units Subcutaneous TID  WC   insulin aspart  0-5 Units Subcutaneous QHS   insulin aspart  6 Units Subcutaneous TID WC   insulin glargine  20 Units Subcutaneous BID   multivitamin with minerals  1 tablet Oral Daily   Ensure Max Protein  11 oz Oral Daily   Continuous Infusions:  sodium chloride 250 mL (11/30/18 0650)   sodium chloride 10 mL/hr at 12/04/18 2231   lactated ringers Stopped (11/30/18 1330)   methocarbamol (ROBAXIN) IV     vancomycin 1,000 mg (12/06/18 0919)     LOS: 8 days    Time spent: 39 minutes    Evander Macaraeg J [place] DO [facility] Pager [phone]  If 7PM-7AM, please contact night-coverage [email or web address] Password [facility] 12/06/2018, 2:14 PM

## 2018-12-06 NOTE — Progress Notes (Signed)
Occupational Therapy Treatment Patient Details Name: Yolanda [name] MRN: [id number] DOB: 1976-07-08 Today's Date: 12/06/2018    History of present illness Pt is a 43 y.o. F with significant PMH of diabetes type 2, diabetic foot ulcer, who presents with sepsis with right foot infection, osteomyelitis and MRSA bacteremia and presumed endocarditis. 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 Pt now with L hand swelling and pain and awaiting MRI on hand to see if any infectious process. X ray was negative for fracture.    OT comments  Pt performing LB ADL with totalA today for pericare. Pt having multiple BMs and pt upset that she hasn't been able to stop. RN made aware. Pt rolling to R side with minA overall and bed pan placed after pericare as pt "having to go again." Pt performing L wrist A/AROM in all planes with cues for proper technique. Pt would benefit from continued OT skilled services for ADL, mobility and safety in SNF. OT to follow acutely.      Follow Up Recommendations  SNF;Supervision/Assistance - 24 hour    Equipment Recommendations  3 in 1 bedside commode    Recommendations for Other Services      Precautions / Restrictions Precautions Precautions: Fall Precaution Comments: R BKA, wound vac Restrictions Weight Bearing Restrictions: Yes RLE Weight Bearing: Non weight bearing       Mobility Bed Mobility Overal bed mobility: Needs Assistance   Rolling: Min assist         General bed mobility comments: Limited by L hand/wrist pain.  Transfers Overall transfer level: Needs assistance                    Balance                                           ADL either performed or assessed with clinical judgement   ADL Overall ADL's : Needs assistance/impaired                             Toileting- Clothing Manipulation and Hygiene: Total assistance;Bed level Toileting - Clothing Manipulation Details (indicate cue type and  reason): Pt having multiple BMs this PM and unable to get on bed pan in time earlier. OT assisted with BM and pt able to get on bed pan in time. Pt totalA for pericare/       General ADL Comments: pt limited by decreased strength, balance, coordination and cognition     Vision   Vision Assessment?: No apparent visual deficits   Perception     Praxis      Cognition Arousal/Alertness: Awake/alert Behavior During Therapy: WFL for tasks assessed/performed Overall Cognitive Status: Impaired/Different from baseline                               Problem Solving: Requires verbal cues;Difficulty sequencing General Comments: pt with decreased awareness of deficits         Exercises     Shoulder Instructions       General Comments Pt given L wrist exercises, 10 reps, 2 sets wrist flex/ext, hand ROM.    Pertinent Vitals/ Pain       Pain Assessment: 0-10 Pain Score: 4  Pain Location: L hand  Pain Descriptors / Indicators: Discomfort Pain  Intervention(s): Limited activity within patient's tolerance  Home Living                                          Prior Functioning/Environment              Frequency  Min 2X/week        Progress Toward Goals  OT Goals(current goals can now be found in the care plan section)  Progress towards OT goals: Progressing toward goals  Acute Rehab OT Goals Patient Stated Goal: "for my hand to stop hurting"  OT Goal Formulation: With patient Time For Goal Achievement: 12/16/18 Potential to Achieve Goals: Good ADL Goals Pt Will Perform Lower Body Bathing: with min assist;sitting/lateral leans;with adaptive equipment Pt Will Perform Lower Body Dressing: with min assist;sitting/lateral leans;sit to/from stand;with adaptive equipment Pt Will Transfer to Toilet: with min assist;bedside commode;squat pivot transfer Pt Will Perform Toileting - Clothing Manipulation and hygiene: with min assist;sitting/lateral  leans;sit to/from stand;with adaptive equipment Pt/caregiver will Perform Home Exercise Program: Increased strength;Increased ROM;Both right and left upper extremity;With written HEP provided  Plan Discharge plan remains appropriate;Frequency remains appropriate    Co-evaluation                 AM-PAC OT "6 Clicks" Daily Activity     Outcome Measure   Help from another person eating meals?: None Help from another person taking care of personal grooming?: None Help from another person toileting, which includes using toliet, bedpan, or urinal?: A Lot Help from another person bathing (including washing, rinsing, drying)?: A Lot Help from another person to put on and taking off regular upper body clothing?: A Little Help from another person to put on and taking off regular lower body clothing?: A Lot 6 Click Score: 17    End of Session    OT Visit Diagnosis: Unsteadiness on feet (R26.81);Muscle weakness (generalized) (M62.81)   Activity Tolerance Treatment limited secondary to medical complications (Comment);Patient tolerated treatment well   Patient Left in bed;with call bell/phone within reach;with bed alarm set   Nurse Communication Mobility status        Time: 3220-2542 OT Time Calculation (min): 20 min  Charges: OT General Charges $OT Visit: 1 Visit OT Treatments $Self Care/Home Management : 8-22 mins  Yolanda [name]) Yolanda [name] OTR/L Acute Rehabilitation Services Pager: [phone] Office: [phone]    Yolanda [name] 12/06/2018, 4:41 PM

## 2018-12-07 ENCOUNTER — Encounter (HOSPITAL_COMMUNITY): Payer: Self-pay | Admitting: Certified Registered Nurse Anesthetist

## 2018-12-07 ENCOUNTER — Inpatient Hospital Stay (HOSPITAL_COMMUNITY): Payer: Medicaid Other | Admitting: Anesthesiology

## 2018-12-07 ENCOUNTER — Encounter (HOSPITAL_COMMUNITY)
Admission: AD | Disposition: A | Discharge status: Home Health | Payer: Self-pay | Source: Other Acute Inpatient Hospital | Attending: Internal Medicine

## 2018-12-07 DIAGNOSIS — L02512 Cutaneous abscess of left hand: Secondary | ICD-10-CM

## 2018-12-07 HISTORY — PX: I & D EXTREMITY: SHX5045

## 2018-12-07 LAB — BASIC METABOLIC PANEL
Anion gap: 5 (ref 5–15)
BUN: 8 mg/dL (ref 6–20)
CO2: 24 mmol/L (ref 22–32)
Calcium: 6.9 mg/dL — ABNORMAL LOW (ref 8.9–10.3)
Chloride: 110 mmol/L (ref 98–111)
Creatinine, Ser: 0.41 mg/dL — ABNORMAL LOW (ref 0.44–1.00)
GFR calc Af Amer: >60 mL/min (ref >60)
GFR calc non Af Amer: >60 mL/min (ref >60)
Glucose, Bld: 150 mg/dL — ABNORMAL HIGH (ref 70–99)
Potassium: 3.5 mmol/L (ref 3.5–5.1)
Sodium: 139 mmol/L (ref 135–145)

## 2018-12-07 LAB — GLUCOSE, CAPILLARY
Glucose-Capillary: 109 mg/dL — ABNORMAL HIGH (ref 70–99)
Glucose-Capillary: 76 mg/dL (ref 70–99)
Glucose-Capillary: 86 mg/dL (ref 70–99)
Glucose-Capillary: 385 mg/dL — ABNORMAL HIGH (ref 70–99)
Glucose-Capillary: 297 mg/dL — ABNORMAL HIGH (ref 70–99)

## 2018-12-07 LAB — CBC
HCT: 22.3 % — ABNORMAL LOW (ref 36.0–46.0)
Hemoglobin: 7 g/dL — ABNORMAL LOW (ref 12.0–15.0)
MCH: 31 pg (ref 26.0–34.0)
MCHC: 31.4 g/dL (ref 30.0–36.0)
MCV: 98.7 fL (ref 80.0–100.0)
Platelets: 377 10*3/uL (ref 150–400)
RBC: 2.26 MIL/uL — ABNORMAL LOW (ref 3.87–5.11)
RDW: 17.5 % — ABNORMAL HIGH (ref 11.5–15.5)
WBC: 13.6 10*3/uL — ABNORMAL HIGH (ref 4.0–10.5)
nRBC: 0.1 % (ref 0.0–0.2)

## 2018-12-07 LAB — POCT PREGNANCY, URINE: Preg Test, Ur: NEGATIVE

## 2018-12-07 LAB — MAGNESIUM: Magnesium: 1.5 mg/dL — ABNORMAL LOW (ref 1.7–2.4)

## 2018-12-07 SURGERY — IRRIGATION AND DEBRIDEMENT WOUND
Anesthesia: General | Laterality: Left

## 2018-12-07 SURGERY — IRRIGATION AND DEBRIDEMENT EXTREMITY
Anesthesia: General | Site: Wrist | Laterality: Left

## 2018-12-07 MED ORDER — PHENYLEPHRINE 40 MCG/ML (10ML) SYRINGE FOR IV PUSH (FOR BLOOD PRESSURE SUPPORT)
PREFILLED_SYRINGE | INTRAVENOUS | Status: AC
  Filled 2018-12-07: qty 10

## 2018-12-07 MED ORDER — DEXAMETHASONE SODIUM PHOSPHATE 10 MG/ML IJ SOLN
INTRAMUSCULAR | Status: AC
  Filled 2018-12-07: qty 3

## 2018-12-07 MED ORDER — PROPOFOL 10 MG/ML IV BOLUS
INTRAVENOUS | Status: DC | PRN
  Administered 2018-12-07: 150 mg via INTRAVENOUS

## 2018-12-07 MED ORDER — DEXAMETHASONE SODIUM PHOSPHATE 10 MG/ML IJ SOLN
INTRAMUSCULAR | Status: DC | PRN
  Administered 2018-12-07: 5 mg via INTRAVENOUS

## 2018-12-07 MED ORDER — LACTATED RINGERS IV SOLN
INTRAVENOUS | Status: DC
  Administered 2018-12-07 – 2018-12-08 (×2): via INTRAVENOUS

## 2018-12-07 MED ORDER — ONDANSETRON HCL 4 MG/2ML IJ SOLN
INTRAMUSCULAR | Status: AC
  Filled 2018-12-07: qty 2

## 2018-12-07 MED ORDER — 0.9 % SODIUM CHLORIDE (POUR BTL) OPTIME
TOPICAL | Status: DC | PRN
  Administered 2018-12-07: 13:00:00 1000 mL

## 2018-12-07 MED ORDER — LIDOCAINE HCL (CARDIAC) PF 100 MG/5ML IV SOSY
PREFILLED_SYRINGE | INTRAVENOUS | Status: DC | PRN
  Administered 2018-12-07: 100 mg via INTRAVENOUS

## 2018-12-07 MED ORDER — MIDAZOLAM HCL 2 MG/2ML IJ SOLN
INTRAMUSCULAR | Status: AC
  Filled 2018-12-07: qty 2

## 2018-12-07 MED ORDER — LIDOCAINE 2% (20 MG/ML) 5 ML SYRINGE
INTRAMUSCULAR | Status: AC
  Filled 2018-12-07: qty 10

## 2018-12-07 MED ORDER — SUCCINYLCHOLINE CHLORIDE 20 MG/ML IJ SOLN
INTRAMUSCULAR | Status: DC | PRN
  Administered 2018-12-07: 120 mg via INTRAVENOUS

## 2018-12-07 MED ORDER — PHENYLEPHRINE HCL (PRESSORS) 10 MG/ML IV SOLN
INTRAVENOUS | Status: DC | PRN
  Administered 2018-12-07 (×2): 80 ug via INTRAVENOUS

## 2018-12-07 MED ORDER — FENTANYL CITRATE (PF) 100 MCG/2ML IJ SOLN
INTRAMUSCULAR | Status: DC | PRN
  Administered 2018-12-07: 100 ug via INTRAVENOUS
  Administered 2018-12-07: 50 ug via INTRAVENOUS

## 2018-12-07 MED ORDER — FENTANYL CITRATE (PF) 250 MCG/5ML IJ SOLN
INTRAMUSCULAR | Status: AC
  Filled 2018-12-07: qty 5

## 2018-12-07 MED ORDER — PROPOFOL 10 MG/ML IV BOLUS
INTRAVENOUS | Status: AC
  Filled 2018-12-07: qty 20

## 2018-12-07 MED ORDER — SODIUM CHLORIDE 0.9 % IR SOLN
Status: DC | PRN
  Administered 2018-12-07: 3000 mL

## 2018-12-07 MED ORDER — ONDANSETRON HCL 4 MG/2ML IJ SOLN
INTRAMUSCULAR | Status: DC | PRN
  Administered 2018-12-07: 4 mg via INTRAVENOUS

## 2018-12-07 MED ORDER — MAGNESIUM SULFATE 2 GM/50ML IV SOLN
2.0000 g | Freq: Once | INTRAVENOUS | Status: AC
  Administered 2018-12-07: 2 g via INTRAVENOUS
  Filled 2018-12-07: qty 50

## 2018-12-07 MED ORDER — ROCURONIUM BROMIDE 10 MG/ML (PF) SYRINGE
PREFILLED_SYRINGE | INTRAVENOUS | Status: AC
  Filled 2018-12-07: qty 10

## 2018-12-07 SURGICAL SUPPLY — 36 items
BLADE SURG 21 STRL SS (BLADE) ×3 IMPLANT
BNDG COHESIVE 3X5 TAN STRL LF (GAUZE/BANDAGES/DRESSINGS) ×2 IMPLANT
BNDG COHESIVE 6X5 TAN STRL LF (GAUZE/BANDAGES/DRESSINGS) IMPLANT
BNDG ESMARK 4X9 LF (GAUZE/BANDAGES/DRESSINGS) ×2 IMPLANT
BNDG GAUZE ELAST 4 BULKY (GAUZE/BANDAGES/DRESSINGS) ×4 IMPLANT
CONT SPEC 4OZ CLIKSEAL STRL BL (MISCELLANEOUS) ×2 IMPLANT
COVER SURGICAL LIGHT HANDLE (MISCELLANEOUS) ×4 IMPLANT
COVER WAND RF STERILE (DRAPES) ×1 IMPLANT
DRAPE U-SHAPE 47X51 STRL (DRAPES) ×3 IMPLANT
DRSG ADAPTIC 3X8 NADH LF (GAUZE/BANDAGES/DRESSINGS) ×3 IMPLANT
DURAPREP 26ML APPLICATOR (WOUND CARE) ×3 IMPLANT
ELECT REM PT RETURN 9FT ADLT (ELECTROSURGICAL)
ELECTRODE REM PT RTRN 9FT ADLT (ELECTROSURGICAL) IMPLANT
GAUZE SPONGE 4X4 12PLY STRL (GAUZE/BANDAGES/DRESSINGS) ×3 IMPLANT
GLOVE BIOGEL PI IND STRL 9 (GLOVE) ×1 IMPLANT
GLOVE BIOGEL PI INDICATOR 9 (GLOVE) ×2
GLOVE SURG ORTHO 9.0 STRL STRW (GLOVE) ×3 IMPLANT
GOWN STRL REUS W/ TWL XL LVL3 (GOWN DISPOSABLE) ×2 IMPLANT
GOWN STRL REUS W/TWL XL LVL3 (GOWN DISPOSABLE) ×4
HANDPIECE INTERPULSE COAX TIP (DISPOSABLE) ×2
KIT BASIN OR (CUSTOM PROCEDURE TRAY) ×3 IMPLANT
KIT TURNOVER KIT B (KITS) ×3 IMPLANT
MANIFOLD NEPTUNE II (INSTRUMENTS) ×3 IMPLANT
NS IRRIG 1000ML POUR BTL (IV SOLUTION) ×3 IMPLANT
PACK ORTHO EXTREMITY (CUSTOM PROCEDURE TRAY) ×3 IMPLANT
PAD ABD 8X10 STRL (GAUZE/BANDAGES/DRESSINGS) ×2 IMPLANT
PAD ARMBOARD 7.5X6 YLW CONV (MISCELLANEOUS) ×6 IMPLANT
SET HNDPC FAN SPRY TIP SCT (DISPOSABLE) IMPLANT
STOCKINETTE IMPERVIOUS 9X36 MD (GAUZE/BANDAGES/DRESSINGS) ×2 IMPLANT
SUT ETHILON 2 0 PSLX (SUTURE) ×4 IMPLANT
SWAB COLLECTION DEVICE MRSA (MISCELLANEOUS) ×1 IMPLANT
SWAB CULTURE ESWAB REG 1ML (MISCELLANEOUS) IMPLANT
TOWEL OR 17X26 10 PK STRL BLUE (TOWEL DISPOSABLE) ×3 IMPLANT
TUBE CONNECTING 12'X1/4 (SUCTIONS) ×1
TUBE CONNECTING 12X1/4 (SUCTIONS) ×2 IMPLANT
YANKAUER SUCT BULB TIP NO VENT (SUCTIONS) ×3 IMPLANT

## 2018-12-07 NOTE — TOC Progression Note (Signed)
Transition of Care [facility]) - Progression Note    Patient Details  Name: Yolanda [name] MRN: [id number] Date of Birth: Aug 03, 1975  [facility] CM/SW Contact  Mearl Latin, LCSW Phone Number: 12/07/2018, 2:59 PM  Clinical Narrative:    Per [facility], patient only has [facility] Medicaid of [place]. Therefore patient unable to go to SNF in [place]. CSW left voicemail for [facility] to see if they have Medicaid beds available.    Expected Discharge Plan: Skilled Nursing Facility Barriers to Discharge: Continued Medical Work up  Expected Discharge Plan and Services Expected Discharge Plan: Skilled Nursing Facility In-house Referral: Clinical Social Work Discharge Planning Services: NA Post Acute Care Choice: Skilled Nursing Facility Living arrangements for the past 2 months: Single Family Home                 DME Arranged: N/A DME Agency: NA       HH Arranged: NA HH Agency: NA         Social Determinants of Health (SDOH) Interventions    Readmission Risk Interventions No flowsheet data found.

## 2018-12-07 NOTE — Anesthesia Preprocedure Evaluation (Addendum)
Anesthesia Evaluation  Patient identified by MRN, date of birth, ID band Patient awake    Reviewed: Allergy & Precautions, NPO status , Patient's Chart, lab work & pertinent test results  Airway Mallampati: II  TM Distance: >3 FB Neck ROM: Full    Dental no notable dental hx.    Pulmonary neg pulmonary ROS, Current Smoker,    Pulmonary exam normal breath sounds clear to auscultation       Cardiovascular negative cardio ROS Normal cardiovascular exam Rhythm:Regular Rate:Normal  Echo 12/04/2018  1. The left ventricle has normal systolic function, with an ejection fraction of 60-65%. The cavity size was normal. Left ventricular diastolic function could not be evaluated.  2. The right ventricle has normal systolc function. The cavity was normal. There is no increase in right ventricular wall thickness. Right ventricular systolic pressure is mildly elevated with an estimated pressure of 44.3 mmHg.  3. Left atrial size was mildly dilated.  4. The pericardial effusion is anterior to the right ventricle.  5. Trivial pericardial effusion is present.  6. The tricuspid valve was grossly normal.  7. The aortic valve is tricuspid Aortic valve regurgitation was not assessed by color flow Doppler.  SUMMARY No evidence for endocarditis.   Neuro/Psych  Neuromuscular disease negative psych ROS   GI/Hepatic negative GI ROS, Neg liver ROS,   Endo/Other  diabetes, Type 2, Insulin Dependent  Renal/GU negative Renal ROS     Musculoskeletal  (+) Arthritis , Osteoarthritis,    Abdominal   Peds  Hematology negative hematology ROS (+)   Anesthesia Other Findings   Reproductive/Obstetrics negative OB ROS                            Anesthesia Physical  Anesthesia Plan  ASA: III  Anesthesia Plan: General   Post-op Pain Management:    Induction: Intravenous  PONV Risk Score and Plan: 3 and Ondansetron,  Treatment may vary due to age or medical condition, Dexamethasone and Midazolam  Airway Management Planned: Oral ETT  Additional Equipment: None  Intra-op Plan:   Post-operative Plan: Extubation in OR  Informed Consent: I have reviewed the patients History and Physical, chart, labs and discussed the procedure including the risks, benefits and alternatives for the proposed anesthesia with the patient or authorized representative who has indicated his/her understanding and acceptance.     Dental advisory given  Plan Discussed with: CRNA  Anesthesia Plan Comments: (  )        Anesthesia Quick Evaluation

## 2018-12-07 NOTE — Op Note (Signed)
11/28/2018 - 12/07/2018  1:26 PM  PATIENT:  Yolanda [name]    PRE-OPERATIVE DIAGNOSIS:  abscess left wrist  POST-OPERATIVE DIAGNOSIS: Abscess left wrist and left hand.  PROCEDURE:  IRRIGATION AND DEBRIDEMENT LEFT WRIST Irrigation and debridement left hand.  SURGEON:  Nadara Mustard, MD  PHYSICIAN ASSISTANT:None ANESTHESIA:   General  PREOPERATIVE INDICATIONS:  Yolanda [name] is a  43 y.o. female with a diagnosis of abscess left wrist who failed conservative measures and elected for surgical management.    The risks benefits and alternatives were discussed with the patient preoperatively including but not limited to the risks of infection, bleeding, nerve injury, cardiopulmonary complications, the need for revision surgery, among others, and the patient was willing to proceed.  OPERATIVE IMPLANTS: None.  @ENCIMAGES @  OPERATIVE FINDINGS: Abscess from the wrist extended into the forearm and extended into the thenar and hyperthenar eminence.  Tissue tendon and soft tissue were sent for cultures.  Patient had a purulent abscess  OPERATIVE PROCEDURE:.  Patient was brought to the operating room and underwent a general anesthetic.  After adequate levels anesthesia were obtained patient's left upper extremity was prepped using DuraPrep draped into a sterile field a timeout was called.  A volar incision was made over the palmaris tendon over the volar aspect of the wrist.  Blunt dissection was carried down there is a large purulent abscess this was irrigated and debrided.  This abscess extended into the hand and the incision was carried along the flexor crease of the hand in line with the third webspace between the thenar and hyperthenar eminences.  The abscess extended into both the thenar and hyperthenar eminence.  These areas were also debrided.  Pulsatile lavage was then used to irrigate all of the soft tissue.  Rondure was used to excise soft tissue the palmaris tendon was sharply excised it  was necrotic and nonviable.  The incision was closed loosely with 2-0 nylon.  Sterile dressing was applied patient was extubated taken the PACU in stable condition.   DISCHARGE PLANNING:  Antibiotic duration: Continue IV antibiotics and adjust according to wound cultures  Weightbearing: Not applicable  Pain medication: Continue current pain medication  Dressing care/ Wound VAC: Dry dressing change and follow-up  Ambulatory devices: Nonweightbearing left hand  Discharge to: Anticipate discharge to skilled nursing  Follow-up: In the office 1 week post operative.

## 2018-12-07 NOTE — Transfer of Care (Signed)
Immediate Anesthesia Transfer of Care Note  Patient: Yolanda [name]  Procedure(s) Performed: IRRIGATION AND DEBRIDEMENT LEFT WRIST (Left Wrist)  Patient Location: PACU  Anesthesia Type:General  Level of Consciousness: awake, alert  and oriented  Airway & Oxygen Therapy: Patient Spontanous Breathing and Patient connected to nasal cannula oxygen  Post-op Assessment: Report given to RN, Post -op Vital signs reviewed and stable and Patient moving all extremities  Post vital signs: Reviewed and stable  Last Vitals:  Vitals Value Taken Time  BP 149/87 12/07/2018  1:23 PM  Temp    Pulse 116 12/07/2018  1:24 PM  Resp 21 12/07/2018  1:24 PM  SpO2 100 % 12/07/2018  1:24 PM  Vitals shown include unvalidated device data.  Last Pain:  Vitals:   12/07/18 1000  TempSrc:   PainSc: 0-No pain      Patients Stated Pain Goal: 0 (12/01/18 2320)  Complications: No apparent anesthesia complications

## 2018-12-07 NOTE — Interval H&P Note (Signed)
History and Physical Interval Note:  12/07/2018 7:19 AM  Yolanda [name]  has presented today for surgery, with the diagnosis of osteomyelitis left foot abscess left wrist.  The various methods of treatment have been discussed with the patient and family. After consideration of risks, benefits and other options for treatment, the patient has consented to  Procedure(s): IRRIGATION AND DEBRIDEMENT LEFT WRIST (Left) as a surgical intervention.  The patient's history has been reviewed, patient examined, no change in status, stable for surgery.  I have reviewed the patient's chart and labs.  Questions were answered to the patient's satisfaction.     Lazaro Arms, PA-C [facility] [phone]

## 2018-12-07 NOTE — Progress Notes (Signed)
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  On 5/20, patient presented at [facility] with complaint of worsening pain and swelling of her right foot for 1 week.  CT scanning was obtained which was concerning for destruction of the entire mid and hindfoot with fluid and gas collections, surrounding cellulitis. She was transferred to [facility] as a direct admit.  On presentation, patient was septic with a heart rate 80s, blood pressure in 80s and lactate elevated to 5.7. Infectious disease and orthopedic consult were obtained.  Assessment & Plan:   Principal Problem:   MRSA bacteremia Active Problems:   Diabetes mellitus type 2, uncontrolled, with complications ([place])   Charcot foot due to diabetes mellitus ([place])   Severe protein-calorie malnutrition ([place])   Diabetic polyneuropathy associated with type 2 diabetes mellitus ([place])   Cigarette smoker   Subacute osteomyelitis, right ankle and foot ([place])   Cutaneous abscess of right foot   Abscess of bursa, left wrist   Abscess of left hand   Sepsis, present on admission MRSA septicemia Diabetic right foot infection, osteomyelitis Patient presenting as a transfer from [facility] rocking him with worsening pain and swelling over right foot x1 week.  CT foot notable for destruction of the entire midfoot and hindfoot with fluid and gas collections and surrounding cellulitis.  ESR 136, CRP 22.1.  Lactic acid 1.4.  Blood Cultures from 020 2 out of 2 positive for MRSA. --Orthopedics, Dr. 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 --Infectious disease following, appreciate assistance --TEE on 5/22 with no indication of endocarditis --repeat blood cultures x 2 on 5/22 negative --ID recommends to complete 4 weeks of IV vancomycin, through 12/25/2018 --PICC line placed --Goal vancomycin trough 15-20; pharmacy for monitoring/dosing  Left wrist extending into forearm with and palmaris tendon excision 2/2 necrosis MRI notable for heterogeneous focal abnormality of the volar subcutaneous fat measuring 2.6 x 1.3 x 3 cm concerning for phlegmon versus developing abscess. --Status post irrigation and debridement by orthopedics on 12/07/2018 with findings of purulent abscess with extension from the wrist into the forearm and necrotic soft tissue/palmaris tendon status post excision. --Follow-up operative cultures --Continue IV antibiotics with vancomycin as above --Nonweightbearing left wrist --Follow-up orthopedics outpatient 1 week postop  Type 2 diabetes mellitus Hemoglobin A1c 7.9.  Home regimen includes Humalog 75/25 30u BID and NovoLog 3 units 3 times daily AC. --Continue Lantus 20 units [place] BID --Novolog 6u TIDAC --Moderate dose insulin sliding scale for further coverage --continue CBG's qAC/HS  Essential hypertension BP stable  Right lower extremity DVT Diagnosed in 07/2018, used to be on Xarelto, she completed the starter pack however Xarelto was discontinued due to bleeding issues at [facility] in 09/2018. Right lower extremity Dopplers on 3/29 did not show any DVT, so Xarelto was not resumed.  Hypercalcemia: resolved Likely due to sepsis, dehydration, calcium 10.5 with a time of admission  Severe protein calorie malnutrition Albumin 1.2 --Placed on pro-stat, nutritional  supplements --Encourage increased oral intake  Acute on chronic anemia  --H&H currently stable baseline 9-10 --Hemoglobin 7.4 at OSH, transfused 1 unit packed RBC,  --H&H 7.7 this morning. --Continue to monitor hemoglobin and transfuse for less than 7.    DVT prophylaxis: Heparin Code Status: Full code Family Communication: none Disposition Plan: Pending SNF placement, case management for coordination   Consultants:   Orthopedics, Dr. Sharol Given  Infectious disease, Dr. Megan Salon  Procedures:   Transtibial amputation right lower extremity 11/30/2018  PICC line placement, RUE 5/26  Antimicrobials:   Vancomycin 11/28/2018>>>   Subjective: Patient seen and examined at bedside, resting comfortably in bed.  No other complaints this morning.  Our left wrist shows fluid collection, and orthopedics plans on debridement and irrigation this afternoon.  Denies headache, no fever/chills/night sweats, no nausea/vomiting/diarrhea, no chest pain, no palpitations, no abdominal pain.  No acute events overnight per nursing staff.  Objective: Vitals:   12/07/18 0628 12/07/18 1100 12/07/18 1323 12/07/18 1340  BP:  (!) 182/92 (!) 149/87 (!) 147/92  Pulse:   (!) 116 (!) 113  Resp:  _0 Temp:  98.4 F (36.9 C) (!) 97.5 F (36.4 C) (!) 97.5 F (36.4 C)  TempSrc:      SpO2:  98% 100% 100%  Weight: 82.8 kg     Height:        Intake/Output Summary (Last 24 hours) at 12/07/2018 1433 Last data filed at 12/07/2018 1253 Gross per 24 hour  Intake 1436.93 ml  Output 1800 ml  Net -363.07 ml   Filed Weights   12/04/18 0657 12/06/18 0700 12/07/18 0628  Weight: 77.6 kg 83.1 kg 82.8 kg    Examination:  General exam: Appears calm and comfortable  Respiratory system: Clear to auscultation. Respiratory effort normal. Cardiovascular system: S1 & S2 heard, RRR. No JVD, murmurs, rubs, gallops or clicks. No pedal edema. Gastrointestinal system: Abdomen is nondistended, soft and nontender. No  organomegaly or masses felt. Normal bowel sounds heard. Central nervous system: Alert and oriented. No focal neurological deficits. Extremities: Edema noted to left wrist, no erythema or fluctuance, dressing in place to right lower extremity Skin: No rashes, lesions or ulcers Psychiatry: Judgement and insight appear normal. Mood & affect appropriate.     Data Reviewed: I have personally reviewed following labs and imaging studies  CBC: Recent Labs  Lab 12/01/18 0301 12/02/18 0258 12/04/18 0259 12/06/18 0402 12/07/18 0422  WBC 25.3* 21.0* 22.0* 15.8* 13.6*  HGB 7.6* 7.6* 7.7* 7.3* 7.0*  HCT 22.8* 22.9* 23.7* 23.1* 22.3*  MCV 90.8 91.6 93.3 95.9 98.7  PLT 334 346 366 412* 161   Basic Metabolic Panel: Recent Labs  Lab 12/01/18 0301 12/02/18 0258 12/04/18 0259 12/06/18 0402 12/07/18 0422  NA 133* 137 139 139 139  K 3.8 3.5 3.4* 3.7 3.5  CL 107 108 110 108 110  CO2 21* _1 GLUCOSE 341* 119* 78 235* 150*  BUN _2 CREATININE 0.76 0.65 0.51 0.59 0.41*  CALCIUM 8.3* 7.9* 7.3* 6.6* 6.9*  MG  --   --   --  1.6* 1.5*   GFR: Estimated Creatinine Clearance: 99.4 mL/min (A) (by C-G formula based on SCr of 0.41 mg/dL (L)). Liver Function Tests: No results for input(s): AST, ALT, ALKPHOS, BILITOT, PROT, ALBUMIN in the last 168 hours. No results for input(s): LIPASE, AMYLASE in the last 168 hours. No results for input(s): AMMONIA in the last 168 hours. Coagulation Profile: No  results for input(s): INR, PROTIME in the last 168 hours. Cardiac Enzymes: No results for input(s): CKTOTAL, CKMB, CKMBINDEX, TROPONINI in the last 168 hours. BNP (last 3 results) No results for input(s): PROBNP in the last 8760 hours. HbA1C: No results for input(s): HGBA1C in the last 72 hours. CBG: Recent Labs  Lab 12/06/18 1849 12/06/18 2229 12/07/18 0750 12/07/18 1124 12/07/18 1326  GLUCAP 273* 133* 109* 76 86   Lipid Profile: No results for input(s): CHOL, HDL, LDLCALC,  TRIG, CHOLHDL, LDLDIRECT in the last 72 hours. Thyroid Function Tests: No results for input(s): TSH, T4TOTAL, FREET4, T3FREE, THYROIDAB in the last 72 hours. Anemia Panel: No results for input(s): VITAMINB12, FOLATE, FERRITIN, TIBC, IRON, RETICCTPCT in the last 72 hours. Sepsis Labs: No results for input(s): PROCALCITON, LATICACIDVEN in the last 168 hours.  Recent Results (from the past 240 hour(s))  MRSA PCR Screening     Status: Abnormal   Collection Time: 11/28/18  5:30 PM  Result Value Ref Range Status   MRSA by PCR POSITIVE (A) NEGATIVE Final    Comment:        The GeneXpert MRSA Assay (FDA approved for NASAL specimens only), is one component of a comprehensive MRSA colonization surveillance program. It is not intended to diagnose MRSA infection nor to guide or monitor treatment for MRSA infections. RESULT CALLED TO, READ BACK BY AND VERIFIED WITHJefferson Fuel RN 11/28/18 1928 JDW Performed at [facility], [address], [place], [place] [address]   SARS Coronavirus 2 [facility] order, Performed in [facility] hospital lab)     Status: None   Collection Time: 11/28/18  6:29 PM  Result Value Ref Range Status   SARS Coronavirus 2 NEGATIVE NEGATIVE Final    Comment: (NOTE) If result is NEGATIVE SARS-CoV-2 target nucleic acids are NOT DETECTED. The SARS-CoV-2 RNA is generally detectable in upper and lower  respiratory specimens during the acute phase of infection. The lowest  concentration of SARS-CoV-2 viral copies this assay can detect is 250  copies / mL. A negative result does not preclude SARS-CoV-2 infection  and should not be used as the sole basis for treatment or other  patient management decisions.  A negative result may occur with  improper specimen collection / handling, submission of specimen other  than nasopharyngeal swab, presence of viral mutation(s) within the  areas targeted by this assay, and inadequate number of viral copies  (<250 copies / mL). A  negative result must be combined with clinical  observations, patient history, and epidemiological information. If result is POSITIVE SARS-CoV-2 target nucleic acids are DETECTED. The SARS-CoV-2 RNA is generally detectable in upper and lower  respiratory specimens dur ing the acute phase of infection.  Positive  results are indicative of active infection with SARS-CoV-2.  Clinical  correlation with patient history and other diagnostic information is  necessary to determine patient infection status.  Positive results do  not rule out bacterial infection or co-infection with other viruses. If result is PRESUMPTIVE POSTIVE SARS-CoV-2 nucleic acids MAY BE PRESENT.   A presumptive positive result was obtained on the submitted specimen  and confirmed on repeat testing.  While 2019 novel coronavirus  (SARS-CoV-2) nucleic acids may be present in the submitted sample  additional confirmatory testing may be necessary for epidemiological  and / or clinical management purposes  to differentiate between  SARS-CoV-2 and other Sarbecovirus currently known to infect humans.  If clinically indicated additional testing with an alternate test  methodology [phone]) is  advised. The SARS-CoV-2 RNA is generally  detectable in upper and lower respiratory sp ecimens during the acute  phase of infection. The expected result is Negative. Fact Sheet for Patients:  StrictlyIdeas.no Fact Sheet for Healthcare Providers: BankingDealers.co.za This test is not yet approved or cleared by the [place] FDA and has been authorized for detection and/or diagnosis of SARS-CoV-2 by FDA under an Emergency Use Authorization (EUA).  This EUA will remain in effect (meaning this test can be used) for the duration of the COVID-19 declaration under Section 564(b)(1) of the Act, 21 U.S.C. section 360bbb-3(b)(1), unless the authorization is terminated or revoked sooner. Performed  at [facility], [place] [address], [place], [place] [address]   Culture, blood (routine x 2)     Status: Abnormal   Collection Time: 11/28/18  6:29 PM  Result Value Ref Range Status   Specimen Description BLOOD RIGHT ARM  Final   Special Requests   Final    BOTTLES DRAWN AEROBIC AND ANAEROBIC Blood Culture adequate volume   Culture  Setup Time   Final    GRAM POSITIVE COCCI IN BOTH AEROBIC AND ANAEROBIC BOTTLES CRITICAL RESULT CALLED TO, READ BACK BY AND VERIFIED WITHBronwen Betters [facility] 9024 11/29/18 A BROWNING Performed at [facility], [place] [address] [place], [place] [address]    Culture METHICILLIN RESISTANT STAPHYLOCOCCUS AUREUS (A)  Final   Report Status 12/01/2018 FINAL  Final   Organism ID, Bacteria METHICILLIN RESISTANT STAPHYLOCOCCUS AUREUS  Final      Susceptibility   Methicillin resistant staphylococcus aureus - MIC*    CIPROFLOXACIN >=8 RESISTANT Resistant     ERYTHROMYCIN >=8 RESISTANT Resistant     GENTAMICIN <=0.5 SENSITIVE Sensitive     OXACILLIN >=4 RESISTANT Resistant     TETRACYCLINE <=1 SENSITIVE Sensitive     VANCOMYCIN <=0.5 SENSITIVE Sensitive     TRIMETH/SULFA <=10 SENSITIVE Sensitive     CLINDAMYCIN >=8 RESISTANT Resistant     RIFAMPIN <=0.5 SENSITIVE Sensitive     Inducible Clindamycin NEGATIVE Sensitive     * METHICILLIN RESISTANT STAPHYLOCOCCUS AUREUS  Blood Culture ID Panel (Reflexed)     Status: Abnormal   Collection Time: 11/28/18  6:29 PM  Result Value Ref Range Status   Enterococcus species NOT DETECTED NOT DETECTED Final   Listeria monocytogenes NOT DETECTED NOT DETECTED Final   Staphylococcus species DETECTED (A) NOT DETECTED Final    Comment: CRITICAL RESULT CALLED TO, READ BACK BY AND VERIFIED WITH: Bronwen Betters PHARMD 1612 11/29/18 A BROWNING    Staphylococcus aureus (BCID) DETECTED (A) NOT DETECTED Final    Comment: Methicillin (oxacillin)-resistant Staphylococcus aureus (MRSA). MRSA is predictably resistant to beta-lactam antibiotics  (except ceftaroline). Preferred therapy is vancomycin unless clinically contraindicated. Patient requires contact precautions if  hospitalized. CRITICAL RESULT CALLED TO, READ BACK BY AND VERIFIED WITH: L CURRAN PHARMD 3299 11/29/18 A BROWNING    Methicillin resistance DETECTED (A) NOT DETECTED Final    Comment: CRITICAL RESULT CALLED TO, READ BACK BY AND VERIFIED WITH: L CURRAN PHARMD 1612 11/29/18 A BROWNING    Streptococcus species NOT DETECTED NOT DETECTED Final   Streptococcus agalactiae NOT DETECTED NOT DETECTED Final   Streptococcus pneumoniae NOT DETECTED NOT DETECTED Final   Streptococcus pyogenes NOT DETECTED NOT DETECTED Final   Acinetobacter baumannii NOT DETECTED NOT DETECTED Final   Enterobacteriaceae species NOT DETECTED NOT DETECTED Final   Enterobacter cloacae complex NOT DETECTED NOT DETECTED Final   Escherichia coli NOT DETECTED NOT DETECTED Final  Klebsiella oxytoca NOT DETECTED NOT DETECTED Final   Klebsiella pneumoniae NOT DETECTED NOT DETECTED Final   Proteus species NOT DETECTED NOT DETECTED Final   Serratia marcescens NOT DETECTED NOT DETECTED Final   Haemophilus influenzae NOT DETECTED NOT DETECTED Final   Neisseria meningitidis NOT DETECTED NOT DETECTED Final   Pseudomonas aeruginosa NOT DETECTED NOT DETECTED Final   Candida albicans NOT DETECTED NOT DETECTED Final   Candida glabrata NOT DETECTED NOT DETECTED Final   Candida krusei NOT DETECTED NOT DETECTED Final   Candida parapsilosis NOT DETECTED NOT DETECTED Final   Candida tropicalis NOT DETECTED NOT DETECTED Final    Comment: Performed at [facility], [place] [address] [place], [place] [address]  Culture, blood (routine x 2)     Status: Abnormal   Collection Time: 11/28/18  7:29 PM  Result Value Ref Range Status   Specimen Description BLOOD LEFT ARM  Final   Special Requests   Final    BOTTLES DRAWN AEROBIC AND ANAEROBIC Blood Culture adequate volume   Culture  Setup Time   Final    GRAM  POSITIVE COCCI IN BOTH AEROBIC AND ANAEROBIC BOTTLES CRITICAL VALUE NOTED.  VALUE IS CONSISTENT WITH PREVIOUSLY REPORTED AND CALLED VALUE.    Culture (A)  Final    STAPHYLOCOCCUS AUREUS SUSCEPTIBILITIES PERFORMED ON PREVIOUS CULTURE WITHIN THE LAST 5 DAYS. Performed at [facility], [place] [address], [place], [place] [address]    Report Status 12/01/2018 FINAL  Final  Culture, Urine     Status: None   Collection Time: 11/29/18 12:58 PM  Result Value Ref Range Status   Specimen Description URINE, CATHETERIZED  Final   Special Requests NONE  Final   Culture   Final    NO GROWTH Performed at [facility], [place] [address] [place] [address] [place], [place] [address]    Report Status 11/30/2018 FINAL  Final  Culture, blood (routine x 2)     Status: None   Collection Time: 11/30/18  3:22 PM  Result Value Ref Range Status   Specimen Description BLOOD LEFT ANTECUBITAL  Final   Special Requests   Final    BOTTLES DRAWN AEROBIC ONLY Blood Culture adequate volume   Culture   Final    NO GROWTH 5 DAYS Performed at [facility], [place] [address] [place], [place] [address]    Report Status 12/05/2018 FINAL  Final  Culture, blood (routine x 2)     Status: None   Collection Time: 11/30/18  3:22 PM  Result Value Ref Range Status   Specimen Description BLOOD RIGHT HAND  Final   Special Requests   Final    BOTTLES DRAWN AEROBIC ONLY Blood Culture adequate volume   Culture   Final    NO GROWTH 5 DAYS Performed at [facility], [place] [address], [place],  [address]    Report Status 12/05/2018 FINAL  Final         Radiology Studies: Mr Wrist Left Wo Contrast  Result Date: 12/05/2018 CLINICAL DATA:  Chronic left wrist swelling. Patient's radiographs shows no destructive bony changes, subluxation, or fractures. White cell count 22,000 uric acid within normal range. I EXAM: MR OF THE LEFT WRIST WITHOUT CONTRAST TECHNIQUE: Multiplanar, multisequence MR imaging of the left wrist was  performed. No intravenous contrast was administered. COMPARISON:  None. FINDINGS: Ligaments: Intact scapholunate and lunotriquetral ligaments. Triangular fibrocartilage: Intact TFCC. Tendons: Intact flexor and extensor compartment tendons. Carpal tunnel/median nerve: Normal carpal tunnel.  Normal median nerve. Guyon's canal: Normal. Joint/cartilage: No joint effusion. No synovitis. No intra-articular loose body. Small amount of joint fluid in the pisotriquetral joint. No chondral defect. Bones/carpal alignment: No acute osseous abnormality. No aggressive osseous lesion. No periosteal reaction or bone destruction. Normal alignment. Other: Soft tissue edema circumferentially around the wrist most concerning for cellulitis. Heterogeneous T2 hyperintense and T1 hypointense area in the volar subcutaneous fat measuring approximately 2.6 x 1.3 x 3 cm which may reflect phlegmonous changes versus a developing abscess. Muscle edema in the opponens pollicis muscle, adductor digiti minimi, and flexor digiti minimi muscles concerning for myositis secondary to an infectious or inflammatory etiology. IMPRESSION: 1. Cellulitis of left wrist. 2. Heterogeneous focal abnormality in the volar subcutaneous fat measuring 2.6 x 1.3 x 3 cm which may reflect a phlegmon versus developing abscess. If there is further clinical concern recommend MRI of the left wrist with contrast. 3. Muscle edema in the opponens pollicis muscle, adductor digiti minimi, and flexor digiti minimi muscles concerning for myositis secondary to an infectious or inflammatory etiology. 4. No evidence of septic arthritis of the left wrist. Electronically Signed   By: Kathreen Devoid   On: 12/05/2018 21:39        Scheduled Meds: . colchicine  0.6 mg Oral BID  . docusate sodium  100 mg Oral BID  . heparin injection (subcutaneous)  5,000 Units Subcutaneous Q8H  . insulin aspart  0-15 Units Subcutaneous TID WC  . insulin aspart  0-5 Units Subcutaneous QHS  .  insulin aspart  6 Units Subcutaneous TID WC  . insulin glargine  25 Units Subcutaneous BID  . multivitamin with minerals  1 tablet Oral Daily  . Ensure Max Protein  11 oz Oral Daily   Continuous Infusions: . sodium chloride 250 mL (11/30/18 0650)  . sodium chloride 10 mL/hr at 12/04/18 2231  . lactated ringers 10 mL/hr at 12/07/18 1121  . lactated ringers    . magnesium sulfate bolus IVPB    . methocarbamol (ROBAXIN) IV    . vancomycin 1,000 mg (12/06/18 2227)     LOS: 9 days    Time spent: 20 minutes    Elane Peabody J [place] DO [facility] Pager [phone]  If 7PM-7AM, please contact night-coverage [email or web address] Password [facility] 12/07/2018, 2:33 PM

## 2018-12-07 NOTE — Anesthesia Procedure Notes (Signed)
Procedure Name: Intubation Date/Time: 12/07/2018 12:41 PM Performed by: Charm Stenner T, CRNA Pre-anesthesia Checklist: Patient identified, Emergency Drugs available, Suction available and Patient being monitored Patient Re-evaluated:Patient Re-evaluated prior to induction Oxygen Delivery Method: Circle system utilized Preoxygenation: Pre-oxygenation with 100% oxygen Induction Type: IV induction and Rapid sequence Laryngoscope Size: Miller and 2 Grade View: Grade I Tube type: Oral Tube size: 7.5 mm Number of attempts: 1 Airway Equipment and Method: Patient positioned with wedge pillow and Stylet Placement Confirmation: ETT inserted through vocal cords under direct vision,  positive ETCO2 and breath sounds checked- equal and bilateral Secured at: 22 cm Tube secured with: Tape Dental Injury: Teeth and Oropharynx as per pre-operative assessment

## 2018-12-08 LAB — CBC
HCT: 23.8 % — ABNORMAL LOW (ref 36.0–46.0)
Hemoglobin: 7.5 g/dL — ABNORMAL LOW (ref 12.0–15.0)
MCH: 30.7 pg (ref 26.0–34.0)
MCHC: 31.5 g/dL (ref 30.0–36.0)
MCV: 97.5 fL (ref 80.0–100.0)
Platelets: 414 10*3/uL — ABNORMAL HIGH (ref 150–400)
RBC: 2.44 MIL/uL — ABNORMAL LOW (ref 3.87–5.11)
RDW: 17.9 % — ABNORMAL HIGH (ref 11.5–15.5)
WBC: 17.5 10*3/uL — ABNORMAL HIGH (ref 4.0–10.5)
nRBC: 0.1 % (ref 0.0–0.2)

## 2018-12-08 LAB — BASIC METABOLIC PANEL
Anion gap: 9 (ref 5–15)
BUN: 10 mg/dL (ref 6–20)
CO2: 25 mmol/L (ref 22–32)
Calcium: 7.2 mg/dL — ABNORMAL LOW (ref 8.9–10.3)
Chloride: 101 mmol/L (ref 98–111)
Creatinine, Ser: 0.5 mg/dL (ref 0.44–1.00)
GFR calc Af Amer: >60 mL/min (ref >60)
GFR calc non Af Amer: >60 mL/min (ref >60)
Glucose, Bld: 326 mg/dL — ABNORMAL HIGH (ref 70–99)
Potassium: 3.8 mmol/L (ref 3.5–5.1)
Sodium: 135 mmol/L (ref 135–145)

## 2018-12-08 LAB — GLUCOSE, CAPILLARY
Glucose-Capillary: 374 mg/dL — ABNORMAL HIGH (ref 70–99)
Glucose-Capillary: 398 mg/dL — ABNORMAL HIGH (ref 70–99)
Glucose-Capillary: 270 mg/dL — ABNORMAL HIGH (ref 70–99)
Glucose-Capillary: 161 mg/dL — ABNORMAL HIGH (ref 70–99)

## 2018-12-08 LAB — MAGNESIUM: Magnesium: 1.6 mg/dL — ABNORMAL LOW (ref 1.7–2.4)

## 2018-12-08 MED ORDER — INSULIN GLARGINE 100 UNIT/ML ~~LOC~~ SOLN
30.0000 [IU] | Freq: Two times a day (BID) | SUBCUTANEOUS | Status: DC
  Administered 2018-12-08: 30 [IU] via SUBCUTANEOUS
  Filled 2018-12-08 (×3): qty 0.3

## 2018-12-08 MED ORDER — INSULIN ASPART 100 UNIT/ML ~~LOC~~ SOLN
10.0000 [IU] | Freq: Three times a day (TID) | SUBCUTANEOUS | Status: DC
  Administered 2018-12-08: 10 [IU] via SUBCUTANEOUS

## 2018-12-08 NOTE — Progress Notes (Signed)
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  On 5/20, patient presented at [facility] with complaint of worsening pain and swelling of her right foot for 1 week.  CT scanning was obtained which was concerning for destruction of the entire mid and hindfoot with fluid and gas collections, surrounding cellulitis. She was transferred to [facility] as a direct admit.  On presentation, patient was septic with a heart rate 80s, blood pressure in 80s and lactate elevated to 5.7. Infectious disease and orthopedic consult were obtained.  Assessment & Plan:   Principal Problem:   MRSA bacteremia Active Problems:   Diabetes mellitus type 2, uncontrolled, with complications ([place])   Charcot foot due to diabetes mellitus ([place])   Severe protein-calorie malnutrition ([place])   Diabetic polyneuropathy associated with type 2 diabetes mellitus ([place])   Cigarette smoker   Subacute osteomyelitis, right ankle and foot ([place])   Cutaneous abscess of right foot   Abscess of bursa, left wrist   Abscess of left hand   Sepsis, present on admission MRSA septicemia Diabetic right foot infection, osteomyelitis Patient presenting as a transfer from [facility] rocking him with worsening pain and swelling over right foot x1 week.  CT foot notable for destruction of the entire midfoot and hindfoot with fluid and gas collections and surrounding cellulitis.  ESR 136, CRP 22.1.  Lactic acid 1.4.  Blood Cultures from 020 2 out of 2 positive for MRSA. --Orthopedics, Dr. 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 --Infectious disease following, appreciate assistance --TEE on 5/22 with no indication of endocarditis --repeat blood cultures x 2 on 5/22 negative --ID recommends to complete 4 weeks of IV vancomycin, through 12/25/2018 --PICC line placed --Goal vancomycin trough 15-20; pharmacy for monitoring/dosing  Left wrist extending into forearm with and palmaris tendon excision 2/2 necrosis MRI notable for heterogeneous focal abnormality of the volar subcutaneous fat measuring 2.6 x 1.3 x 3 cm concerning for phlegmon versus developing abscess. --Status post irrigation and debridement by orthopedics on 12/07/2018 with findings of purulent abscess with extension from the wrist into the forearm and necrotic soft tissue/palmaris tendon status post excision. --Intraoperative culture pending --Continue IV antibiotics with vancomycin as above --Nonweightbearing left wrist --Follow-up orthopedics outpatient 1 week postop  Type 2 diabetes mellitus Hemoglobin A1c 7.9.  Home regimen includes Humalog 75/25 30u BID and NovoLog 3 units 3 times daily AC. --Glucose elevated into the 200s-300s following surgical intervention --Increase Lantus from 25 to 30 units [place] BID --Increase Novolog from 6u to 10u TIDAC --Moderate dose insulin sliding scale for further coverage --continue CBG's qAC/HS  Essential hypertension BP stable  Right lower extremity DVT Diagnosed in 07/2018, used to be on Xarelto, she completed the starter pack however Xarelto was discontinued due to bleeding issues at [facility] in 09/2018. Right lower extremity Dopplers on 3/29 did not show any DVT, so Xarelto was not resumed.  Hypercalcemia: resolved Likely due to sepsis, dehydration, calcium 10.5 with  a  time of admission  Severe protein calorie malnutrition Albumin 1.2 --Placed on pro-stat, nutritional supplements --Encourage increased oral intake  Acute on chronic anemia  --H&H currently stable baseline 9-10 --Hemoglobin 7.4 at OSH, transfused 1 unit packed RBC,  --H&H 7.5 this morning. --Continue to monitor hemoglobin and transfuse for less than 7.    DVT prophylaxis: Heparin Code Status: Full code Family Communication: none Disposition Plan: Pending SNF placement, case management for coordination   Consultants:   Orthopedics, Dr. Sharol Given  Infectious disease, Dr. Megan Salon  Procedures:   Transtibial amputation right lower extremity 11/30/2018  PICC line placement, RUE 5/26  Antimicrobials:   Vancomycin 11/28/2018>>>   Subjective: Patient seen and examined at bedside, resting comfortably in bed.  No other complaints this morning.  Denies headache, no fever/chills/night sweats, no nausea/vomiting/diarrhea, no chest pain, no palpitations, no abdominal pain.  No acute events overnight per nursing staff.  Objective: Vitals:   12/08/18 0544 12/08/18 0545 12/08/18 0821 12/08/18 1347  BP: 127/68  (!) 152/90 (!) 165/88  Pulse: (!) 101  (!) 110 (!) 101  Resp: 18   20  Temp: 97.7 F (36.5 C)  97.8 F (36.6 C) 98.4 F (36.9 C)  TempSrc: Oral  Oral Oral  SpO2: 97%  100% 100%  Weight:  80.3 kg    Height:        Intake/Output Summary (Last 24 hours) at 12/08/2018 1447 Last data filed at 12/08/2018 1343 Gross per 24 hour  Intake 770.05 ml  Output 1400 ml  Net -629.95 ml   Filed Weights   12/06/18 0700 12/07/18 0628 12/08/18 0545  Weight: 83.1 kg 82.8 kg 80.3 kg    Examination:  General exam: Appears calm and comfortable  Respiratory system: Clear to auscultation. Respiratory effort normal. Cardiovascular system: S1 & S2 heard, RRR. No JVD, murmurs, rubs, gallops or clicks. No pedal edema. Gastrointestinal system: Abdomen is nondistended, soft and nontender. No  organomegaly or masses felt. Normal bowel sounds heard. Central nervous system: Alert and oriented. No focal neurological deficits. Extremities: Edema noted to left wrist, no erythema or fluctuance, dressing in place to right lower extremity Skin: No rashes, lesions or ulcers Psychiatry: Judgement and insight appear normal. Mood & affect appropriate.     Data Reviewed: I have personally reviewed following labs and imaging studies  CBC: Recent Labs  Lab 12/02/18 0258 12/04/18 0259 12/06/18 0402 12/07/18 0422 12/08/18 0457  WBC 21.0* 22.0* 15.8* 13.6* 17.5*  HGB 7.6* 7.7* 7.3* 7.0* 7.5*  HCT 22.9* 23.7* 23.1* 22.3* 23.8*  MCV 91.6 93.3 95.9 98.7 97.5  PLT 346 366 412* 377 646*   Basic Metabolic Panel: Recent Labs  Lab 12/02/18 0258 12/04/18 0259 12/06/18 0402 12/07/18 0422 12/08/18 0457  NA 137 139 139 139 135  K 3.5 3.4* 3.7 3.5 3.8  CL 108 110 108 110 101  CO2 '24 24 24 24 25  ' GLUCOSE 119* 78 235* 150* 326*  BUN '12 9 6 8 10  ' CREATININE 0.65 0.51 0.59 0.41* 0.50  CALCIUM 7.9* 7.3* 6.6* 6.9* 7.2*  MG  --   --  1.6* 1.5* 1.6*   GFR: Estimated Creatinine Clearance: 97.9 mL/min (by C-G formula based on SCr of 0.5 mg/dL). Liver Function Tests: No results for input(s): AST, ALT, ALKPHOS, BILITOT, PROT, ALBUMIN in the last 168 hours. No results for input(s): LIPASE, AMYLASE in the last 168 hours. No results for input(s): AMMONIA in the last 168 hours. Coagulation Profile: No results for input(s): INR, PROTIME in  the last 168 hours. Cardiac Enzymes: No results for input(s): CKTOTAL, CKMB, CKMBINDEX, TROPONINI in the last 168 hours. BNP (last 3 results) No results for input(s): PROBNP in the last 8760 hours. HbA1C: No results for input(s): HGBA1C in the last 72 hours. CBG: Recent Labs  Lab 12/07/18 1326 12/07/18 1734 12/07/18 2217 12/08/18 0726 12/08/18 1238  GLUCAP 86 385* 297* 374* 398*   Lipid Profile: No results for input(s): CHOL, HDL, LDLCALC, TRIG,  CHOLHDL, LDLDIRECT in the last 72 hours. Thyroid Function Tests: No results for input(s): TSH, T4TOTAL, FREET4, T3FREE, THYROIDAB in the last 72 hours. Anemia Panel: No results for input(s): VITAMINB12, FOLATE, FERRITIN, TIBC, IRON, RETICCTPCT in the last 72 hours. Sepsis Labs: No results for input(s): PROCALCITON, LATICACIDVEN in the last 168 hours.  Recent Results (from the past 240 hour(s))  MRSA PCR Screening     Status: Abnormal   Collection Time: 11/28/18  5:30 PM  Result Value Ref Range Status   MRSA by PCR POSITIVE (A) NEGATIVE Final    Comment:        The GeneXpert MRSA Assay (FDA approved for NASAL specimens only), is one component of a comprehensive MRSA colonization surveillance program. It is not intended to diagnose MRSA infection nor to guide or monitor treatment for MRSA infections. RESULT CALLED TO, READ BACK BY AND VERIFIED WITHJefferson Fuel RN 11/28/18 1928 JDW Performed at [facility], [address] [place], [place] [address]   SARS Coronavirus 2 [facility] order, Performed in [facility] hospital lab)     Status: None   Collection Time: 11/28/18  6:29 PM  Result Value Ref Range Status   SARS Coronavirus 2 NEGATIVE NEGATIVE Final    Comment: (NOTE) If result is NEGATIVE SARS-CoV-2 target nucleic acids are NOT DETECTED. The SARS-CoV-2 RNA is generally detectable in upper and lower  respiratory specimens during the acute phase of infection. The lowest  concentration of SARS-CoV-2 viral copies this assay can detect is 250  copies / mL. A negative result does not preclude SARS-CoV-2 infection  and should not be used as the sole basis for treatment or other  patient management decisions.  A negative result may occur with  improper specimen collection / handling, submission of specimen other  than nasopharyngeal swab, presence of viral mutation(s) within the  areas targeted by this assay, and inadequate number of viral copies  (<250 copies / mL). A  negative result must be combined with clinical  observations, patient history, and epidemiological information. If result is POSITIVE SARS-CoV-2 target nucleic acids are DETECTED. The SARS-CoV-2 RNA is generally detectable in upper and lower  respiratory specimens dur ing the acute phase of infection.  Positive  results are indicative of active infection with SARS-CoV-2.  Clinical  correlation with patient history and other diagnostic information is  necessary to determine patient infection status.  Positive results do  not rule out bacterial infection or co-infection with other viruses. If result is PRESUMPTIVE POSTIVE SARS-CoV-2 nucleic acids MAY BE PRESENT.   A presumptive positive result was obtained on the submitted specimen  and confirmed on repeat testing.  While 2019 novel coronavirus  (SARS-CoV-2) nucleic acids may be present in the submitted sample  additional confirmatory testing may be necessary for epidemiological  and / or clinical management purposes  to differentiate between  SARS-CoV-2 and other Sarbecovirus currently known to infect humans.  If clinically indicated additional testing with an alternate test  methodology [phone]) is advised. The SARS-CoV-2 RNA is generally  detectable in upper and lower respiratory sp ecimens during the acute  phase of infection. The expected result is Negative. Fact Sheet for Patients:  StrictlyIdeas.no Fact Sheet for Healthcare Providers: BankingDealers.co.za This test is not yet approved or cleared by the [place] FDA and has been authorized for detection and/or diagnosis of SARS-CoV-2 by FDA under an Emergency Use Authorization (EUA).  This EUA will remain in effect (meaning this test can be used) for the duration of the COVID-19 declaration under Section 564(b)(1) of the Act, 21 U.S.C. section 360bbb-3(b)(1), unless the authorization is terminated or revoked sooner. Performed  at [facility], [place] [address], [place], [place] [address]   Culture, blood (routine x 2)     Status: Abnormal   Collection Time: 11/28/18  6:29 PM  Result Value Ref Range Status   Specimen Description BLOOD RIGHT ARM  Final   Special Requests   Final    BOTTLES DRAWN AEROBIC AND ANAEROBIC Blood Culture adequate volume   Culture  Setup Time   Final    GRAM POSITIVE COCCI IN BOTH AEROBIC AND ANAEROBIC BOTTLES CRITICAL RESULT CALLED TO, READ BACK BY AND VERIFIED WITHBronwen Betters [facility] 5701 11/29/18 A BROWNING Performed at [facility], [place] [address] [place], [place] [address]    Culture METHICILLIN RESISTANT STAPHYLOCOCCUS AUREUS (A)  Final   Report Status 12/01/2018 FINAL  Final   Organism ID, Bacteria METHICILLIN RESISTANT STAPHYLOCOCCUS AUREUS  Final      Susceptibility   Methicillin resistant staphylococcus aureus - MIC*    CIPROFLOXACIN >=8 RESISTANT Resistant     ERYTHROMYCIN >=8 RESISTANT Resistant     GENTAMICIN <=0.5 SENSITIVE Sensitive     OXACILLIN >=4 RESISTANT Resistant     TETRACYCLINE <=1 SENSITIVE Sensitive     VANCOMYCIN <=0.5 SENSITIVE Sensitive     TRIMETH/SULFA <=10 SENSITIVE Sensitive     CLINDAMYCIN >=8 RESISTANT Resistant     RIFAMPIN <=0.5 SENSITIVE Sensitive     Inducible Clindamycin NEGATIVE Sensitive     * METHICILLIN RESISTANT STAPHYLOCOCCUS AUREUS  Blood Culture ID Panel (Reflexed)     Status: Abnormal   Collection Time: 11/28/18  6:29 PM  Result Value Ref Range Status   Enterococcus species NOT DETECTED NOT DETECTED Final   Listeria monocytogenes NOT DETECTED NOT DETECTED Final   Staphylococcus species DETECTED (A) NOT DETECTED Final    Comment: CRITICAL RESULT CALLED TO, READ BACK BY AND VERIFIED WITH: Bronwen Betters PHARMD 1612 11/29/18 A BROWNING    Staphylococcus aureus (BCID) DETECTED (A) NOT DETECTED Final    Comment: Methicillin (oxacillin)-resistant Staphylococcus aureus (MRSA). MRSA is predictably resistant to beta-lactam antibiotics  (except ceftaroline). Preferred therapy is vancomycin unless clinically contraindicated. Patient requires contact precautions if  hospitalized. CRITICAL RESULT CALLED TO, READ BACK BY AND VERIFIED WITH: L CURRAN PHARMD 0300 11/29/18 A BROWNING    Methicillin resistance DETECTED (A) NOT DETECTED Final    Comment: CRITICAL RESULT CALLED TO, READ BACK BY AND VERIFIED WITH: L CURRAN PHARMD 1612 11/29/18 A BROWNING    Streptococcus species NOT DETECTED NOT DETECTED Final   Streptococcus agalactiae NOT DETECTED NOT DETECTED Final   Streptococcus pneumoniae NOT DETECTED NOT DETECTED Final   Streptococcus pyogenes NOT DETECTED NOT DETECTED Final   Acinetobacter baumannii NOT DETECTED NOT DETECTED Final   Enterobacteriaceae species NOT DETECTED NOT DETECTED Final   Enterobacter cloacae complex NOT DETECTED NOT DETECTED Final   Escherichia coli NOT DETECTED NOT DETECTED Final   Klebsiella oxytoca NOT DETECTED NOT  DETECTED Final   Klebsiella pneumoniae NOT DETECTED NOT DETECTED Final   Proteus species NOT DETECTED NOT DETECTED Final   Serratia marcescens NOT DETECTED NOT DETECTED Final   Haemophilus influenzae NOT DETECTED NOT DETECTED Final   Neisseria meningitidis NOT DETECTED NOT DETECTED Final   Pseudomonas aeruginosa NOT DETECTED NOT DETECTED Final   Candida albicans NOT DETECTED NOT DETECTED Final   Candida glabrata NOT DETECTED NOT DETECTED Final   Candida krusei NOT DETECTED NOT DETECTED Final   Candida parapsilosis NOT DETECTED NOT DETECTED Final   Candida tropicalis NOT DETECTED NOT DETECTED Final    Comment: Performed at [facility], [place] [address], [place], [place] [address]  Culture, blood (routine x 2)     Status: Abnormal   Collection Time: 11/28/18  7:29 PM  Result Value Ref Range Status   Specimen Description BLOOD LEFT ARM  Final   Special Requests   Final    BOTTLES DRAWN AEROBIC AND ANAEROBIC Blood Culture adequate volume   Culture  Setup Time   Final    GRAM  POSITIVE COCCI IN BOTH AEROBIC AND ANAEROBIC BOTTLES CRITICAL VALUE NOTED.  VALUE IS CONSISTENT WITH PREVIOUSLY REPORTED AND CALLED VALUE.    Culture (A)  Final    STAPHYLOCOCCUS AUREUS SUSCEPTIBILITIES PERFORMED ON PREVIOUS CULTURE WITHIN THE LAST 5 DAYS. Performed at [facility], [place] [address] [place], [place] [address]    Report Status 12/01/2018 FINAL  Final  Culture, Urine     Status: None   Collection Time: 11/29/18 12:58 PM  Result Value Ref Range Status   Specimen Description URINE, CATHETERIZED  Final   Special Requests NONE  Final   Culture   Final    NO GROWTH Performed at [facility], [place] [address], [place], [place] [address]    Report Status 11/30/2018 FINAL  Final  Culture, blood (routine x 2)     Status: None   Collection Time: 11/30/18  3:22 PM  Result Value Ref Range Status   Specimen Description BLOOD LEFT ANTECUBITAL  Final   Special Requests   Final    BOTTLES DRAWN AEROBIC ONLY Blood Culture adequate volume   Culture   Final    NO GROWTH 5 DAYS Performed at [facility], [place] [address] [place], [place] [address]    Report Status 12/05/2018 FINAL  Final  Culture, blood (routine x 2)     Status: None   Collection Time: 11/30/18  3:22 PM  Result Value Ref Range Status   Specimen Description BLOOD RIGHT HAND  Final   Special Requests   Final    BOTTLES DRAWN AEROBIC ONLY Blood Culture adequate volume   Culture   Final    NO GROWTH 5 DAYS Performed at [facility], [place] [address] [place], [place] [address]    Report Status 12/05/2018 FINAL  Final  Aerobic/Anaerobic Culture (surgical/deep wound)     Status: None (Preliminary result)   Collection Time: 12/07/18 12:49 PM  Result Value Ref Range Status   Specimen Description ABSCESS  Final   Special Requests NONE  Final   Gram Stain   Final    RARE WBC PRESENT, PREDOMINANTLY MONONUCLEAR NO ORGANISMS SEEN    Culture   Final    RARE STAPHYLOCOCCUS AUREUS CULTURE REINCUBATED  FOR BETTER GROWTH Performed at [facility], [place] [address], [place], [place] [address]    Report Status PENDING  Incomplete  Radiology Studies: No results found.      Scheduled Meds: . colchicine  0.6 mg Oral BID  . docusate sodium  100 mg Oral BID  . heparin injection (subcutaneous)  5,000 Units Subcutaneous Q8H  . insulin aspart  0-15 Units Subcutaneous TID WC  . insulin aspart  0-5 Units Subcutaneous QHS  . insulin aspart  6 Units Subcutaneous TID WC  . insulin glargine  25 Units Subcutaneous BID  . multivitamin with minerals  1 tablet Oral Daily  . Ensure Max Protein  11 oz Oral Daily   Continuous Infusions: . sodium chloride 250 mL (11/30/18 0650)  . sodium chloride 10 mL/hr at 12/04/18 2231  . lactated ringers 10 mL/hr at 12/07/18 1121  . lactated ringers 50 mL/hr at 12/08/18 1047  . methocarbamol (ROBAXIN) IV    . vancomycin 1,000 mg (12/08/18 1049)     LOS: 10 days    Time spent: 41 minutes    Shiven Junious J [place] DO [facility] Pager [phone]  If 7PM-7AM, please contact night-coverage [email or web address] Password [email or web address] 12/08/2018, 2:47 PM

## 2018-12-08 NOTE — Anesthesia Postprocedure Evaluation (Signed)
Anesthesia Post Note  Patient: Yolanda [name]  Procedure(s) Performed: IRRIGATION AND DEBRIDEMENT LEFT WRIST (Left Wrist)     Patient location during evaluation: PACU Anesthesia Type: General Level of consciousness: sedated and patient cooperative Pain management: pain level controlled Vital Signs Assessment: post-procedure vital signs reviewed and stable Respiratory status: spontaneous breathing Cardiovascular status: stable Anesthetic complications: no    Last Vitals:  Vitals:   12/08/18 0821 12/08/18 1347  BP: (!) 152/90 (!) 165/88  Pulse: (!) 110 (!) 101  Resp:  20  Temp: 36.6 C 36.9 C  SpO2: 100% 100%    Last Pain:  Vitals:   12/08/18 1347  TempSrc: Oral  PainSc:                  Lewie Loron

## 2018-12-08 NOTE — Plan of Care (Signed)
  Problem: Metabolic: Goal: Ability to maintain appropriate glucose levels will improve Outcome: Not Progressing  Elevated BG. Antidiabetic agent given as ordered.

## 2018-12-09 ENCOUNTER — Encounter (HOSPITAL_COMMUNITY): Payer: Self-pay | Admitting: Orthopedic Surgery

## 2018-12-09 LAB — GLUCOSE, CAPILLARY
Glucose-Capillary: 200 mg/dL — ABNORMAL HIGH (ref 70–99)
Glucose-Capillary: 191 mg/dL — ABNORMAL HIGH (ref 70–99)
Glucose-Capillary: 142 mg/dL — ABNORMAL HIGH (ref 70–99)
Glucose-Capillary: 214 mg/dL — ABNORMAL HIGH (ref 70–99)
Glucose-Capillary: 137 mg/dL — ABNORMAL HIGH (ref 70–99)

## 2018-12-09 LAB — CBC
HCT: 22.7 % — ABNORMAL LOW (ref 36.0–46.0)
Hemoglobin: 7 g/dL — ABNORMAL LOW (ref 12.0–15.0)
MCH: 30.6 pg (ref 26.0–34.0)
MCHC: 30.8 g/dL (ref 30.0–36.0)
MCV: 99.1 fL (ref 80.0–100.0)
Platelets: 367 10*3/uL (ref 150–400)
RBC: 2.29 MIL/uL — ABNORMAL LOW (ref 3.87–5.11)
RDW: 18.9 % — ABNORMAL HIGH (ref 11.5–15.5)
WBC: 12 10*3/uL — ABNORMAL HIGH (ref 4.0–10.5)
nRBC: 0 % (ref 0.0–0.2)

## 2018-12-09 LAB — BASIC METABOLIC PANEL
Anion gap: 6 (ref 5–15)
BUN: 8 mg/dL (ref 6–20)
CO2: 27 mmol/L (ref 22–32)
Calcium: 7.7 mg/dL — ABNORMAL LOW (ref 8.9–10.3)
Chloride: 106 mmol/L (ref 98–111)
Creatinine, Ser: 0.51 mg/dL (ref 0.44–1.00)
GFR calc Af Amer: >60 mL/min (ref >60)
GFR calc non Af Amer: >60 mL/min (ref >60)
Glucose, Bld: 198 mg/dL — ABNORMAL HIGH (ref 70–99)
Potassium: 4 mmol/L (ref 3.5–5.1)
Sodium: 139 mmol/L (ref 135–145)

## 2018-12-09 LAB — PREPARE RBC (CROSSMATCH)

## 2018-12-09 LAB — VANCOMYCIN, PEAK: Vancomycin Pk: 32 ug/mL (ref 30–40)

## 2018-12-09 MED ORDER — INSULIN ASPART 100 UNIT/ML ~~LOC~~ SOLN
12.0000 [IU] | Freq: Three times a day (TID) | SUBCUTANEOUS | Status: DC
  Administered 2018-12-09 (×3): 12 [IU] via SUBCUTANEOUS

## 2018-12-09 MED ORDER — LOPERAMIDE HCL 2 MG PO CAPS
2.0000 mg | ORAL_CAPSULE | Freq: Four times a day (QID) | ORAL | Status: DC | PRN
  Administered 2018-12-09 – 2018-12-16 (×5): 2 mg via ORAL
  Filled 2018-12-09 (×6): qty 1

## 2018-12-09 MED ORDER — SODIUM CHLORIDE 0.9% IV SOLUTION
Freq: Once | INTRAVENOUS | Status: AC
  Administered 2018-12-09: 10:00:00 via INTRAVENOUS

## 2018-12-09 MED ORDER — INSULIN GLARGINE 100 UNIT/ML ~~LOC~~ SOLN
35.0000 [IU] | Freq: Two times a day (BID) | SUBCUTANEOUS | Status: DC
  Administered 2018-12-09 (×2): 35 [IU] via SUBCUTANEOUS
  Filled 2018-12-09 (×4): qty 0.35

## 2018-12-09 NOTE — Progress Notes (Signed)
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  On 5/20, patient presented at [facility] with complaint of worsening pain and swelling of her right foot for 1 week.  CT scanning was obtained which was concerning for destruction of the entire mid and hindfoot with fluid and gas collections, surrounding cellulitis. She was transferred to [facility] as a direct admit.  On presentation, patient was septic with a heart rate 80s, blood pressure in 80s and lactate elevated to 5.7. Infectious disease and orthopedic consult were obtained.  Assessment & Plan:   Principal Problem:   MRSA bacteremia Active Problems:   Diabetes mellitus type 2, uncontrolled, with complications ([place])   Charcot foot due to diabetes mellitus ([place])   Severe protein-calorie malnutrition ([place])   Diabetic polyneuropathy associated with type 2 diabetes mellitus ([place])   Cigarette smoker   Subacute osteomyelitis, right ankle and foot ([place])   Cutaneous abscess of right foot   Abscess of bursa, left wrist   Abscess of left hand   Sepsis, present on admission MRSA septicemia Diabetic right foot infection, osteomyelitis Patient presenting as a transfer from [facility] rocking him with worsening pain and swelling over right foot x1 week.  CT foot notable for destruction of the entire midfoot and hindfoot with fluid and gas collections and surrounding cellulitis.  ESR 136, CRP 22.1.  Lactic acid 1.4.  Blood Cultures from 020 2 out of 2 positive for MRSA. --Orthopedics, Dr. 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 --Infectious disease following, appreciate assistance --TEE on 5/22 with no indication of endocarditis --repeat blood cultures x 2 on 5/22 negative --ID recommends to complete 4 weeks of IV vancomycin, through 12/25/2018 --PICC line placed --Goal vancomycin trough 15-20; pharmacy for monitoring/dosing  Left wrist extending into forearm with and palmaris tendon excision 2/2 necrosis MRI notable for heterogeneous focal abnormality of the volar subcutaneous fat measuring 2.6 x 1.3 x 3 cm concerning for phlegmon versus developing abscess. --Status post irrigation and debridement by orthopedics on 12/07/2018 with findings of purulent abscess with extension from the wrist into the forearm and necrotic soft tissue/palmaris tendon status post excision. --Intraoperative culture with rare staph aureus, susceptibilities pending --Continue IV antibiotics with vancomycin as above --Nonweightbearing left wrist --Follow-up orthopedics outpatient 1 week postop  Type 2 diabetes mellitus Hemoglobin A1c 7.9.  Home regimen includes Humalog 75/25 30u BID and NovoLog 3 units 3 times daily AC. --Glucose continues in the upper 100s --Increase Lantus from 30 to 35 units [place] BID --Increase Novolog from 10u to 12u TIDAC --Moderate dose insulin sliding scale for further coverage --continue CBG's qAC/HS  Essential hypertension BP stable  Right lower extremity DVT Diagnosed in 07/2018, used to be on Xarelto, she completed the starter pack however Xarelto was discontinued due to bleeding issues at [facility] in 09/2018. Right lower extremity Dopplers on 3/29 did not show any DVT, so Xarelto was not resumed.  Hypercalcemia: resolved Likely due to sepsis, dehydration,  calcium 10.5  with a time of admission  Severe protein calorie malnutrition Albumin 1.2 --Placed on pro-stat, nutritional supplements --Encourage increased oral intake  Acute on chronic anemia, likely exacerbated by some postop blood loss anemia --H&H currently stable baseline 9-10 --Hemoglobin 7.4 at OSH, transfused 1 unit packed RBC,  --H&H 7.0 this morning. --We will transfuse 1 unit PRBC today --Continue to monitor hemoglobin and transfuse for less than 7.    DVT prophylaxis: Heparin Code Status: Full code Family Communication: none Disposition Plan: Pending SNF placement, case management for coordination   Consultants:   Orthopedics, Dr. Sharol Given  Infectious disease, Dr. Megan Salon  Procedures:   Transtibial amputation right lower extremity 11/30/2018  PICC line placement, RUE 5/26  Antimicrobials:   Vancomycin 11/28/2018>>>   Subjective: Patient seen and examined at bedside, resting comfortably in bed.  Request to be able to go outside with nursing staff.  No complaints this morning.  Hemoglobin noted to be 7.0, slightly down from yesterday.  Will transfuse 1 unit PRBC.  Denies headache, no fever/chills/night sweats, no nausea/vomiting/diarrhea, no chest pain, no palpitations, no abdominal pain.  No acute events overnight per nursing staff.  Objective: Vitals:   12/08/18 0821 12/08/18 1347 12/08/18 2202 12/09/18 0646  BP: (!) 152/90 (!) 165/88 127/79 (!) 152/91  Pulse: (!) 110 (!) 101 (!) 110 (!) 102  Resp:  '20 16 16  ' Temp: 97.8 F (36.6 C) 98.4 F (36.9 C) 98.5 F (36.9 C) 98 F (36.7 C)  TempSrc: Oral Oral Oral   SpO2: 100% 100% 99% 97%  Weight:      Height:        Intake/Output Summary (Last 24 hours) at 12/09/2018 1250 Last data filed at 12/08/2018 2345 Gross per 24 hour  Intake 200 ml  Output 700 ml  Net -500 ml   Filed Weights   12/06/18 0700 12/07/18 0628 12/08/18 0545  Weight: 83.1 kg 82.8 kg 80.3 kg    Examination:  General exam: Appears calm and  comfortable  Respiratory system: Clear to auscultation. Respiratory effort normal. Cardiovascular system: S1 & S2 heard, RRR. No JVD, murmurs, rubs, gallops or clicks. No pedal edema. Gastrointestinal system: Abdomen is nondistended, soft and nontender. No organomegaly or masses felt. Normal bowel sounds heard. Central nervous system: Alert and oriented. No focal neurological deficits. 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 Mood & affect appropriate.     Data Reviewed: I have personally reviewed following labs and imaging studies  CBC: Recent Labs  Lab 12/04/18 0259 12/06/18 0402 12/07/18 0422 12/08/18 0457 12/09/18 0423  WBC 22.0* 15.8* 13.6* 17.5* 12.0*  HGB 7.7* 7.3* 7.0* 7.5* 7.0*  HCT 23.7* 23.1* 22.3* 23.8* 22.7*  MCV 93.3 95.9 98.7 97.5 99.1  PLT 366 412* 377 414* 749   Basic Metabolic Panel: Recent Labs  Lab 12/04/18 0259 12/06/18 0402 12/07/18 0422 12/08/18 0457 12/09/18 0423  NA 139 139 139 135 139  K 3.4* 3.7 3.5 3.8 4.0  CL 110 108 110 101 106  CO2 '24 24 24 25 27  ' GLUCOSE 78 235* 150* 326* 198*  BUN '9 6 8 10 8  ' CREATININE 0.51 0.59 0.41* 0.50 0.51  CALCIUM 7.3* 6.6* 6.9* 7.2* 7.7*  MG  --  1.6* 1.5* 1.6*  --    GFR: Estimated Creatinine Clearance: 97.9 mL/min (by C-G formula based on SCr of 0.51 mg/dL). Liver Function Tests: No  results for input(s): AST, ALT, ALKPHOS, BILITOT, PROT, ALBUMIN in the last 168 hours. No results for input(s): LIPASE, AMYLASE in the last 168 hours. No results for input(s): AMMONIA in the last 168 hours. Coagulation Profile: No results for input(s): INR, PROTIME in the last 168 hours. Cardiac Enzymes: No results for input(s): CKTOTAL, CKMB, CKMBINDEX, TROPONINI in the last 168 hours. BNP (last 3 results) No results for input(s): PROBNP in the last 8760 hours. HbA1C: No results for  input(s): HGBA1C in the last 72 hours. CBG: Recent Labs  Lab 12/08/18 1238 12/08/18 1544 12/08/18 2205 12/09/18 0805 12/09/18 1237  GLUCAP 398* 270* 161* 200* 191*   Lipid Profile: No results for input(s): CHOL, HDL, LDLCALC, TRIG, CHOLHDL, LDLDIRECT in the last 72 hours. Thyroid Function Tests: No results for input(s): TSH, T4TOTAL, FREET4, T3FREE, THYROIDAB in the last 72 hours. Anemia Panel: No results for input(s): VITAMINB12, FOLATE, FERRITIN, TIBC, IRON, RETICCTPCT in the last 72 hours. Sepsis Labs: No results for input(s): PROCALCITON, LATICACIDVEN in the last 168 hours.  Recent Results (from the past 240 hour(s))  Culture, Urine     Status: None   Collection Time: 11/29/18 12:58 PM  Result Value Ref Range Status   Specimen Description URINE, CATHETERIZED  Final   Special Requests NONE  Final   Culture   Final    NO GROWTH Performed at [facility], [address], [place], [place] [address]    Report Status 11/30/2018 FINAL  Final  Culture, blood (routine x 2)     Status: None   Collection Time: 11/30/18  3:22 PM  Result Value Ref Range Status   Specimen Description BLOOD LEFT ANTECUBITAL  Final   Special Requests   Final    BOTTLES DRAWN AEROBIC ONLY Blood Culture adequate volume   Culture   Final    NO GROWTH 5 DAYS Performed at [facility], [place] [address], [place], [place] [address]    Report Status 12/05/2018 FINAL  Final  Culture, blood (routine x 2)     Status: None   Collection Time: 11/30/18  3:22 PM  Result Value Ref Range Status   Specimen Description BLOOD RIGHT HAND  Final   Special Requests   Final    BOTTLES DRAWN AEROBIC ONLY Blood Culture adequate volume   Culture   Final    NO GROWTH 5 DAYS Performed at [facility], [place] [address], [place], [place] [address]    Report Status 12/05/2018 FINAL  Final  Aerobic/Anaerobic Culture (surgical/deep wound)     Status: None (Preliminary result)   Collection Time: 12/07/18 12:49  PM  Result Value Ref Range Status   Specimen Description ABSCESS  Final   Special Requests NONE  Final   Gram Stain   Final    RARE WBC PRESENT, PREDOMINANTLY MONONUCLEAR NO ORGANISMS SEEN Performed at [facility], [place] [address] [place], [place] [address]    Culture   Final    RARE STAPHYLOCOCCUS AUREUS SUSCEPTIBILITIES TO FOLLOW NO ANAEROBES ISOLATED; CULTURE IN PROGRESS FOR 5 DAYS    Report Status PENDING  Incomplete         Radiology Studies: No results found.      Scheduled Meds: . colchicine  0.6 mg Oral BID  . docusate sodium  100 mg Oral BID  . heparin injection (subcutaneous)  5,000 Units Subcutaneous Q8H  . insulin aspart  0-15 Units Subcutaneous TID WC  . insulin aspart  0-5 Units Subcutaneous  QHS  . insulin aspart  12 Units Subcutaneous TID WC  . insulin glargine  35 Units Subcutaneous BID  . multivitamin with minerals  1 tablet Oral Daily  . Ensure Max Protein  11 oz Oral Daily   Continuous Infusions: . sodium chloride 250 mL (11/30/18 0650)  . sodium chloride 10 mL/hr at 12/04/18 2231  . lactated ringers 10 mL/hr at 12/07/18 1121  . lactated ringers 50 mL/hr at 12/08/18 1047  . methocarbamol (ROBAXIN) IV    . vancomycin 1,000 mg (12/09/18 1019)     LOS: 11 days    Time spent: 4 minutes    Emiliano Welshans J [place] DO [facility] Pager [phone]  If 7PM-7AM, please contact night-coverage [email or web address] Password [email or web address] 12/09/2018, 12:50 PM

## 2018-12-09 NOTE — Progress Notes (Signed)
Received pt at 1730. Awaiting typing screen and blood bank to notify when blood is ready. Will report off to night shift nurse. Pt stable will continue to monitor.

## 2018-12-09 NOTE — Progress Notes (Addendum)
Messaged [facility] per Hg 7.0  Waiting to receive reply if I should call Dr. Lajoyce Corners with result.

## 2018-12-10 LAB — BPAM RBC
Blood Product Expiration Date: 202006082359
ISSUE DATE / TIME: 202005312008
Unit Type and Rh: 6200

## 2018-12-10 LAB — CBC
HCT: 25.5 % — ABNORMAL LOW (ref 36.0–46.0)
Hemoglobin: 8.2 g/dL — ABNORMAL LOW (ref 12.0–15.0)
MCH: 31.1 pg (ref 26.0–34.0)
MCHC: 32.2 g/dL (ref 30.0–36.0)
MCV: 96.6 fL (ref 80.0–100.0)
Platelets: 362 10*3/uL (ref 150–400)
RBC: 2.64 MIL/uL — ABNORMAL LOW (ref 3.87–5.11)
RDW: 18.7 % — ABNORMAL HIGH (ref 11.5–15.5)
WBC: 10.9 10*3/uL — ABNORMAL HIGH (ref 4.0–10.5)
nRBC: 0.2 % (ref 0.0–0.2)

## 2018-12-10 LAB — BASIC METABOLIC PANEL
Anion gap: 5 (ref 5–15)
BUN: 10 mg/dL (ref 6–20)
CO2: 29 mmol/L (ref 22–32)
Calcium: 8.2 mg/dL — ABNORMAL LOW (ref 8.9–10.3)
Chloride: 104 mmol/L (ref 98–111)
Creatinine, Ser: 0.48 mg/dL (ref 0.44–1.00)
GFR calc Af Amer: >60 mL/min (ref >60)
GFR calc non Af Amer: >60 mL/min (ref >60)
Glucose, Bld: 214 mg/dL — ABNORMAL HIGH (ref 70–99)
Potassium: 3.8 mmol/L (ref 3.5–5.1)
Sodium: 138 mmol/L (ref 135–145)

## 2018-12-10 LAB — TYPE AND SCREEN
ABO/RH(D): A POS
Antibody Screen: NEGATIVE
Unit division: 0

## 2018-12-10 LAB — GLUCOSE, CAPILLARY
Glucose-Capillary: 136 mg/dL — ABNORMAL HIGH (ref 70–99)
Glucose-Capillary: 237 mg/dL — ABNORMAL HIGH (ref 70–99)
Glucose-Capillary: 158 mg/dL — ABNORMAL HIGH (ref 70–99)
Glucose-Capillary: 79 mg/dL (ref 70–99)

## 2018-12-10 LAB — VANCOMYCIN, TROUGH: Vancomycin Tr: 12 ug/mL — ABNORMAL LOW (ref 15–20)

## 2018-12-10 MED ORDER — VANCOMYCIN HCL IN DEXTROSE 750-5 MG/150ML-% IV SOLN
750.0000 mg | Freq: Two times a day (BID) | INTRAVENOUS | Status: DC
  Administered 2018-12-10 – 2018-12-25 (×31): 750 mg via INTRAVENOUS
  Filled 2018-12-10 (×32): qty 150

## 2018-12-10 MED ORDER — INSULIN ASPART 100 UNIT/ML ~~LOC~~ SOLN
15.0000 [IU] | Freq: Three times a day (TID) | SUBCUTANEOUS | Status: DC
  Administered 2018-12-10 – 2018-12-12 (×6): 15 [IU] via SUBCUTANEOUS

## 2018-12-10 MED ORDER — INSULIN GLARGINE 100 UNIT/ML ~~LOC~~ SOLN
38.0000 [IU] | Freq: Two times a day (BID) | SUBCUTANEOUS | Status: DC
  Administered 2018-12-10 – 2018-12-12 (×4): 38 [IU] via SUBCUTANEOUS
  Filled 2018-12-10 (×6): qty 0.38

## 2018-12-10 NOTE — Progress Notes (Signed)
Pharmacy Antibiotic Note  Yolanda [name] is a 43 y.o. female admitted on 11/28/2018 with diabetic foot infection.  Pharmacy has been consulted for vancomycin dosing. 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 Per MD note, she failed daptomycin and was transitioned to vancomycin (which was supposed to complete on 11/20/2018). 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     AUC on 1gm q12 is 584.2.  Will adjust dose to 750 mg IV q12 for AUC 437.9   Plan: Decrease vancomycin to 750 mg IV q12h Continue treatment through 12/25/18 (OPAT orders entered)  Height: 5\' 6"  (167.6 cm) Weight: 177 lb 0.5 oz (80.3 kg) IBW/kg (Calculated) : 59.3  Temp (24hrs), Avg:98.5 F (36.9 C), Min:97.7 F (36.5 C), Max:99.1 F (37.3 C)  Recent Labs  Lab 12/05/18 0934 12/05/18 1253 12/06/18 0402 12/07/18 0422 12/08/18 0457 12/09/18 0423 12/09/18 1315 12/10/18 0008 12/10/18 0021  WBC  --   --  15.8* 13.6* 17.5* 12.0*  --   --  10.9*  CREATININE  --   --  0.59 0.41* 0.50 0.51  --   --  0.48  VANCOTROUGH 18  --   --   --   --   --   --  12*  --   VANCOPEAK  --   --   --   --   --   --  32  --   --   VANCORANDOM  --  38  --   --   --   --   --   --   --     Estimated Creatinine Clearance: 97.9 mL/min (by C-G formula based on SCr of 0.48 mg/dL).    No Known Allergies   Thanks for allowing pharmacy to be a part of this patient's care.  Talbert Cage, PharmD Clinical Pharmacist

## 2018-12-10 NOTE — Progress Notes (Signed)
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  On 5/20, patient presented at [facility] with complaint of worsening pain and swelling of her right foot for 1 week.  CT scanning was obtained which was concerning for destruction of the entire mid and hindfoot with fluid and gas collections, surrounding cellulitis. She was transferred to [facility] as a direct admit.  On presentation, patient was septic with a heart rate 80s, blood pressure in 80s and lactate elevated to 5.7. Infectious disease and orthopedic consult were obtained.  Assessment & Plan:   Principal Problem:   MRSA bacteremia Active Problems:   Diabetes mellitus type 2, uncontrolled, with complications ([place])   Charcot foot due to diabetes mellitus ([place])   Severe protein-calorie malnutrition ([place])   Diabetic polyneuropathy associated with type 2 diabetes mellitus ([place])   Cigarette smoker   Subacute osteomyelitis, right ankle and foot ([place])   Cutaneous abscess of right foot   Abscess of bursa, left wrist   Abscess of left hand   Sepsis, present on admission MRSA septicemia Diabetic right foot infection, osteomyelitis Patient presenting as a transfer from [facility] rocking him with worsening pain and swelling over right foot x1 week.  CT foot notable for destruction of the entire midfoot and hindfoot with fluid and gas collections and surrounding cellulitis.  ESR 136, CRP 22.1.  Lactic acid 1.4.  Blood Cultures from 020 2 out of 2 positive for MRSA. --Orthopedics, Dr. 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 --Infectious disease following, appreciate assistance --TEE on 5/22 with no indication of endocarditis --repeat blood cultures x 2 on 5/22 negative --ID recommends to complete 4 weeks of IV vancomycin, through 12/25/2018 --PICC line placed --Goal vancomycin trough 15-20; pharmacy for monitoring/dosing  Left wrist extending into forearm with and palmaris tendon excision 2/2 necrosis MRI notable for heterogeneous focal abnormality of the volar subcutaneous fat measuring 2.6 x 1.3 x 3 cm concerning for phlegmon versus developing abscess. --Status post irrigation and debridement by orthopedics on 12/07/2018 with findings of purulent abscess with extension from the wrist into the forearm and necrotic soft tissue/palmaris tendon status post excision. --Intraoperative culture with rare staph aureus, susceptibilities pending --Continue IV antibiotics with vancomycin as above --Nonweightbearing left wrist --Follow-up orthopedics outpatient 1 week postop  Type 2 diabetes mellitus Hemoglobin A1c 7.9.  Home regimen includes Humalog 75/25 30u BID and NovoLog 3 units TIDAC --Glucose continues in the upper 100s --Increase Lantus from 35 to 38 units [place] BID --Increase Novolog from 12u to 15u TIDAC --Moderate dose insulin sliding scale for further coverage --continue CBG's qAC/HS  Essential hypertension BP stable  Right lower extremity DVT Diagnosed in 07/2018, used to be on Xarelto, she completed the starter pack however Xarelto was discontinued due to bleeding issues at [facility] in 09/2018. Right lower extremity Dopplers on 3/29 did not show any DVT, so Xarelto was not resumed.  Hypercalcemia: resolved Likely due to sepsis, dehydration, calcium 10.5 with  a time  of admission  Severe protein calorie malnutrition Albumin 1.2 --Placed on pro-stat, nutritional supplements --Encourage increased oral intake  Acute on chronic anemia, likely exacerbated by some postop blood loss anemia --H&H currently stable baseline 9-10 --Hemoglobin 7.4 at OSH, transfused 1 unit packed RBC,  --H&H 7.0-->8.2 following 1u pRBC on 6/1 --Continue to monitor hemoglobin and transfuse for less than 7.   DVT prophylaxis: Heparin Code Status: Full code Family Communication: none Disposition Plan: Pending SNF placement, case management for coordination   Consultants:   Orthopedics, Dr. Sharol Given  Infectious disease, Dr. Megan Salon  Procedures:   Transtibial amputation right lower extremity 11/30/2018  PICC line placement, RUE 5/26  Antimicrobials:   Vancomycin 11/28/2018>>>   Subjective: Patient seen and examined at bedside, resting comfortably in bed. No complaints this morning.   Denies headache, no fever/chills/night sweats, no nausea/vomiting/diarrhea, no chest pain, no palpitations, no abdominal pain.  No acute events overnight per nursing staff.  Objective: Vitals:   12/09/18 2032 12/09/18 2214 12/10/18 0500 12/10/18 0620  BP: (!) 165/93 (!) 171/91  (!) 138/98  Pulse: (!) 112 (!) 103  97  Resp: _0 Temp: 98.4 F (36.9 C) 99.1 F (37.3 C)  98 F (36.7 C)  TempSrc: Oral Oral    SpO2: 99% 100%  100%  Weight:   81.1 kg   Height:        Intake/Output Summary (Last 24 hours) at 12/10/2018 1447 Last data filed at 12/10/2018 0146 Gross per 24 hour  Intake 1462.37 ml  Output 800 ml  Net 662.37 ml   Filed Weights   12/07/18 0628 12/08/18 0545 12/10/18 0500  Weight: 82.8 kg 80.3 kg 81.1 kg    Examination:  General exam: Appears calm and comfortable  Respiratory system: Clear to auscultation. Respiratory effort normal. Cardiovascular system: S1 & S2 heard, RRR. No JVD, murmurs, rubs, gallops or clicks. No pedal edema. Gastrointestinal system:  Abdomen is nondistended, soft and nontender. No organomegaly or masses felt. Normal bowel sounds heard. Central nervous system: Alert and oriented. No focal neurological deficits. 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 Mood & affect appropriate.     Data Reviewed: I have personally reviewed following labs and imaging studies  CBC: Recent Labs  Lab 12/06/18 0402 12/07/18 0422 12/08/18 0457 12/09/18 0423 12/10/18 0021  WBC 15.8* 13.6* 17.5* 12.0* 10.9*  HGB 7.3* 7.0* 7.5* 7.0* 8.2*  HCT 23.1* 22.3* 23.8* 22.7* 25.5*  MCV 95.9 98.7 97.5 99.1 96.6  PLT 412* 377 414* 367 275   Basic Metabolic Panel: Recent Labs  Lab 12/06/18 0402 12/07/18 0422 12/08/18 0457 12/09/18 0423 12/10/18 0021  NA 139 139 135 139 138  K 3.7 3.5 3.8 4.0 3.8  CL 108 110 101 106 104  CO2 _1 GLUCOSE 235* 150* 326* 198* 214*  BUN _2 CREATININE 0.59 0.41* 0.50 0.51 0.48  CALCIUM 6.6* 6.9* 7.2* 7.7* 8.2*  MG 1.6* 1.5* 1.6*  --   --    GFR: Estimated Creatinine Clearance: 98.3 mL/min (by C-G formula based on SCr of 0.48 mg/dL). Liver Function Tests: No results for input(s): AST, ALT, ALKPHOS, BILITOT, PROT, ALBUMIN in the last 168 hours. No results for input(s): LIPASE, AMYLASE in the last 168 hours. No results for input(s): AMMONIA in the last 168 hours. Coagulation Profile: No results  for input(s): INR, PROTIME in the last 168 hours. Cardiac Enzymes: No results for input(s): CKTOTAL, CKMB, CKMBINDEX, TROPONINI in the last 168 hours. BNP (last 3 results) No results for input(s): PROBNP in the last 8760 hours. HbA1C: No results for input(s): HGBA1C in the last 72 hours. CBG: Recent Labs  Lab 12/09/18 1620 12/09/18 1808 12/09/18 2017 12/10/18 0845 12/10/18 1213  GLUCAP 142* 214* 137* 136* 237*   Lipid Profile: No results for  input(s): CHOL, HDL, LDLCALC, TRIG, CHOLHDL, LDLDIRECT in the last 72 hours. Thyroid Function Tests: No results for input(s): TSH, T4TOTAL, FREET4, T3FREE, THYROIDAB in the last 72 hours. Anemia Panel: No results for input(s): VITAMINB12, FOLATE, FERRITIN, TIBC, IRON, RETICCTPCT in the last 72 hours. Sepsis Labs: No results for input(s): PROCALCITON, LATICACIDVEN in the last 168 hours.  Recent Results (from the past 240 hour(s))  Culture, blood (routine x 2)     Status: None   Collection Time: 11/30/18  3:22 PM  Result Value Ref Range Status   Specimen Description BLOOD LEFT ANTECUBITAL  Final   Special Requests   Final    BOTTLES DRAWN AEROBIC ONLY Blood Culture adequate volume   Culture   Final    NO GROWTH 5 DAYS Performed at [facility], [address], [place], [place] [address]    Report Status 12/05/2018 FINAL  Final  Culture, blood (routine x 2)     Status: None   Collection Time: 11/30/18  3:22 PM  Result Value Ref Range Status   Specimen Description BLOOD RIGHT HAND  Final   Special Requests   Final    BOTTLES DRAWN AEROBIC ONLY Blood Culture adequate volume   Culture   Final    NO GROWTH 5 DAYS Performed at [facility], [place] [address], [place], [place] [address]    Report Status 12/05/2018 FINAL  Final  Aerobic/Anaerobic Culture (surgical/deep wound)     Status: None (Preliminary result)   Collection Time: 12/07/18 12:49 PM  Result Value Ref Range Status   Specimen Description ABSCESS  Final   Special Requests NONE  Final   Gram Stain   Final    RARE WBC PRESENT, PREDOMINANTLY MONONUCLEAR NO ORGANISMS SEEN Performed at [facility], [place] [address] [place], [place] [address]    Culture   Final    RARE METHICILLIN RESISTANT STAPHYLOCOCCUS AUREUS NO ANAEROBES ISOLATED; CULTURE IN PROGRESS FOR 5 DAYS    Report Status PENDING  Incomplete   Organism ID, Bacteria METHICILLIN RESISTANT STAPHYLOCOCCUS AUREUS  Final      Susceptibility    Methicillin resistant staphylococcus aureus - MIC*    CIPROFLOXACIN >=8 RESISTANT Resistant     ERYTHROMYCIN >=8 RESISTANT Resistant     GENTAMICIN <=0.5 SENSITIVE Sensitive     OXACILLIN >=4 RESISTANT Resistant     TETRACYCLINE <=1 SENSITIVE Sensitive     VANCOMYCIN 1 SENSITIVE Sensitive     TRIMETH/SULFA <=10 SENSITIVE Sensitive     CLINDAMYCIN >=8 RESISTANT Resistant     RIFAMPIN <=0.5 SENSITIVE Sensitive     Inducible Clindamycin NEGATIVE Sensitive     * RARE METHICILLIN RESISTANT STAPHYLOCOCCUS AUREUS         Radiology Studies: No results found.      Scheduled Meds: . docusate sodium  100 mg Oral BID  . heparin injection (subcutaneous)  5,000 Units Subcutaneous Q8H  . insulin aspart  0-15 Units Subcutaneous TID WC  . insulin aspart  0-5 Units Subcutaneous QHS  . insulin  aspart  15 Units Subcutaneous TID WC  . insulin glargine  38 Units Subcutaneous BID  . multivitamin with minerals  1 tablet Oral Daily  . Ensure Max Protein  11 oz Oral Daily   Continuous Infusions: . sodium chloride 250 mL (11/30/18 0650)  . sodium chloride 10 mL/hr at 12/04/18 2231  . lactated ringers 10 mL/hr at 12/07/18 1121  . vancomycin 750 mg (12/10/18 0953)     LOS: 12 days    Time spent: 36 minutes     J [place] DO [facility] Pager [phone]  If 7PM-7AM, please contact night-coverage [email or web address] Password [facility] 12/10/2018, 2:47 PM

## 2018-12-10 NOTE — Progress Notes (Signed)
Subjective: 3 Days Post-Op Procedure(s) (LRB): IRRIGATION AND DEBRIDEMENT LEFT WRIST (Left) Patient reports pain as moderate.    Objective: Vital signs in last 24 hours: Temp:  [97.7 F (36.5 C)-99.1 F (37.3 C)] 98 F (36.7 C) (06/01 0620) Pulse Rate:  [97-115] 97 (06/01 0620) Resp:  [18-20] 18 (06/01 0620) BP: (138-178)/(73-99) 138/98 (06/01 0620) SpO2:  [98 %-100 %] 100 % (06/01 0620) Weight:  [81.1 kg] 81.1 kg (06/01 0500)  Intake/Output from previous day: 05/31 0701 - 06/01 0700 In: 1462.4 [Blood:275; IV Piggyback:1187.4] Out: 800 [Urine:800] Intake/Output this shift: No intake/output data recorded.  Recent Labs    12/08/18 0457 12/09/18 0423 12/10/18 0021  HGB 7.5* 7.0* 8.2*   Recent Labs    12/09/18 0423 12/10/18 0021  WBC 12.0* 10.9*  RBC 2.29* 2.64*  HCT 22.7* 25.5*  PLT 367 362   Recent Labs    12/09/18 0423 12/10/18 0021  NA 139 138  K 4.0 3.8  CL 106 104  CO2 27 29  BUN 8 10  CREATININE 0.51 0.48  GLUCOSE 198* 214*  CALCIUM 7.7* 8.2*   No results for input(s): LABPT, INR in the last 72 hours.  VAC dressing in place over the right transtibial amputation site and functioning well. VAC canister with no drainage. Left wrist dressing in place and without drainage on dressings. NV intact distally.    Assessment/Plan: 3 Days Post-Op Procedure(s) (LRB): IRRIGATION AND DEBRIDEMENT LEFT WRIST (Left) Continue VAC to right transtibial amputation site ID- MRSA- continues Vancomycin- ID following Planning SNF placement at [place]  [facility], PA-C 12/10/2018, 7:49 AM [facility] [phone]

## 2018-12-10 NOTE — Progress Notes (Signed)
Physical Therapy Treatment Patient Details Name: Yolanda [name] MRN: [id number] DOB: 24-Sep-1975 Today's Date: 12/10/2018    History of Present Illness Pt is a 43 y.o. F with significant PMH of diabetes type 2, diabetic foot ulcer, who presents with sepsis with right foot infection, osteomyelitis and MRSA bacteremia and presumed endocarditis. 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 Pt now with L hand swelling and pain and awaiting MRI on hand to see if any infectious process. X ray was negative for fracture.     PT Comments    Patient seen for mobility progression. Pt is pleasant and agreeable to participate in therapy. Pt requires max A +2 for functional transfer training EOB to BSC and BSC to drop arm recliner. Pt requires max cues to maintain L wrist NWB throughout session. Given pt's mobility level and cognitive deficits continue to recommend SNF for further skilled PT services to maximize independence and safety with mobility.     Follow Up Recommendations  SNF;Supervision/Assistance - 24 hour     Equipment Recommendations  Other (comment)(TBD next venue)    Recommendations for Other Services       Precautions / Restrictions Precautions Precautions: Fall Precaution Comments: R BKA, wound vac Other Brace: R residual limb guard Restrictions Weight Bearing Restrictions: Yes RLE Weight Bearing: Non weight bearing Other Position/Activity Restrictions: NWB L wrist per orthopedic note    Mobility  Bed Mobility Overal bed mobility: Needs Assistance Bed Mobility: Supine to Sit     Supine to sit: Min guard     General bed mobility comments: cues for maintaining NWB L wrist; minguard for safety, increased time and effort with HOB elevated, pt able to perform without physical assist   Transfers Overall transfer level: Needs assistance Equipment used: 2 person hand held assist Transfers: Sit to/from [employer] Transfers Sit to Stand: Max assist;+2 physical assistance;+2  safety/equipment Stand pivot transfers: Max assist;+2 physical assistance;+2 safety/equipment       General transfer comment: +2 face to face method for sit<>stand and stand pivot transfer, pt able to initiate pushing up with LLE, cues not to push throgh L hand with transitions; transfer EOB>BSC and BSC>drop arm recliner; requires boosting and steadying assist throughout, pt with difficulty fully extending through LLE  Ambulation/Gait                 Stairs             Wheelchair Mobility    Modified Rankin (Stroke Patients Only)       Balance Overall balance assessment: Needs assistance Sitting-balance support: Bilateral upper extremity supported;Feet supported Sitting balance-[name]     Standing balance support: Bilateral upper extremity supported;During functional activity Standing balance-Leahy Scale: Poor                              Cognition Arousal/Alertness: Awake/alert Behavior During Therapy: WFL for tasks assessed/performed Overall Cognitive Status: Impaired/Different from baseline Area of Impairment: Problem solving;Safety/judgement;Awareness;Following commands                       Following Commands: Follows one step commands with increased time Safety/Judgement: Decreased awareness of safety;Decreased awareness of deficits Awareness: Emergent Problem Solving: Requires verbal cues;Difficulty sequencing;Requires tactile cues General Comments: pt with decreased awareness of deficits and safety requiring cues, slightly impulsive      Exercises Hand Exercises Digit Composite Flexion: AROM;10 reps;Left Composite Extension: AROM;Left;10 reps  General Comments        Pertinent Vitals/Pain Pain Assessment: Faces Faces Pain Scale: Hurts little more Pain Location: L hand  Pain Descriptors / Indicators: Discomfort Pain Intervention(s): Monitored during session;Repositioned    Home Living                       Prior Function            PT Goals (current goals can now be found in the care plan section) Acute Rehab PT Goals Patient Stated Goal: none stated today, happy to get up with therapy Progress towards PT goals: Progressing toward goals    Frequency    Min 3X/week      PT Plan Current plan remains appropriate    Co-evaluation PT/OT/SLP Co-Evaluation/Treatment: Yes Reason for Co-Treatment: For patient/therapist safety;Necessary to address cognition/behavior during functional activity;To address functional/ADL transfers PT goals addressed during session: Mobility/safety with mobility;Balance OT goals addressed during session: ADL's and self-care      AM-PAC PT "6 Clicks" Mobility   Outcome Measure  Help needed turning from your back to your side while in a flat bed without using bedrails?: A Little Help needed moving from lying on your back to sitting on the side of a flat bed without using bedrails?: A Little Help needed moving to and from a bed to a chair (including a wheelchair)?: A Lot Help needed standing up from a chair using your arms (e.g., wheelchair or bedside chair)?: A Lot Help needed to walk in hospital room?: A Lot Help needed climbing 3-5 steps with a railing? : Total 6 Click Score: 13    End of Session Equipment Utilized During Treatment: Gait belt Activity Tolerance: Patient tolerated treatment well Patient left: in chair;with call bell/phone within reach;with chair alarm set Nurse Communication: Mobility status PT Visit Diagnosis: Other abnormalities of gait and mobility (R26.89);Pain Pain - Right/Left: Left Pain - part of body: Hand     Time: [id number] PT Time Calculation (min) (ACUTE ONLY): 30 min  Charges:  $Gait Training: 8-22 mins                     [name], PTA Acute Rehabilitation Services Pager: [phone] Office: [phone]     [name] 12/10/2018, 5:12 PM

## 2018-12-10 NOTE — Progress Notes (Signed)
Occupational Therapy Treatment Patient Details Name: Yolanda [name] MRN: [id number] DOB: Apr 29, 1976 Today's Date: 12/10/2018    History of present illness Pt is a 43 y.o. F with significant PMH of diabetes type 2, diabetic foot ulcer, who presents with sepsis with right foot infection, osteomyelitis and MRSA bacteremia and presumed endocarditis. 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 Pt now with L hand swelling and pain and awaiting MRI on hand to see if any infectious process. X ray was negative for fracture.    OT comments  Pt presents supine in bed agreeable to working with therapies. [facility]. Pt requiring maxA+2 for stand pivot transfers today; able to perform toileting ADL voiding bladder with overall modA and performing peri-care via lateral lean. Pt requiring frequent cues for safety as pt slightly impulsive and attempting to stand and move without assist. Feel SNF recommendation remains appropriate at this time. Will continue to follow acutely.   Follow Up Recommendations  SNF;Supervision/Assistance - 24 hour    Equipment Recommendations  3 in 1 bedside commode          Precautions / Restrictions Precautions Precautions: Fall Precaution Comments: R BKA, wound vac Other Brace: R residual limb guard Restrictions Weight Bearing Restrictions: Yes RLE Weight Bearing: Non weight bearing Other Position/Activity Restrictions: maintained NWB in L hand during session       Mobility Bed Mobility Overal bed mobility: Needs Assistance Bed Mobility: Supine to Sit     Supine to sit: Min guard     General bed mobility comments: minguard for safety, increased time and effort with HOB elevated, pt able to perform without physical assist   Transfers Overall transfer level: Needs assistance Equipment used: 2 person hand held assist Transfers: Sit to/from BJ'sStand;Stand Pivot Transfers Sit to Stand: Max assist;+2 physical assistance;+2  safety/equipment Stand pivot transfers: Max assist;+2 physical assistance;+2 safety/equipment       General transfer comment: +2 face to face method for sit<>stand and stand pivot transfer, pt able to initiate pushing up with LLE, cues not to push throgh L hand with transitions; transfer EOB>BSC and BSC>drop arm recliner; requires boosting and steadying assist throughout, pt with difficulty fully extending through LLE    Balance Overall balance assessment: Needs assistance Sitting-balance support: Bilateral upper extremity supported;Feet supported Sitting balance-[name]     Standing balance support: Bilateral upper extremity supported;During functional activity Standing balance-Leahy Scale: Poor                             ADL either performed or assessed with clinical judgement   ADL Overall ADL's : Needs assistance/impaired Eating/Feeding: Modified independent;Sitting Eating/Feeding Details (indicate cue type and reason): pt setup for dinner end of session                     Toilet Transfer: Maximal assistance;+2 for physical assistance;+2 for safety/equipment;Stand-pivot;BSC Toilet Transfer Details (indicate cue type and reason): face to face with +2 HHA Toileting- Clothing Manipulation and Hygiene: Sitting/lateral lean;Maximal assistance;+2 for physical assistance;+2 for safety/equipment;Sit to/from stand Toileting - Clothing Manipulation Details (indicate cue type and reason): pt able to perform pericare via lateral leans seated on BSC after voiding bladder      Functional mobility during ADLs: Maximal assistance;+2 for physical assistance;+2 for safety/equipment(stand pivot transfer) General ADL Comments: pt limited by decreased strength, balance, coordination and cognition     Vision  Perception     Praxis      Cognition Arousal/Alertness: Awake/alert Behavior During Therapy: WFL for tasks assessed/performed Overall Cognitive  Status: Impaired/Different from baseline Area of Impairment: Problem solving;Safety/judgement;Awareness;Following commands                       Following Commands: Follows one step commands with increased time Safety/Judgement: Decreased awareness of safety;Decreased awareness of deficits Awareness: Emergent Problem Solving: Requires verbal cues;Difficulty sequencing;Requires tactile cues General Comments: pt with decreased awareness of deficits and safety requiring cues, slightly impulsive        Exercises Exercises: Hand exercises Hand Exercises Digit Composite Flexion: AROM;10 reps;Left Composite Extension: AROM;Left;10 reps   Shoulder Instructions       General Comments      Pertinent Vitals/ Pain       Pain Assessment: Faces Faces Pain Scale: Hurts little more Pain Location: L hand  Pain Descriptors / Indicators: Discomfort Pain Intervention(s): Monitored during session;Repositioned  Home Living                                          Prior Functioning/Environment              Frequency  Min 2X/week        Progress Toward Goals  OT Goals(current goals can now be found in the care plan section)  Progress towards OT goals: Progressing toward goals  Acute Rehab OT Goals Patient Stated Goal: none stated today, happy to get up with therapy OT Goal Formulation: With patient Time For Goal Achievement: 12/16/18 Potential to Achieve Goals: Good ADL Goals Pt Will Perform Lower Body Bathing: with min assist;sitting/lateral leans;with adaptive equipment Pt Will Perform Lower Body Dressing: with min assist;sitting/lateral leans;sit to/from stand;with adaptive equipment Pt Will Transfer to Toilet: with min assist;bedside commode;squat pivot transfer Pt Will Perform Toileting - Clothing Manipulation and hygiene: with min assist;sitting/lateral leans;sit to/from stand;with adaptive equipment Pt/caregiver will Perform Home Exercise  Program: Increased strength;Increased ROM;Both right and left upper extremity;With written HEP provided  Plan Discharge plan remains appropriate;Frequency remains appropriate    Co-evaluation    PT/OT/SLP Co-Evaluation/Treatment: Yes Reason for Co-Treatment: For patient/therapist safety;To address functional/ADL transfers;Necessary to address cognition/behavior during functional activity   OT goals addressed during session: ADL's and self-care      AM-PAC OT "6 Clicks" Daily Activity     Outcome Measure   Help from another person eating meals?: None Help from another person taking care of personal grooming?: None Help from another person toileting, which includes using toliet, bedpan, or urinal?: A Lot Help from another person bathing (including washing, rinsing, drying)?: A Lot Help from another person to put on and taking off regular upper body clothing?: A Little Help from another person to put on and taking off regular lower body clothing?: A Lot 6 Click Score: 17    End of Session Equipment Utilized During Treatment: Gait belt  OT Visit Diagnosis: Unsteadiness on feet (R26.81);Muscle weakness (generalized) (M62.81)   Activity Tolerance Patient tolerated treatment well   Patient Left with call bell/phone within reach;in chair;with chair alarm set   Nurse Communication Mobility status        Time: 4462-8638 OT Time Calculation (min): 30 min  Charges: OT General Charges $OT Visit: 1 Visit OT Treatments $Self Care/Home Management : 8-22 mins  Marcy Siren, OT Supplemental Rehabilitation Services Pager [phone]  Office [phone]    Orlando Penner 12/10/2018, 5:04 PM

## 2018-12-10 NOTE — TOC Progression Note (Signed)
Transition of Care [facility]) - Progression Note    Patient Details  Name: Yolanda [name] MRN: [id number] Date of Birth: 28-Aug-1975  Transition of [facility]) CM/SW Contact  Mearl Latin, LCSW Phone Number: 12/10/2018, 10:23 AM  Clinical Narrative:    [name] is checking [facility] Medicaid bed availability and will let CSW know.    Expected Discharge Plan: Skilled Nursing Facility Barriers to Discharge: Continued Medical Work up  Expected Discharge Plan and Services Expected Discharge Plan: Skilled Nursing Facility In-house Referral: Clinical Social Work Discharge Planning Services: NA Post Acute Care Choice: Skilled Nursing Facility Living arrangements for the past 2 months: Single Family Home                 DME Arranged: N/A DME Agency: NA       HH Arranged: NA HH Agency: NA         Social Determinants of Health (SDOH) Interventions    Readmission Risk Interventions No flowsheet data found.

## 2018-12-11 LAB — GLUCOSE, CAPILLARY
Glucose-Capillary: 276 mg/dL — ABNORMAL HIGH (ref 70–99)
Glucose-Capillary: 153 mg/dL — ABNORMAL HIGH (ref 70–99)
Glucose-Capillary: 110 mg/dL — ABNORMAL HIGH (ref 70–99)
Glucose-Capillary: 199 mg/dL — ABNORMAL HIGH (ref 70–99)

## 2018-12-11 NOTE — Progress Notes (Signed)
Pts blood sugar 110 Dr [place] notified, and RN instructed to hold  1700 15 units of insulin with meals, will report off to night shift rn

## 2018-12-11 NOTE — TOC Progression Note (Addendum)
Transition of Care [facility]) - Progression Note    Patient Details  Name: Yolanda [name] MRN: [id number] Date of Birth: 1976-04-10  Transition of Care [facility]) CM/SW Contact  Mearl Latin, LCSW Phone Number: 12/11/2018, 1:03 PM  Clinical Narrative:    [name] now [facility] not have any beds available. Referral under review at [facility] and [facility]. CSW updated patient and her father.    Expected Discharge Plan: Skilled Nursing Facility Barriers to Discharge: Continued Medical Work up  Expected Discharge Plan and Services Expected Discharge Plan: Skilled Nursing Facility In-house Referral: Clinical Social Work Discharge Planning Services: NA Post Acute Care Choice: Skilled Nursing Facility Living arrangements for the past 2 months: Single Family Home                 DME Arranged: N/A DME Agency: NA       HH Arranged: NA HH Agency: NA         Social Determinants of Health (SDOH) Interventions    Readmission Risk Interventions No flowsheet data found.

## 2018-12-11 NOTE — Progress Notes (Signed)
Physical Therapy Treatment Note  Patient seen for mobility progression. This session focused on functional transfer and gait training using L platform RW. Pt is making progress toward PT goals and tolerated mobility well. Continue to progress as tolerated with anticipated d/c to SNF for further skilled PT services.     12/11/18 1132  PT Visit Information  Last PT Received On 12/11/18  Assistance Needed +2  History of Present Illness Pt is a 43 y.o. F with significant PMH of diabetes type 2, diabetic foot ulcer, who presents with sepsis with right foot infection, osteomyelitis and MRSA bacteremia and presumed endocarditis. 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 Pt now with L hand swelling and pain and awaiting MRI on hand to see if any infectious process. X ray was negative for fracture.   Precautions  Precautions Fall  Precaution Comments R BKA, wound vac  Other Brace R residual limb guard (shrinker and guard readjusted--falling off upon arrival)  Restrictions  Weight Bearing Restrictions Yes  RLE Weight Bearing NWB  Other Position/Activity Restrictions NWB L wrist per orthopedic note  Pain Assessment  Pain Assessment No/denies pain  Cognition  Arousal/Alertness Awake/alert  Behavior During Therapy WFL for tasks assessed/performed;Impulsive  Overall Cognitive Status History of cognitive impairments - at baseline (this therapist is familiar with pt from previous admissions)  Area of Impairment Problem solving;Safety/judgement;Awareness;Memory;Following commands  Memory Decreased recall of precautions;Decreased short-term memory  Following Commands Follows one step commands with increased time  Safety/Judgement Decreased awareness of safety;Decreased awareness of deficits  Awareness Emergent  Problem Solving Requires verbal cues;Difficulty sequencing;Requires tactile cues  Bed Mobility  Overal bed mobility Needs Assistance  Bed Mobility Rolling;Sidelying to Sit  Rolling Min guard   Sidelying to sit Min guard;Min assist  General bed mobility comments assist to elevate trunk all the way into sitting to ensure NWB status maintained  Transfers  Overall transfer level Needs assistance  Equipment used Left platform walker  Transfers Sit to/from Stand;Stand Pivot Transfers  Sit to Stand +2 safety/equipment;Mod assist;+2 physical assistance;Min assist  Stand pivot transfers +2 physical assistance;+2 safety/equipment;Mod assist  General transfer comment cues for safe hand placement and to maintain all precautions with each trial; pt stood from elevated bed height with min A +2 [facility] with mod A +2; assistance to power up into standing and for balance and managing RW to pivot  Ambulation/Gait  Ambulation/Gait assistance Mod assist;+2 safety/equipment  Gait Distance (Feet) 4 Feet  Assistive device Left platform walker  Gait Pattern/deviations Step-to pattern;Trunk flexed  General Gait Details cues for upright posture, sequencing, and safe use of AD; pt requires assistance for balance and managing RW   Balance  Overall balance assessment Needs assistance  Sitting-balance support Feet supported;Single extremity supported;No upper extremity supported  Sitting balance-Leahy Scale Fair  Standing balance support Bilateral upper extremity supported;During functional activity  Standing balance-Leahy Scale Poor  PT - End of Session  Equipment Utilized During Treatment Gait belt  Activity Tolerance Patient tolerated treatment well  Patient left in chair;with call bell/phone within reach;with chair alarm set  Nurse Communication Mobility status   PT - Assessment/Plan  PT Plan Current plan remains appropriate  PT Visit Diagnosis Other abnormalities of gait and mobility (R26.89);Pain  Pain - Right/Left Left  Pain - part of body Hand  PT Frequency (ACUTE ONLY) Min 3X/week  Follow Up Recommendations SNF;Supervision/Assistance - 24 hour  PT equipment Other  (comment) (TBD next venue)  AM-PAC PT "6 Clicks" Mobility Outcome Measure (  Version 2)  Help needed turning from your back to your side while in a flat bed without using bedrails? 3  Help needed moving from lying on your back to sitting on the side of a flat bed without using bedrails? 3  Help needed moving to and from a bed to a chair (including a wheelchair)? 2  Help needed standing up from a chair using your arms (e.g., wheelchair or bedside chair)? 2  Help needed to walk in hospital room? 2  Help needed climbing 3-5 steps with a railing?  1  6 Click Score 13  Consider Recommendation of Discharge To: CIR/SNF/LTACH  PT Goal Progression  Progress towards PT goals Progressing toward goals  PT Time Calculation  PT Start Time (ACUTE ONLY) 1055  PT Stop Time (ACUTE ONLY) 1126  PT Time Calculation (min) (ACUTE ONLY) 31 min  PT General Charges  $$ ACUTE PT VISIT 1 Visit  PT Treatments  $Gait Training 23-37 mins   Erline Levine, PTA Acute Rehabilitation Services Pager: [phone] Office: [phone]

## 2018-12-11 NOTE — Progress Notes (Signed)
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  On 5/20, patient presented at [facility] with complaint of worsening pain and swelling of her right foot for 1 week.  CT scanning was obtained which was concerning for destruction of the entire mid and hindfoot with fluid and gas collections, surrounding cellulitis. She was transferred to [facility] as a direct admit.  On presentation, patient was septic with a heart rate 80s, blood pressure in 80s and lactate elevated to 5.7. Infectious disease and orthopedic consult were obtained.  Assessment & Plan:   Principal Problem:   MRSA bacteremia Active Problems:   Diabetes mellitus type 2, uncontrolled, with complications ([place])   Charcot foot due to diabetes mellitus ([place])   Severe protein-calorie malnutrition ([place])   Diabetic polyneuropathy associated with type 2 diabetes mellitus ([place])   Cigarette smoker   Subacute osteomyelitis, right ankle and foot ([place])   Cutaneous abscess of right foot   Abscess of bursa, left wrist   Abscess of left hand   Sepsis, present on admission MRSA septicemia Diabetic right foot infection, osteomyelitis Patient presenting as a transfer from [facility] rocking him with worsening pain and swelling over right foot x1 week.  CT foot notable for destruction of the entire midfoot and hindfoot with fluid and gas collections and surrounding cellulitis.  ESR 136, CRP 22.1.  Lactic acid 1.4.  Blood Cultures from 020 2 out of 2 positive for MRSA. --Orthopedics, Dr. 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 --Infectious disease following, appreciate assistance --TEE on 5/22 with no indication of endocarditis --repeat blood cultures x 2 on 5/22 negative --ID recommends to complete 4 weeks of IV vancomycin, through 12/25/2018 --PICC line placed, RUE --Goal vancomycin trough 15-20; pharmacy for monitoring/dosing  Left wrist extending into forearm with and palmaris tendon excision 2/2 necrosis MRI notable for heterogeneous focal abnormality of the volar subcutaneous fat measuring 2.6 x 1.3 x 3 cm concerning for phlegmon versus developing abscess. --Status post irrigation and debridement by orthopedics on 12/07/2018 with findings of purulent abscess with extension from the wrist into the forearm and necrotic soft tissue/palmaris tendon status post excision. --Intraoperative culture with rare staph aureus, susceptibilities pending --Continue IV antibiotics with vancomycin as above --Nonweightbearing left wrist --Follow-up orthopedics outpatient 1 week postop  Type 2 diabetes mellitus Hemoglobin A1c 7.9.  Home regimen includes Humalog 75/25 30u BID and NovoLog 3 units TIDAC --Glucose continues in the upper 100s --Increase Lantus from 35 to 38 units [place] BID --Increase Novolog from 12u to 15u TIDAC --Moderate dose insulin sliding scale for further coverage --continue CBG's qAC/HS  Essential hypertension BP stable  Right lower extremity DVT Diagnosed in 07/2018, used to be on Xarelto, she completed the starter pack however Xarelto was discontinued due to bleeding issues at [facility] in 09/2018. Right lower extremity Dopplers on 3/29 did not show any DVT, so Xarelto was not resumed.  Hypercalcemia: resolved Likely due to sepsis, dehydration, calcium 10.5  with a  time of admission  Severe protein calorie malnutrition Albumin 1.2 --Placed on pro-stat, nutritional supplements --Encourage increased oral intake  Acute on chronic anemia, likely exacerbated by some postop blood loss anemia --H&H currently stable baseline 9-10 --Hemoglobin 7.4 at OSH, transfused 1 unit packed RBC,  --H&H 7.0-->8.2 following 1u pRBC on 6/1 --Continue to monitor hemoglobin and transfuse for less than 7.   DVT prophylaxis: Heparin Code Status: Full code Family Communication: none Disposition Plan: Pending SNF placement, case management for coordination, looking at [facility] [place], [place] patient    Consultants:   Orthopedics, Dr. Sharol Given  Infectious disease, Dr. Megan Salon  Procedures:   Transtibial amputation right lower extremity 11/30/2018  PICC line placement, RUE 5/26  Antimicrobials:   Vancomycin 11/28/2018>>>   Subjective: Patient seen and examined at bedside, resting comfortably in bed. No complaints this morning.   Denies headache, no fever/chills/night sweats, no nausea/vomiting/diarrhea, no chest pain, no palpitations, no abdominal pain.  No acute events overnight per nursing staff.  Objective: Vitals:   12/10/18 1509 12/10/18 2142 12/11/18 0500 12/11/18 0605  BP: (!) 149/83 (!) 145/86  (!) 164/86  Pulse: (!) 112 (!) 103  (!) 101  Resp: '18 18  20  ' Temp:  97.9 F (36.6 C)  98.2 F (36.8 C)  TempSrc:  Oral  Oral  SpO2: 99% 97%  97%  Weight:   81 kg   Height:        Intake/Output Summary (Last 24 hours) at 12/11/2018 1356 Last data filed at 12/11/2018 0301 Gross per 24 hour  Intake 1612.1 ml  Output 351 ml  Net 1261.1 ml   Filed Weights   12/08/18 0545 12/10/18 0500 12/11/18 0500  Weight: 80.3 kg 81.1 kg 81 kg    Examination:  General exam: Appears calm and comfortable  Respiratory system: Clear to auscultation. Respiratory effort normal. Cardiovascular system: S1 & S2 heard, RRR. No JVD,  murmurs, rubs, gallops or clicks. No pedal edema. Gastrointestinal system: Abdomen is nondistended, soft and nontender. No organomegaly or masses felt. Normal bowel sounds heard. Central nervous system: Alert and oriented. No focal neurological deficits. 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 Mood & affect appropriate.     Data Reviewed: I have personally reviewed following labs and imaging studies  CBC: Recent Labs  Lab 12/06/18 0402 12/07/18 0422 12/08/18 0457 12/09/18 0423 12/10/18 0021  WBC 15.8* 13.6* 17.5* 12.0* 10.9*  HGB 7.3* 7.0* 7.5* 7.0* 8.2*  HCT 23.1* 22.3* 23.8* 22.7* 25.5*  MCV 95.9 98.7 97.5 99.1 96.6  PLT 412* 377 414* 367 106   Basic Metabolic Panel: Recent Labs  Lab 12/06/18 0402 12/07/18 0422 12/08/18 0457 12/09/18 0423 12/10/18 0021  NA 139 139 135 139 138  K 3.7 3.5 3.8 4.0 3.8  CL 108 110 101 106 104  CO2 '24 24 25 27 29  ' GLUCOSE 235* 150* 326* 198* 214*  BUN '6 8 10 8 10  ' CREATININE 0.59 0.41* 0.50 0.51 0.48  CALCIUM 6.6* 6.9* 7.2* 7.7* 8.2*  MG 1.6* 1.5* 1.6*  --   --    GFR: Estimated Creatinine Clearance: 98.3 mL/min (by C-G formula based on SCr of 0.48 mg/dL). Liver Function Tests: No results for input(s): AST, ALT, ALKPHOS, BILITOT, PROT, ALBUMIN in the last 168 hours. No results for input(s): LIPASE, AMYLASE in the last 168 hours. No results for  input(s): AMMONIA in the last 168 hours. Coagulation Profile: No results for input(s): INR, PROTIME in the last 168 hours. Cardiac Enzymes: No results for input(s): CKTOTAL, CKMB, CKMBINDEX, TROPONINI in the last 168 hours. BNP (last 3 results) No results for input(s): PROBNP in the last 8760 hours. HbA1C: No results for input(s): HGBA1C in the last 72 hours. CBG: Recent Labs  Lab 12/10/18 1213 12/10/18 1724 12/10/18 2145 12/11/18 0800 12/11/18 1155   GLUCAP 237* 158* 79 276* 153*   Lipid Profile: No results for input(s): CHOL, HDL, LDLCALC, TRIG, CHOLHDL, LDLDIRECT in the last 72 hours. Thyroid Function Tests: No results for input(s): TSH, T4TOTAL, FREET4, T3FREE, THYROIDAB in the last 72 hours. Anemia Panel: No results for input(s): VITAMINB12, FOLATE, FERRITIN, TIBC, IRON, RETICCTPCT in the last 72 hours. Sepsis Labs: No results for input(s): PROCALCITON, LATICACIDVEN in the last 168 hours.  Recent Results (from the past 240 hour(s))  Aerobic/Anaerobic Culture (surgical/deep wound)     Status: None (Preliminary result)   Collection Time: 12/07/18 12:49 PM  Result Value Ref Range Status   Specimen Description ABSCESS  Final   Special Requests NONE  Final   Gram Stain   Final    RARE WBC PRESENT, PREDOMINANTLY MONONUCLEAR NO ORGANISMS SEEN Performed at [facility], [address] [place],  [address]    Culture   Final    RARE METHICILLIN RESISTANT STAPHYLOCOCCUS AUREUS NO ANAEROBES ISOLATED; CULTURE IN PROGRESS FOR 5 DAYS    Report Status PENDING  Incomplete   Organism ID, Bacteria METHICILLIN RESISTANT STAPHYLOCOCCUS AUREUS  Final      Susceptibility   Methicillin resistant staphylococcus aureus - MIC*    CIPROFLOXACIN >=8 RESISTANT Resistant     ERYTHROMYCIN >=8 RESISTANT Resistant     GENTAMICIN <=0.5 SENSITIVE Sensitive     OXACILLIN >=4 RESISTANT Resistant     TETRACYCLINE <=1 SENSITIVE Sensitive     VANCOMYCIN 1 SENSITIVE Sensitive     TRIMETH/SULFA <=10 SENSITIVE Sensitive     CLINDAMYCIN >=8 RESISTANT Resistant     RIFAMPIN <=0.5 SENSITIVE Sensitive     Inducible Clindamycin NEGATIVE Sensitive     * RARE METHICILLIN RESISTANT STAPHYLOCOCCUS AUREUS         Radiology Studies: No results found.      Scheduled Meds: . docusate sodium  100 mg Oral BID  . heparin injection (subcutaneous)  5,000 Units Subcutaneous Q8H  . insulin aspart  0-15 Units Subcutaneous TID WC  . insulin aspart   0-5 Units Subcutaneous QHS  . insulin aspart  15 Units Subcutaneous TID WC  . insulin glargine  38 Units Subcutaneous BID  . multivitamin with minerals  1 tablet Oral Daily  . Ensure Max Protein  11 oz Oral Daily   Continuous Infusions: . sodium chloride 250 mL (11/30/18 0650)  . sodium chloride 10 mL/hr at 12/04/18 2231  . lactated ringers 10 mL/hr at 12/07/18 1121  . vancomycin 750 mg (12/11/18 0938)     LOS: 13 days    Time spent: 62 minutes     J [place] DO [facility] Pager [phone]  If 7PM-7AM, please contact night-coverage [email or web address] Password [email or web address] 12/11/2018, 1:56 PM

## 2018-12-12 LAB — AEROBIC/ANAEROBIC CULTURE W GRAM STAIN (SURGICAL/DEEP WOUND)

## 2018-12-12 LAB — GLUCOSE, CAPILLARY
Glucose-Capillary: 123 mg/dL — ABNORMAL HIGH (ref 70–99)
Glucose-Capillary: 126 mg/dL — ABNORMAL HIGH (ref 70–99)
Glucose-Capillary: 107 mg/dL — ABNORMAL HIGH (ref 70–99)
Glucose-Capillary: 195 mg/dL — ABNORMAL HIGH (ref 70–99)
Glucose-Capillary: 347 mg/dL — ABNORMAL HIGH (ref 70–99)

## 2018-12-12 LAB — CBC
HCT: 27.5 % — ABNORMAL LOW (ref 36.0–46.0)
Hemoglobin: 8.6 g/dL — ABNORMAL LOW (ref 12.0–15.0)
MCH: 31.2 pg (ref 26.0–34.0)
MCHC: 31.3 g/dL (ref 30.0–36.0)
MCV: 99.6 fL (ref 80.0–100.0)
Platelets: 351 10*3/uL (ref 150–400)
RBC: 2.76 MIL/uL — ABNORMAL LOW (ref 3.87–5.11)
RDW: 20 % — ABNORMAL HIGH (ref 11.5–15.5)
WBC: 9.6 10*3/uL (ref 4.0–10.5)
nRBC: 0 % (ref 0.0–0.2)

## 2018-12-12 LAB — BASIC METABOLIC PANEL
Anion gap: 9 (ref 5–15)
BUN: 13 mg/dL (ref 6–20)
CO2: 30 mmol/L (ref 22–32)
Calcium: 9 mg/dL (ref 8.9–10.3)
Chloride: 99 mmol/L (ref 98–111)
Creatinine, Ser: 0.59 mg/dL (ref 0.44–1.00)
GFR calc Af Amer: >60 mL/min (ref >60)
GFR calc non Af Amer: >60 mL/min (ref >60)
Glucose, Bld: 211 mg/dL — ABNORMAL HIGH (ref 70–99)
Potassium: 3.5 mmol/L (ref 3.5–5.1)
Sodium: 138 mmol/L (ref 135–145)

## 2018-12-12 LAB — VANCOMYCIN, TROUGH: Vancomycin Tr: 11 ug/mL — ABNORMAL LOW (ref 15–20)

## 2018-12-12 LAB — VANCOMYCIN, PEAK: Vancomycin Pk: 23 ug/mL — ABNORMAL LOW (ref 30–40)

## 2018-12-12 MED ORDER — AMLODIPINE BESYLATE 5 MG PO TABS
5.0000 mg | ORAL_TABLET | Freq: Every day | ORAL | Status: DC
  Administered 2018-12-12: 5 mg via ORAL
  Filled 2018-12-12: qty 1

## 2018-12-12 MED ORDER — DIPHENOXYLATE-ATROPINE 2.5-0.025 MG PO TABS
1.0000 | ORAL_TABLET | Freq: Four times a day (QID) | ORAL | Status: DC | PRN
  Administered 2018-12-12 – 2018-12-16 (×5): 1 via ORAL
  Filled 2018-12-12 (×5): qty 1

## 2018-12-12 MED ORDER — INSULIN GLARGINE 100 UNIT/ML ~~LOC~~ SOLN
20.0000 [IU] | Freq: Two times a day (BID) | SUBCUTANEOUS | Status: DC
  Administered 2018-12-12: 20 [IU] via SUBCUTANEOUS
  Filled 2018-12-12 (×3): qty 0.2

## 2018-12-12 NOTE — Progress Notes (Signed)
Occupational Therapy Treatment Patient Details Name: Yolanda [name] MRN: [id number] DOB: 1976/05/26 Today's Date: 12/12/2018    History of present illness Pt is a 43 y.o. F with significant PMH of diabetes type 2, diabetic foot ulcer, who presents with sepsis with right foot infection, osteomyelitis and MRSA bacteremia and presumed endocarditis. 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 Pt now with L hand swelling and pain and awaiting MRI on hand to see if any infectious process. X ray was negative for fracture.    OT comments  Upon arrival pt sitting in bed reporting she had had a BM and needed to be cleaned up. Pt rolling in bed with minguard and assist to complete pericare. Pt able to progress to EOB with minguard assist and complete general UE exercises while seated EOB. Pt reports feeling depressed. Discussed grieving process associated with BKA and discussed referral for counseling, nsg aware. Pt will continue to benefit from skilled OT services to maximize safety and independence with ADL and functional mobility. Will continue to follow acutely. Continue to recommend SNF follow-up.    Follow Up Recommendations  SNF;Supervision/Assistance - 24 hour    Equipment Recommendations  3 in 1 bedside commode    Recommendations for Other Services Other (comment)(Counseling Consult)    Precautions / Restrictions Precautions Precautions: Fall Precaution Comments: R BKA, wound vac Required Braces or Orthoses: Other Brace Other Brace: R residual limb guard Restrictions Weight Bearing Restrictions: Yes RLE Weight Bearing: Non weight bearing Other Position/Activity Restrictions: NWB L wrist per orthopedic note       Mobility Bed Mobility Overal bed mobility: Needs Assistance Bed Mobility: Rolling;Sidelying to Sit;Sit to Sidelying Rolling: Min guard Sidelying to sit: Min guard Supine to sit: Min guard     General bed mobility comments: minguard for safety  Transfers                  General transfer comment: deferred    Balance Overall balance assessment: Needs assistance Sitting-balance support: Feet unsupported;No upper extremity supported Sitting balance-[name] Sitting balance - Comments: performed general UE exercises sitting EOB                                   ADL either performed or assessed with clinical judgement   ADL Overall ADL's : Needs assistance/impaired Eating/Feeding: Modified independent;Sitting   Grooming: Modified independent;Sitting   Upper Body Bathing: Set up;Sitting   Lower Body Bathing: Moderate assistance;Sitting/lateral leans;Bed level               Toileting- Clothing Manipulation and Hygiene: Moderate assistance Toileting - Clothing Manipulation Details (indicate cue type and reason): rolling in bed to perform pericare, pt minguard with rolling but requires assistance to thoroughly clean       General ADL Comments: pt limited this session secondary to reports of upset stomach     Vision   Vision Assessment?: No apparent visual deficits   Perception     Praxis      Cognition Arousal/Alertness: Awake/alert Behavior During Therapy: WFL for tasks assessed/performed;Impulsive Overall Cognitive Status: History of cognitive impairments - at baseline Area of Impairment: Problem solving;Safety/judgement;Awareness;Memory;Following commands                     Memory: Decreased recall of precautions;Decreased short-term memory Following Commands: Follows one step commands with increased time Safety/Judgement: Decreased awareness of safety;Decreased awareness of deficits Awareness: Emergent Problem Solving:  Requires verbal cues;Difficulty sequencing;Requires tactile cues General Comments: mod cueing to maintain NWB through Lwrist;pt with decreased awareness of deficits and safety requiring cues, slightly impulsive        Exercises Exercises: Hand exercises;General Upper  Extremity General Exercises - Upper Extremity Shoulder Flexion: AROM;Both;10 reps;Seated Shoulder Extension: AROM;Both;10 reps;Seated Elbow Flexion: AROM;10 reps;Both;Seated Elbow Extension: AROM;Both;10 reps;Seated Hand Exercises Digit Composite Flexion: AROM;10 reps;Left Composite Extension: AROM;Left;10 reps   Shoulder Instructions       General Comments continued to encourage HEP general UE ROM;discussed coping strategies with pt as pt states she "feels depressed";educated pt on phantom limb pain/sensation and grieving process along with losing a limb;discussed referral for counseling, pt hesitant but feel she may benefit, pt stating she 'just wants to go outside" nsg aware    Pertinent Vitals/ Pain       Pain Assessment: No/denies pain Pain Intervention(s): Monitored during session  Home Living                                          Prior Functioning/Environment              Frequency  Min 2X/week        Progress Toward Goals  OT Goals(current goals can now be found in the care plan section)  Progress towards OT goals: Progressing toward goals  Acute Rehab OT Goals Patient Stated Goal: to be able to go outside OT Goal Formulation: With patient Time For Goal Achievement: 12/16/18 Potential to Achieve Goals: Good ADL Goals Pt Will Perform Lower Body Bathing: with min assist;sitting/lateral leans;with adaptive equipment Pt Will Perform Lower Body Dressing: with min assist;sitting/lateral leans;sit to/from stand;with adaptive equipment Pt Will Transfer to Toilet: with min assist;bedside commode;squat pivot transfer Pt Will Perform Toileting - Clothing Manipulation and hygiene: with min assist;sitting/lateral leans;sit to/from stand;with adaptive equipment Pt/caregiver will Perform Home Exercise Program: Increased strength;Increased ROM;Both right and left upper extremity;With written HEP provided  Plan Discharge plan remains  appropriate;Frequency remains appropriate    Co-evaluation                 AM-PAC OT "6 Clicks" Daily Activity     Outcome Measure   Help from another person eating meals?: None Help from another person taking care of personal grooming?: None Help from another person toileting, which includes using toliet, bedpan, or urinal?: A Lot Help from another person bathing (including washing, rinsing, drying)?: A Lot Help from another person to put on and taking off regular upper body clothing?: A Little Help from another person to put on and taking off regular lower body clothing?: A Lot 6 Click Score: 17    End of Session    OT Visit Diagnosis: Unsteadiness on feet (R26.81);Muscle weakness (generalized) (M62.81)   Activity Tolerance Patient tolerated treatment well;Other (comment)(Patient limited by upset stomach and reports of diarrhea )   Patient Left in bed;with call bell/phone within reach;with bed alarm set   Nurse Communication Mobility status        Time: [id number] OT Time Calculation (min): 28 min  Charges: OT General Charges $OT Visit: 1 Visit OT Treatments $Self Care/Home Management : 8-22 mins $Therapeutic Exercise: 8-22 mins  Diona Brownereresa Shavelle Runkel OTR/L Acute Rehabilitation Services Office: [phone]    Rebeca Alerteresa J Jamieka Royle 12/12/2018, 4:25 PM

## 2018-12-12 NOTE — Progress Notes (Signed)
Patient ID: Yolanda [name], female   DOB: 17-Apr-1976, 43 y.o.   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  She presented on 11/28/2018 with worsening pain and swelling of her right foot.  CT scan showed destruction of the entire mid and hindfoot with fluid and gas collections with surrounding cellulitis.  [facility].  She was started on intravenous antibiotics.  ID and orthopedics were consulted.  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  ID recommends IV vancomycin till 12/25/2018.  She is waiting for SNF placement.  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)   Severe protein-calorie malnutrition (HCC)   Diabetic polyneuropathy associated with type 2 diabetes mellitus (HCC)   Cigarette smoker   Subacute osteomyelitis, right ankle and foot (HCC)   Cutaneous abscess of right foot   Abscess of bursa, left wrist   Abscess of left hand  Sepsis: Present on admission MRSA bacteremia in a patient with history of recent MRSA bacteremia with Diabetic right foot infection/osteomyelitis -[facility] with [facility] of the foot showing destruction of the entire midfoot and hindfoot with fluid and gas collections and surrounding cellulitis. 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  Wound care as per orthopedics recommendations.  Outpatient follow-up with orthopedics/Dr. Lajoyce Cornersuda.  -Sepsis has resolved.  Currently hemodynamically stable. -Blood culture on presentation was positive for MRSA.  Repeat blood cultures from 11/30/2018 have been negative so far. -TEE on 11/30/2018 showed no evidence of endocarditis -I recommended 4 weeks of IV vancomycin through 12/25/2018.  Goal vancomycin trough of 15-20.  Pharmacy is monitoring and dosing vancomycin.  Monitor labs intermittently -Currently has a right upper extremity PICC line.  Left wrist/forearm abscess -MRI had shown 2.6 x 1.3 x 3 cm collection concerning for phlegmon versus developing abscess. -Status post I&D by orthopedics on 12/07/2018 with findings of purulent abscess with extension from the wrist into the forearm and necrotic soft tissue/palmaris tendon status post excision -continue iv vancomycin -Nonweightbearing left wrist -Follow-up orthopedics outpatient 1 week postop  Diabetes mellitus type 2 -Hemoglobin A1c 7.9 -Blood sugars on the lower side. -We will decrease Lantus to 20 units twice daily and hold short-acting insulin with meals for now. -Continue CBGs with SSI.  Diarrhea -Continue Imodium as needed.  Add Lomotil as well  Severe protein calorie malnutrition -Albumin 1.2  -Continue diet as per nutrition recommendations  Essential hypertension -Blood pressure on the higher side.  Will start amlodipine 5 mg daily.  Monitor  Acute on chronic anemia, likely exacerbated by some postop blood loss anemia -Hemoglobin stable today.  Hemoglobin 7.4 at outside hospital and was transfused 1 unit packed red cells -Transfuse if hemoglobin is less than 7.   DVT prophylaxis: Heparin Code Status: Full Family Communication: None at bedside Disposition Plan: SNF once bed is available  Consultants: Orthopedics/ID  Procedures:  Transtibial amputation right lower extremity 11/30/2018 I&D of left wrist/elbow on 12/07/2018 PICC line placement, right upper extremity 12/04/2018  Antimicrobials:  Vancomycin from  11/28/2018 onwards  Subjective: Patient seen and examined at bedside.  She is awake.  Complains of diarrhea.  Poor historian.  No overnight fever, nausea, vomiting or worsening abdominal pain.  Objective: Vitals:   12/11/18 0605 12/11/18 1554 12/11/18 2304 12/12/18 0632  BP: (!) 164/86 (!) 180/90 (!) 158/89 (!) 161/96  Pulse: (!) 101 (!) 101 (!) 104 93  Resp: Temp: 98.2 F (36.8 C) 98.1 F (36.7 C) 97.9 F (36.6 C) 97.8 F (36.6 C)  TempSrc: Oral Oral    SpO2: 97% 100% 100% 100%  Weight:      Height:        Intake/Output Summary (Last 24 hours) at 12/12/2018 1155 Last data filed at 12/12/2018 1143 Gross per 24 hour  Intake 720 ml  Output 7 ml  Net 713 ml   Filed Weights   12/08/18 0545 12/10/18 0500 12/11/18 0500  Weight: 80.3 kg 81.1 kg 81 kg    Examination:  General exam: Appears calm and comfortable.  No distress.  Poor historian Respiratory system: Bilateral decreased breath sounds at bases Cardiovascular system: S1 & S2 heard, Rate controlled Gastrointestinal system: Abdomen is nondistended, soft and nontender. Normal bowel sounds heard. Extremities: No cyanosis, clubbing, edema.  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  Left wrist dressing present.  Data Reviewed: I have personally reviewed following labs and imaging studies  CBC: Recent Labs  Lab 12/07/18 0422 12/08/18 0457 12/09/18 0423 12/10/18 0021 12/12/18 0355  WBC 13.6* 17.5* 12.0* 10.9* 9.6  HGB 7.0* 7.5* 7.0* 8.2* 8.6*  HCT 22.3* 23.8* 22.7* 25.5* 27.5*  MCV 98.7 97.5 99.1 96.6 99.6  PLT 377 414* 367 362 351   Basic Metabolic Panel: Recent Labs  Lab 12/06/18 0402 12/07/18 0422 12/08/18 0457 12/09/18 0423 12/10/18 0021 12/12/18 0355  NA 139 139 135 139 138 138  K 3.7 3.5 3.8 4.0 3.8 3.5  CL 108 110 101 106 104 99  CO2 GLUCOSE 235* 150* 326* 198* 214* 211*  BUN CREATININE 0.59 0.41* 0.50 0.51 0.48 0.59  CALCIUM 6.6* 6.9* 7.2* 7.7* 8.2* 9.0  MG  1.6* 1.5* 1.6*  --   --   --    GFR: Estimated Creatinine Clearance: 98.3 mL/min (by C-G formula based on SCr of 0.59 mg/dL). Liver Function Tests: No results for input(s): AST, ALT, ALKPHOS, BILITOT, PROT, ALBUMIN in the last 168 hours. No results for input(s): LIPASE, AMYLASE in the last 168 hours. No results for input(s): AMMONIA in the last 168 hours. Coagulation Profile: No results for input(s): INR, PROTIME in the last 168 hours. Cardiac Enzymes: No results for input(s): CKTOTAL, CKMB, CKMBINDEX, TROPONINI in the last 168 hours. BNP (last 3 results) No results for input(s): PROBNP in the last 8760 hours. HbA1C: No results for input(s): HGBA1C in the last 72 hours. CBG: Recent Labs  Lab 12/11/18 1610 12/11/18 2305 12/12/18 0748 12/12/18 0854 12/12/18 1114  GLUCAP 110* 199* 123* 126* 107*   Lipid Profile: No results for input(s): CHOL, HDL, LDLCALC, TRIG, CHOLHDL, LDLDIRECT in the last 72 hours. Thyroid Function Tests: No results for input(s): TSH, T4TOTAL, FREET4, T3FREE, THYROIDAB in the last 72 hours. Anemia Panel: No results for input(s): VITAMINB12, FOLATE, FERRITIN, TIBC, IRON, RETICCTPCT in the last 72 hours. Sepsis Labs: No results for input(s): PROCALCITON, LATICACIDVEN in the last 168 hours.  Recent Results (from the past 240 hour(s))  Aerobic/Anaerobic Culture (surgical/deep wound)  Status: None (Preliminary result)   Collection Time: 12/07/18 12:49 PM  Result Value Ref Range Status   Specimen Description ABSCESS  Final   Special Requests NONE  Final   Gram Stain   Final    RARE WBC PRESENT, PREDOMINANTLY MONONUCLEAR NO ORGANISMS SEEN Performed at [facility], [address], [place], [place] [address]    Culture   Final    RARE METHICILLIN RESISTANT STAPHYLOCOCCUS AUREUS NO ANAEROBES ISOLATED; CULTURE IN PROGRESS FOR 5 DAYS    Report Status PENDING  Incomplete   Organism ID, Bacteria METHICILLIN RESISTANT STAPHYLOCOCCUS AUREUS  Final       Susceptibility   Methicillin resistant staphylococcus aureus - MIC*    CIPROFLOXACIN >=8 RESISTANT Resistant     ERYTHROMYCIN >=8 RESISTANT Resistant     GENTAMICIN <=0.5 SENSITIVE Sensitive     OXACILLIN >=4 RESISTANT Resistant     TETRACYCLINE <=1 SENSITIVE Sensitive     VANCOMYCIN 1 SENSITIVE Sensitive     TRIMETH/SULFA <=10 SENSITIVE Sensitive     CLINDAMYCIN >=8 RESISTANT Resistant     RIFAMPIN <=0.5 SENSITIVE Sensitive     Inducible Clindamycin NEGATIVE Sensitive     * RARE METHICILLIN RESISTANT STAPHYLOCOCCUS AUREUS         Radiology Studies: No results found.      Scheduled Meds: . docusate sodium  100 mg Oral BID  . heparin injection (subcutaneous)  5,000 Units Subcutaneous Q8H  . insulin aspart  0-15 Units Subcutaneous TID WC  . insulin aspart  0-5 Units Subcutaneous QHS  . insulin aspart  15 Units Subcutaneous TID WC  . insulin glargine  38 Units Subcutaneous BID  . multivitamin with minerals  1 tablet Oral Daily  . Ensure Max Protein  11 oz Oral Daily   Continuous Infusions: . sodium chloride 250 mL (11/30/18 0650)  . sodium chloride 10 mL/hr at 12/04/18 2231  . lactated ringers 10 mL/hr at 12/07/18 1121  . vancomycin 750 mg (12/12/18 0936)     LOS: 14 days        Glade Lloyd, MD [facility] 12/12/2018, 11:55 AM

## 2018-12-12 NOTE — Progress Notes (Signed)
Nutrition Follow-up  RD working remotely.  DOCUMENTATION CODES:   Not applicable  INTERVENTION:   -Continue MVI with minerals daily -Continue Ensure Max po daily, each supplement provides 150 kcal and 30 grams of protein.   NUTRITION DIAGNOSIS:   Increased nutrient needs related to wound healing, post-op healing as evidenced by estimated needs.  Ongoing  GOAL:   Patient will meet greater than or equal to 90% of their needs  Progressing  MONITOR:   PO intake, Supplement acceptance, Labs, Weight trends, Skin, I & O's  REASON FOR ASSESSMENT:   Malnutrition Screening Tool, Consult Wound healing  ASSESSMENT:   Yolanda [name] is a 43 y.o. female with medical history significant of T2DM, diabetic foot ulcer, osteomyelitis and cellulitis with MRSA bacteremia, presumed endocarditis presenting with worsening right lower extremity pain and swelling.  5/22- s/p PROCEDURE: Transtibial amputation Application of Prevena wound VAC 5/26- PICC placed, s/p TEE- no endocarditis 5/28- MRI of lt wrist reveals fluid collection in the volar aspect radial lt wrist 5/29- s/p PROCEDURE:  IRRIGATION AND DEBRIDEMENT LEFT WRIST; Irrigation and debridement left hand.  Reviewed I/O's: +239 ml x 24 hours and +4.8 L since admission  Pt remains with good appetite; noted 100% meal completion. Pt is also accepting on MVI and Ensure Max supplements.   Plan to d/c to SNF once medically stable.   Labs reviewed: CBGS: 110-199 (inpatient orders for glycemic control are 0-15 units insulin aspart TID with meals, 0-5 units insulin apsart q HS, 15 units insulin aspart TD with meals, and 38 units insulin glargine BID).   Diet Order:   Diet Order            Diet Carb Modified Fluid consistency: Thin; Room service appropriate? Yes with Assist  Diet effective now              EDUCATION NEEDS:   No education needs have been identified at this time  Skin:  Skin Assessment: Skin Integrity Issues: Skin  Integrity Issues:: Incisions Wound Vac: rt BKA site Incisions: s/p rt BKA Other: closed lt arm  Last BM:  12/11/18  Height:   Ht Readings from Last 1 Encounters:  11/28/18 5\' 6"  (1.676 m)    Weight:   Wt Readings from Last 1 Encounters:  12/11/18 81 kg    Ideal Body Weight:  55.3 kg(adjusted for rt BKA)  BMI:  Body mass index is 28.82 kg/m.  Estimated Nutritional Needs:   Kcal:  1700-1900  Protein:  95-110 grams  Fluid:  > 1.7 L    Yuvaan Olander A. Mayford Knife, RD, LDN, CDCES Registered Dietitian II Certified Diabetes Care and Education Specialist Pager: [phone] After hours Pager: [phone]

## 2018-12-12 NOTE — TOC Progression Note (Addendum)
Transition of Care [facility]) - Progression Note    Patient Details  Name: Yolanda [name] MRN: [id number] Date of Birth: [birth year]  Transition of Care [facility]) CM/SW Contact  Mearl Latin, LCSW Phone Number: 12/12/2018, 9:34 AM  Clinical Narrative:    [facility] unable to accept patient. [facility] does not have beds available. Patient requested to return home, but CSW told her that is up to her and her family. Per her father, he wants patient to go to SNF first.    Expected Discharge Plan: Skilled Nursing Facility Barriers to Discharge: Continued Medical Work up  Expected Discharge Plan and Services Expected Discharge Plan: Skilled Nursing Facility In-house Referral: Clinical Social Work Discharge Planning Services: NA Post Acute Care Choice: Skilled Nursing Facility Living arrangements for the past 2 months: Single Family Home                 DME Arranged: N/A DME Agency: NA       HH Arranged: NA HH Agency: NA         Social Determinants of Health (SDOH) Interventions    Readmission Risk Interventions No flowsheet data found.

## 2018-12-12 NOTE — Progress Notes (Signed)
Blood sugar 107 Dr Hanley Ben notified, verbal order to hold 15 units of novolog with meal at this time.

## 2018-12-13 LAB — GLUCOSE, CAPILLARY
Glucose-Capillary: 178 mg/dL — ABNORMAL HIGH (ref 70–99)
Glucose-Capillary: 187 mg/dL — ABNORMAL HIGH (ref 70–99)
Glucose-Capillary: 232 mg/dL — ABNORMAL HIGH (ref 70–99)
Glucose-Capillary: 279 mg/dL — ABNORMAL HIGH (ref 70–99)

## 2018-12-13 MED ORDER — INSULIN GLARGINE 100 UNIT/ML ~~LOC~~ SOLN
25.0000 [IU] | Freq: Two times a day (BID) | SUBCUTANEOUS | Status: DC
  Administered 2018-12-13 (×2): 25 [IU] via SUBCUTANEOUS
  Filled 2018-12-13 (×4): qty 0.25

## 2018-12-13 MED ORDER — INSULIN ASPART 100 UNIT/ML ~~LOC~~ SOLN
5.0000 [IU] | Freq: Three times a day (TID) | SUBCUTANEOUS | Status: DC
  Administered 2018-12-13 (×3): 5 [IU] via SUBCUTANEOUS

## 2018-12-13 MED ORDER — AMLODIPINE BESYLATE 10 MG PO TABS
10.0000 mg | ORAL_TABLET | Freq: Every day | ORAL | Status: DC
  Administered 2018-12-13 – 2018-12-25 (×13): 10 mg via ORAL
  Filled 2018-12-13 (×13): qty 1

## 2018-12-13 NOTE — TOC Progression Note (Signed)
Transition of Care [facility]) - Progression Note    Patient Details  Name: Yolanda [name] MRN: [id number] Date of Birth: 1976-03-26  Transition of Care [facility]) CM/SW Contact  Mearl Latin, LCSW Phone Number: 12/13/2018, 3:53 PM  Clinical Narrative:    CSW checked to see if [place] facilities would be willing to accept patient on a Letter of Guarantee from the hospital. No LOG SNFs have availability currently due to COVID. CSW will continue search.   Expected Discharge Plan: Skilled Nursing Facility Barriers to Discharge: Continued Medical Work up  Expected Discharge Plan and Services Expected Discharge Plan: Skilled Nursing Facility In-house Referral: Clinical Social Work Discharge Planning Services: NA Post Acute Care Choice: Skilled Nursing Facility Living arrangements for the past 2 months: Single Family Home                 DME Arranged: N/A DME Agency: NA       HH Arranged: NA HH Agency: NA         Social Determinants of Health (SDOH) Interventions    Readmission Risk Interventions No flowsheet data found.

## 2018-12-13 NOTE — Progress Notes (Signed)
Physical Therapy Treatment Patient Details Name: Yolanda [name] MRN: [id number] DOB: 05-Jan-1976 Today's Date: 12/13/2018    History of Present Illness Pt is a 43 y.o. F with significant PMH of diabetes type 2, diabetic foot ulcer, who presents with sepsis with right foot infection, osteomyelitis and MRSA bacteremia and presumed endocarditis. 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 Pt now with L hand swelling and pain.      PT Comments    Pt in bed and very willing.  Pt declined to get to recliner but did agree to pre gait activities and TE's.  General bed mobility comments: minguard for safety with HOB elevated and use of rail.  General transfer comment: performed sit to stand x 5 as pre gait activity with 50% VC's to avoid WBing thru L wrist.  Performed static standing per gait activity x 2 min before c/o fatigue.  General Gait Details: pt only "[facility] a [facility] steps [facility]  Performed some B LE TE's hips ABD/ADD, hip flex, knee presses. Pt will need ST Rehab at SNF prior to returning home.   Follow Up Recommendations  SNF;Supervision/Assistance - 24 hour     Equipment Recommendations       Recommendations for Other Services       Precautions / Restrictions Precautions Precautions: Fall Precaution Comments: R BKA, wound vac Required Braces or Orthoses: Other Brace Other Brace: R residual limb guard Restrictions Weight Bearing Restrictions: Yes RLE Weight Bearing: Non weight bearing Other Position/Activity Restrictions: NWB L wrist per orthopedic note    Mobility  Bed Mobility Overal bed mobility: Needs Assistance Bed Mobility: Rolling;Sidelying to Sit;Sit to Sidelying Rolling: Min guard Sidelying to sit: Min guard       General bed mobility comments: minguard for safety with HOB elevated and use of rail  Transfers Overall transfer level: Needs assistance Equipment used: Left platform walker Transfers: Sit to/from Stand Sit to Stand: Mod assist;Min assist          General transfer comment: performed sit to stand x 5 as pre gait activity with 50% VC's to avoid WBing thru L wrist.  Performed static standing per gait activity x 2 min before c/o fatigue.    Ambulation/Gait             General Gait Details: pt only "hopped" a few side steps to [facility]   Stairs             Wheelchair Mobility    Modified Rankin (Stroke Patients Only)       Balance                                            Cognition Arousal/Alertness: Awake/alert Behavior During Therapy: WFL for tasks assessed/performed Overall Cognitive Status: Within Functional Limits for tasks assessed                                 General Comments: mod cueing to maintain NWB through Lwrist;pt with decreased awareness of deficits and safety requiring cues, slightly impulsive      Exercises      General Comments        Pertinent Vitals/Pain Pain Assessment: Faces Faces Pain Scale: Hurts a little bit Pain Location: L hand  Pain Descriptors / Indicators: Discomfort;Grimacing Pain Intervention(s): Monitored during session;Repositioned  Home Living                      Prior Function            PT Goals (current goals can now be found in the care plan section) Progress towards PT goals: Progressing toward goals    Frequency    Min 3X/week      PT Plan Current plan remains appropriate    Co-evaluation              AM-PAC PT "6 Clicks" Mobility   Outcome Measure  Help needed turning from your back to your side while in a flat bed without using bedrails?: A Little Help needed moving from lying on your back to sitting on the side of a flat bed without using bedrails?: A Little Help needed moving to and from a bed to a chair (including a wheelchair)?: A Lot Help needed standing up from a chair using your arms (e.g., wheelchair or bedside chair)?: A Lot Help needed to walk in hospital room?: A Lot Help needed  climbing 3-5 steps with a railing? : Total 6 Click Score: 13    End of Session Equipment Utilized During Treatment: Gait belt Activity Tolerance: Patient tolerated treatment well Patient left: in bed;with call bell/phone within reach Nurse Communication: Mobility status PT Visit Diagnosis: Other abnormalities of gait and mobility (R26.89);Pain Pain - Right/Left: Left Pain - part of body: Hand     Time: 0109-3235 PT Time Calculation (min) (ACUTE ONLY): 25 min  Charges:  $Therapeutic Exercise: 8-22 mins $Therapeutic Activity: 8-22 mins                     {Lorenso Quirino  PTA Acute  Rehabilitation Services Pager      [phone] Office      [phone]

## 2018-12-13 NOTE — Progress Notes (Signed)
Patient ID: Yolanda [name], female   DOB: 04-03-1976, 43 y.o.   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  She presented on 11/28/2018 with worsening pain and swelling of her right foot.  CT scan showed destruction of the entire mid and hindfoot with fluid and gas collections with surrounding cellulitis.  She was transferred to [facility] from [facility].  She was started on intravenous antibiotics.  ID and orthopedics were consulted.  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  ID recommends IV vancomycin till 12/25/2018.  She is waiting for SNF placement.  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)   Severe protein-calorie malnutrition (HCC)   Diabetic polyneuropathy associated with type 2 diabetes mellitus (HCC)   Cigarette smoker   Subacute osteomyelitis, right ankle and foot (HCC)   Cutaneous abscess of right foot   Abscess of bursa, left wrist   Abscess of left hand  Sepsis: Present on admission MRSA bacteremia in a patient with history of recent MRSA bacteremia with Diabetic right foot infection/osteomyelitis -Patient was transferred from [facility] with CT of the foot showing destruction of the entire midfoot and hindfoot with fluid and gas collections and surrounding cellulitis. 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  Wound care as per orthopedics recommendations.  Outpatient follow-up with orthopedics/Dr. Lajoyce Corners.  -Sepsis has resolved.  Currently hemodynamically stable. -Blood culture on presentation was positive for MRSA.  Repeat blood cultures from 11/30/2018 have been negative so far. -TEE on 11/30/2018 showed no evidence of endocarditis -ID recommended 4 weeks of IV vancomycin through 12/25/2018.  Goal vancomycin trough of 15-20.  Pharmacy is monitoring and dosing vancomycin.  Monitor labs intermittently -Currently has a right upper extremity PICC line. -Currently afebrile and hemodynamically stable  Left wrist/forearm abscess -MRI had shown 2.6 x 1.3 x 3 cm collection concerning for phlegmon versus developing abscess. -Status post I&D by orthopedics on 12/07/2018 with findings of purulent abscess with extension from the wrist into the forearm and necrotic soft tissue/palmaris tendon status post excision -continue iv vancomycin -Nonweightbearing left wrist -Follow-up orthopedics outpatient 1 week postop  Diabetes mellitus type 2 Intermittent hypoglycemia and hyperglycemia -Hemoglobin A1c 7.9 -After decreasing the dose of long-acting insulin and discontinuing short acting insulin on 12/12/2018, her blood sugars are rising again.  Will increase Lantus to 25 units twice daily and add NovoLog 5 units 3 times a day with meals. --Continue CBGs with SSI.  Diarrhea -Continue Imodium as needed.  Use Lomotil as well as needed.  Severe protein calorie malnutrition -Albumin 1.2  -Continue diet as per nutrition recommendations  Essential hypertension -Blood pressure still on the higher side.  Will increase amlodipine to 10 mg daily.  Monitor  Acute on chronic anemia, likely exacerbated by some postop blood loss anemia -Hemoglobin stable today.  Hemoglobin 7.4 at outside hospital and was transfused 1 unit packed red cells -Transfuse if hemoglobin is less than 7. -No labs today.    DVT prophylaxis: Heparin Code Status: Full Family Communication: None at bedside Disposition Plan: SNF once bed is  available  Consultants: Orthopedics/ID  Procedures:  Transtibial amputation right lower extremity 11/30/2018 I&D of left wrist/elbow on 12/07/2018 PICC line placement, right upper extremity 12/04/2018  Antimicrobials:  Vancomycin from 11/28/2018 onwards   Subjective: Patient seen and examined at bedside.  She is sleepy, hardly wakes up on calling her name.  No overnight fever or vomiting reported.  Still having diarrhea. Objective: Vitals:   12/12/18 1253 12/12/18 2052 12/13/18 0628 12/13/18 0638  BP: (!) 157/95 (!) 143/85 (!) 150/89   Pulse: (!) 105 (!) 104 90   Resp:  16 16   Temp: 98.1 F (36.7 C) 98.4 F (36.9 C) 97.8 F (36.6 C)   TempSrc: Oral     SpO2: 100% 100% 98%   Weight:    74.6 kg  Height:        Intake/Output Summary (Last 24 hours) at 12/13/2018 0722 Last data filed at 12/13/2018 0547 Gross per 24 hour  Intake 750 ml  Output 1006 ml  Net -256 ml   Filed Weights   12/10/18 0500 12/11/18 0500 12/13/18 0638  Weight: 81.1 kg 81 kg 74.6 kg    Examination:  General exam: Appears calm and comfortable.  No distress.  Hardly wakes up on calling her name. Respiratory system: Bilateral decreased breath sounds at bases with some scattered crackles Cardiovascular system: S1-S2 heard, rate controlled Gastrointestinal system: Abdomen is nondistended, soft and nontender. Normal bowel sounds heard. Extremities: No cyanosis, edema.  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  Left wrist dressing present.  Data Reviewed: I have personally reviewed following labs and imaging studies  CBC: Recent Labs  Lab 12/07/18 0422 12/08/18 0457 12/09/18 0423 12/10/18 0021 12/12/18 0355  WBC 13.6* 17.5* 12.0* 10.9* 9.6  HGB 7.0* 7.5* 7.0* 8.2* 8.6*  HCT 22.3* 23.8* 22.7* 25.5* 27.5*  MCV 98.7 97.5 99.1 96.6 99.6  PLT 377 414* 367 362 351   Basic Metabolic Panel: Recent Labs  Lab 12/07/18 0422 12/08/18 0457 12/09/18 0423 12/10/18 0021 12/12/18 0355  NA 139 135 139 138 138  K 3.5  3.8 4.0 3.8 3.5  CL 110 101 106 104 99  CO2 24 25 27 29 30   GLUCOSE 150* 326* 198* 214* 211*  BUN 8 10 8 10 13   CREATININE 0.41* 0.50 0.51 0.48 0.59  CALCIUM 6.9* 7.2* 7.7* 8.2* 9.0  MG 1.5* 1.6*  --   --   --    GFR: Estimated Creatinine Clearance: 94.6 mL/min (by C-G formula based on SCr of 0.59 mg/dL). Liver Function Tests: No results for input(s): AST, ALT, ALKPHOS, BILITOT, PROT, ALBUMIN in the last 168 hours. No results for input(s): LIPASE, AMYLASE in the last 168 hours. No results for input(s): AMMONIA in the last 168 hours. Coagulation Profile: No results for input(s): INR, PROTIME in the last 168 hours. Cardiac Enzymes: No results for input(s): CKTOTAL, CKMB, CKMBINDEX, TROPONINI in the last 168 hours. BNP (last 3 results) No results for input(s): PROBNP in the last 8760 hours. HbA1C: No results for input(s): HGBA1C in the last 72 hours. CBG: Recent Labs  Lab 12/12/18 0748 12/12/18 0854 12/12/18 1114 12/12/18 1655 12/12/18 2053  GLUCAP 123* 126* 107* 195* 347*   Lipid Profile: No results for input(s): CHOL, HDL, LDLCALC, TRIG, CHOLHDL, LDLDIRECT in the last 72 hours. Thyroid Function Tests: No results for input(s): TSH, T4TOTAL, FREET4, T3FREE, THYROIDAB in the last 72 hours. Anemia Panel: No results for input(s): VITAMINB12, FOLATE, FERRITIN, TIBC, IRON, RETICCTPCT in the last 72 hours. Sepsis Labs: No results  for input(s): PROCALCITON, LATICACIDVEN in the last 168 hours.  Recent Results (from the past 240 hour(s))  Aerobic/Anaerobic Culture (surgical/deep wound)     Status: None   Collection Time: 12/07/18 12:49 PM  Result Value Ref Range Status   Specimen Description ABSCESS  Final   Special Requests NONE  Final   Gram Stain   Final    RARE WBC PRESENT, PREDOMINANTLY MONONUCLEAR NO ORGANISMS SEEN    Culture   Final    RARE METHICILLIN RESISTANT STAPHYLOCOCCUS AUREUS NO ANAEROBES ISOLATED Performed at [facility], [address]  [place], [place] [address]    Report Status 12/12/2018 FINAL  Final   Organism ID, Bacteria METHICILLIN RESISTANT STAPHYLOCOCCUS AUREUS  Final      Susceptibility   Methicillin resistant staphylococcus aureus - MIC*    CIPROFLOXACIN >=8 RESISTANT Resistant     ERYTHROMYCIN >=8 RESISTANT Resistant     GENTAMICIN <=0.5 SENSITIVE Sensitive     OXACILLIN >=4 RESISTANT Resistant     TETRACYCLINE <=1 SENSITIVE Sensitive     VANCOMYCIN 1 SENSITIVE Sensitive     TRIMETH/SULFA <=10 SENSITIVE Sensitive     CLINDAMYCIN >=8 RESISTANT Resistant     RIFAMPIN <=0.5 SENSITIVE Sensitive     Inducible Clindamycin NEGATIVE Sensitive     * RARE METHICILLIN RESISTANT STAPHYLOCOCCUS AUREUS         Radiology Studies: No results found.      Scheduled Meds: . amLODipine  5 mg Oral Daily  . docusate sodium  100 mg Oral BID  . heparin injection (subcutaneous)  5,000 Units Subcutaneous Q8H  . insulin aspart  0-15 Units Subcutaneous TID WC  . insulin aspart  0-5 Units Subcutaneous QHS  . insulin glargine  20 Units Subcutaneous BID  . multivitamin with minerals  1 tablet Oral Daily  . Ensure Max Protein  11 oz Oral Daily   Continuous Infusions: . sodium chloride 250 mL (11/30/18 0650)  . sodium chloride 10 mL/hr at 12/04/18 2231  . lactated ringers 10 mL/hr at 12/07/18 1121  . vancomycin 750 mg (12/12/18 2252)     LOS: 15 days        Glade LloydKshitiz Irene Collings, MD [facility] 12/13/2018, 7:22 AM

## 2018-12-13 NOTE — Progress Notes (Signed)
Pharmacy Antibiotic Note  Yolanda [name] is a 43 y.o. female admitted on 11/28/2018 with diabetic foot infection.  Pharmacy has been consulted for vancomycin dosing. 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 Per MD note, she failed daptomycin and was transitioned to vancomycin (which was supposed to complete on 11/20/2018). 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     6/1: AUC on 1gm q12 is 584.2.  Will adjust dose to 750 mg IV q12 for AUC 437.9  6/4 AM update: AUC is therapeutic this AM at 474, renal function stable  Plan: Cont vancomycin 750 mg IV q12h Continue treatment through 12/25/18 Re-check levels as needed   Height: 5\' 6"  (167.6 cm) Weight: 178 lb 9.2 oz (81 kg) IBW/kg (Calculated) : 59.3  Temp (24hrs), Avg:98.1 F (36.7 C), Min:97.8 F (36.6 C), Max:98.4 F (36.9 C)  Recent Labs  Lab 12/07/18 0422 12/08/18 0457 12/09/18 0423 12/09/18 1315 12/10/18 0008 12/10/18 0021 12/12/18 0355 12/12/18 1345 12/12/18 2245  WBC 13.6* 17.5* 12.0*  --   --  10.9* 9.6  --   --   CREATININE 0.41* 0.50 0.51  --   --  0.48 0.59  --   --   VANCOTROUGH  --   --   --   --  12*  --   --   --  11*  VANCOPEAK  --   --   --  32  --   --   --  23*  --     Estimated Creatinine Clearance: 98.3 mL/min (by C-G formula based on SCr of 0.59 mg/dL).    No Known Allergies  Abran Duke, PharmD, BCPS Clinical Pharmacist Phone: [phone]

## 2018-12-14 LAB — BASIC METABOLIC PANEL
Anion gap: 6 (ref 5–15)
BUN: 11 mg/dL (ref 6–20)
CO2: 30 mmol/L (ref 22–32)
Calcium: 8.9 mg/dL (ref 8.9–10.3)
Chloride: 103 mmol/L (ref 98–111)
Creatinine, Ser: 0.54 mg/dL (ref 0.44–1.00)
GFR calc Af Amer: >60 mL/min (ref >60)
GFR calc non Af Amer: >60 mL/min (ref >60)
Glucose, Bld: 300 mg/dL — ABNORMAL HIGH (ref 70–99)
Potassium: 3.9 mmol/L (ref 3.5–5.1)
Sodium: 139 mmol/L (ref 135–145)

## 2018-12-14 LAB — CBC WITH DIFFERENTIAL/PLATELET
Abs Immature Granulocytes: 0.03 10*3/uL (ref 0.00–0.07)
Basophils Absolute: 0 10*3/uL (ref 0.0–0.1)
Basophils Relative: 0 %
Eosinophils Absolute: 0.2 10*3/uL (ref 0.0–0.5)
Eosinophils Relative: 2 %
HCT: 27.8 % — ABNORMAL LOW (ref 36.0–46.0)
Hemoglobin: 8.6 g/dL — ABNORMAL LOW (ref 12.0–15.0)
Immature Granulocytes: 0 %
Lymphocytes Relative: 19 %
Lymphs Abs: 1.7 10*3/uL (ref 0.7–4.0)
MCH: 31.4 pg (ref 26.0–34.0)
MCHC: 30.9 g/dL (ref 30.0–36.0)
MCV: 101.5 fL — ABNORMAL HIGH (ref 80.0–100.0)
Monocytes Absolute: 0.5 10*3/uL (ref 0.1–1.0)
Monocytes Relative: 6 %
Neutro Abs: 6.6 10*3/uL (ref 1.7–7.7)
Neutrophils Relative %: 73 %
Platelets: 293 10*3/uL (ref 150–400)
RBC: 2.74 MIL/uL — ABNORMAL LOW (ref 3.87–5.11)
RDW: 19.5 % — ABNORMAL HIGH (ref 11.5–15.5)
WBC: 9 10*3/uL (ref 4.0–10.5)
nRBC: 0 % (ref 0.0–0.2)

## 2018-12-14 LAB — GLUCOSE, CAPILLARY
Glucose-Capillary: 325 mg/dL — ABNORMAL HIGH (ref 70–99)
Glucose-Capillary: 158 mg/dL — ABNORMAL HIGH (ref 70–99)
Glucose-Capillary: 135 mg/dL — ABNORMAL HIGH (ref 70–99)
Glucose-Capillary: 140 mg/dL — ABNORMAL HIGH (ref 70–99)

## 2018-12-14 LAB — MAGNESIUM: Magnesium: 1.7 mg/dL (ref 1.7–2.4)

## 2018-12-14 MED ORDER — INSULIN GLARGINE 100 UNIT/ML ~~LOC~~ SOLN
30.0000 [IU] | Freq: Two times a day (BID) | SUBCUTANEOUS | Status: DC
  Administered 2018-12-14 – 2018-12-15 (×3): 30 [IU] via SUBCUTANEOUS
  Filled 2018-12-14 (×5): qty 0.3

## 2018-12-14 MED ORDER — INSULIN ASPART 100 UNIT/ML ~~LOC~~ SOLN
8.0000 [IU] | Freq: Three times a day (TID) | SUBCUTANEOUS | Status: DC
  Administered 2018-12-14 (×3): 8 [IU] via SUBCUTANEOUS

## 2018-12-14 NOTE — Progress Notes (Signed)
Patient ID: Yolanda [name], female   DOB: [birth year], 43 y.o.   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  She presented on 11/28/2018 with worsening pain and swelling of her right foot.  CT scan showed destruction of the entire mid and hindfoot with fluid and gas collections with surrounding cellulitis.  She was transferred to [facility] from [facility].  She was started on intravenous antibiotics.  ID and orthopedics were consulted.  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  ID recommends IV vancomycin till 12/25/2018.  She is waiting for SNF placement.  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)   Severe protein-calorie malnutrition (HCC)   Diabetic polyneuropathy associated with type 2 diabetes mellitus (HCC)   Cigarette smoker   Subacute osteomyelitis, right ankle and foot (HCC)   Cutaneous abscess of right foot   Abscess of bursa, left wrist   Abscess of left hand  Sepsis: Present on admission MRSA bacteremia in a patient with history of recent MRSA bacteremia with Diabetic right foot infection/osteomyelitis -Patient was transferred from [facility] with CT of the foot showing destruction of the entire midfoot and hindfoot with fluid and gas collections and surrounding cellulitis. 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  Wound care as per orthopedics recommendations.  Outpatient follow-up with orthopedics/Dr. Lajoyce Corners.  -Sepsis has resolved.  Currently hemodynamically stable. -Blood culture on presentation was positive for MRSA.  Repeat blood cultures from 11/30/2018 have been negative so far. -TEE on 11/30/2018 showed no evidence of endocarditis -ID recommended 4 weeks of IV vancomycin through 12/25/2018.  Goal vancomycin trough of 15-20.  Pharmacy is monitoring and dosing vancomycin.  Monitor labs intermittently -Currently has a right upper extremity PICC line. -No recent temperature spikes.  No current leukocytosis.  Left wrist/forearm abscess -MRI had shown 2.6 x 1.3 x 3 cm collection concerning for phlegmon versus developing abscess. -Status post I&D by orthopedics on 12/07/2018 with findings of purulent abscess with extension from the wrist into the forearm and necrotic soft tissue/palmaris tendon status post excision -continue iv vancomycin -Nonweightbearing left wrist -Follow-up orthopedics outpatient 1 week postop  Diabetes mellitus type 2 Intermittent hypoglycemia and hyperglycemia -Hemoglobin A1c 7.9 -Blood sugars are increasing again.  increase Lantus to 30 units twice daily.  Increase NovoLog to 8 units 3 times daily with meals -Continue CBGs with SSI.  Diarrhea -Continue Imodium as needed.  Use Lomotil as well as needed.  Severe protein calorie malnutrition -Continue diet as per nutrition recommendations  Essential hypertension -Blood pressure improving.  Continue amlodipine 10 mg daily.  Monitor  Acute on chronic anemia, likely exacerbated by some postop blood loss anemia -Hemoglobin stable today.  Hemoglobin 7.4 at outside hospital and was transfused 1 unit packed red cells -Transfuse if hemoglobin is less than 7. -Hemoglobin 8.6 today.   DVT prophylaxis: Heparin Code Status: Full Family Communication: None at bedside Disposition Plan: SNF once bed is available  Consultants: Orthopedics/ID  Procedures:  Transtibial amputation right lower extremity 11/30/2018 I&D of left  wrist/elbow on 12/07/2018 PICC line placement,  right upper extremity 12/04/2018  Antimicrobials:  Vancomycin from 11/28/2018 onwards   Subjective: Patient seen and examined at bedside.  More awake today.  Poor historian.  No overnight fever, vomiting reported by nursing staff.  Still having diarrhea. Objective: Vitals:   12/13/18 0638 12/13/18 1351 12/13/18 2134 12/14/18 0640  BP:  (!) 144/80 113/65 140/80  Pulse:  94 (!) 103 97  Resp:  Temp:  98.2 F (36.8 C) 98.2 F (36.8 C) 97.7 F (36.5 C)  TempSrc:  Oral  Oral  SpO2:  100% 99% 100%  Weight: 74.6 kg   74.2 kg  Height:        Intake/Output Summary (Last 24 hours) at 12/14/2018 0736 Last data filed at 12/14/2018 0648 Gross per 24 hour  Intake 1077.33 ml  Output 2000 ml  Net -922.67 ml   Filed Weights   12/11/18 0500 12/13/18 0638 12/14/18 0640  Weight: 81 kg 74.6 kg 74.2 kg    Examination:  General exam: No acute distress.  Looks older than stated age.  More awake this morning and answers some questions.  Poor historian. Respiratory system: Bilateral decreased breath sounds at bases with some scattered crackles.  No wheezing Cardiovascular system: S1-S2 heard, intermittent tachycardia  gastrointestinal system: Abdomen is nondistended, soft and nontender. Normal bowel sounds heard. Extremities: No cyanosis, edema.  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  Left wrist dressing present.  Data Reviewed: I have personally reviewed following labs and imaging studies  CBC: Recent Labs  Lab 12/08/18 0457 12/09/18 0423 12/10/18 0021 12/12/18 0355 12/14/18 0430  WBC 17.5* 12.0* 10.9* 9.6 9.0  NEUTROABS  --   --   --   --  6.6  HGB 7.5* 7.0* 8.2* 8.6* 8.6*  HCT 23.8* 22.7* 25.5* 27.5* 27.8*  MCV 97.5 99.1 96.6 99.6 101.5*  PLT 414* 367 362 351 293   Basic Metabolic Panel: Recent Labs  Lab 12/08/18 0457 12/09/18 0423 12/10/18 0021 12/12/18 0355 12/14/18 0430  NA 135 139 138 138 139  K 3.8 4.0 3.8 3.5 3.9  CL 101  106 104 99 103  CO2 GLUCOSE 326* 198* 214* 211* 300*  BUN CREATININE 0.50 0.51 0.48 0.59 0.54  CALCIUM 7.2* 7.7* 8.2* 9.0 8.9  MG 1.6*  --   --   --  1.7   GFR: Estimated Creatinine Clearance: 94.4 mL/min (by C-G formula based on SCr of 0.54 mg/dL). Liver Function Tests: No results for input(s): AST, ALT, ALKPHOS, BILITOT, PROT, ALBUMIN in the last 168 hours. No results for input(s): LIPASE, AMYLASE in the last 168 hours. No results for input(s): AMMONIA in the last 168 hours. Coagulation Profile: No results for input(s): INR, PROTIME in the last 168 hours. Cardiac Enzymes: No results for input(s): CKTOTAL, CKMB, CKMBINDEX, TROPONINI in the last 168 hours. BNP (last 3 results) No results for input(s): PROBNP in the last 8760 hours. HbA1C: No results for input(s): HGBA1C in the last 72 hours. CBG: Recent Labs  Lab 12/12/18 2053 12/13/18 0742 12/13/18 1159 12/13/18 1653 12/13/18 2135  GLUCAP 347* 178* 187* 232* 279*   Lipid Profile: No results for input(s): CHOL, HDL, LDLCALC, TRIG, CHOLHDL, LDLDIRECT in the last 72 hours. Thyroid Function Tests: No results for input(s): TSH, T4TOTAL, FREET4, T3FREE, THYROIDAB in the last 72 hours. Anemia Panel: No results for input(s): VITAMINB12, FOLATE, FERRITIN, TIBC, IRON, RETICCTPCT in the last 72 hours. Sepsis Labs: No results for  input(s): PROCALCITON, LATICACIDVEN in the last 168 hours.  Recent Results (from the past 240 hour(s))  Aerobic/Anaerobic Culture (surgical/deep wound)     Status: None   Collection Time: 12/07/18 12:49 PM  Result Value Ref Range Status   Specimen Description ABSCESS  Final   Special Requests NONE  Final   Gram Stain   Final    RARE WBC PRESENT, PREDOMINANTLY MONONUCLEAR NO ORGANISMS SEEN    Culture   Final    RARE METHICILLIN RESISTANT STAPHYLOCOCCUS AUREUS NO ANAEROBES ISOLATED Performed at [facility], [address] [place], [place] [address]    Report  Status 12/12/2018 FINAL  Final   Organism ID, Bacteria METHICILLIN RESISTANT STAPHYLOCOCCUS AUREUS  Final      Susceptibility   Methicillin resistant staphylococcus aureus - MIC*    CIPROFLOXACIN >=8 RESISTANT Resistant     ERYTHROMYCIN >=8 RESISTANT Resistant     GENTAMICIN <=0.5 SENSITIVE Sensitive     OXACILLIN >=4 RESISTANT Resistant     TETRACYCLINE <=1 SENSITIVE Sensitive     VANCOMYCIN 1 SENSITIVE Sensitive     TRIMETH/SULFA <=10 SENSITIVE Sensitive     CLINDAMYCIN >=8 RESISTANT Resistant     RIFAMPIN <=0.5 SENSITIVE Sensitive     Inducible Clindamycin NEGATIVE Sensitive     * RARE METHICILLIN RESISTANT STAPHYLOCOCCUS AUREUS         Radiology Studies: No results found.      Scheduled Meds: . amLODipine  10 mg Oral Daily  . docusate sodium  100 mg Oral BID  . heparin injection (subcutaneous)  5,000 Units Subcutaneous Q8H  . insulin aspart  0-15 Units Subcutaneous TID WC  . insulin aspart  0-5 Units Subcutaneous QHS  . insulin aspart  5 Units Subcutaneous TID WC  . insulin glargine  25 Units Subcutaneous BID  . multivitamin with minerals  1 tablet Oral Daily  . Ensure Max Protein  11 oz Oral Daily   Continuous Infusions: . sodium chloride 250 mL (11/30/18 0650)  . sodium chloride 10 mL/hr at 12/04/18 2231  . lactated ringers 10 mL/hr at 12/07/18 1121  . vancomycin 750 mg (12/13/18 2145)     LOS: 16 days        Glade LloydKshitiz Pihu Basil, MD [facility] 12/14/2018, 7:36 AM

## 2018-12-14 NOTE — Progress Notes (Signed)
Subjective: 7 Days Post-Op Procedure(s) (LRB): IRRIGATION AND DEBRIDEMENT LEFT WRIST (Left) Patient reports pain as mild.    Objective: Vital signs in last 24 hours: Temp:  [97.7 F (36.5 C)-98.2 F (36.8 C)] 97.7 F (36.5 C) (06/05 0640) Pulse Rate:  [94-103] 97 (06/05 0640) Resp:  [14-18] 16 (06/05 0640) BP: (113-144)/(65-80) 140/80 (06/05 0640) SpO2:  [99 %-100 %] 100 % (06/05 0640) Weight:  [74.2 kg] 74.2 kg (06/05 0640)  Intake/Output from previous day: 06/04 0701 - 06/05 0700 In: 1077.3 [P.O.:240; I.V.:687.3; IV Piggyback:150] Out: 2000 [Urine:2000] Intake/Output this shift: Total I/O In: 444 [P.O.:444] Out: 1000 [Urine:1000]  Recent Labs    12/12/18 0355 12/14/18 0430  HGB 8.6* 8.6*   Recent Labs    12/12/18 0355 12/14/18 0430  WBC 9.6 9.0  RBC 2.76* 2.74*  HCT 27.5* 27.8*  PLT 351 293   Recent Labs    12/12/18 0355 12/14/18 0430  NA 138 139  K 3.5 3.9  CL 99 103  CO2 30 30  BUN 13 11  CREATININE 0.59 0.54  GLUCOSE 211* 300*  CALCIUM 9.0 8.9   No results for input(s): LABPT, INR in the last 72 hours.  VAC dressing removed from the right BKA site and the incision is intact and healing well. Okay to apply the black shrinker stocking directly over the incision.  The left wrist incision is clean, dry and intact. Sutures in place. No signs of infection.    Assessment/Plan: 7 Days Post-Op Procedure(s) (LRB): IRRIGATION AND DEBRIDEMENT LEFT WRIST (Left) R BKA- Shrinker stocking and limb protector while up.  L wrist daily dressing changes.  Plan follow up in the office for suture and staple removal.    Develle Sievers, PA-C 12/14/2018, 10:19 AM  [facility] [phone]

## 2018-12-15 LAB — GLUCOSE, CAPILLARY
Glucose-Capillary: 87 mg/dL (ref 70–99)
Glucose-Capillary: 192 mg/dL — ABNORMAL HIGH (ref 70–99)
Glucose-Capillary: 203 mg/dL — ABNORMAL HIGH (ref 70–99)
Glucose-Capillary: 189 mg/dL — ABNORMAL HIGH (ref 70–99)

## 2018-12-15 MED ORDER — INSULIN GLARGINE 100 UNIT/ML ~~LOC~~ SOLN
20.0000 [IU] | Freq: Two times a day (BID) | SUBCUTANEOUS | Status: DC
  Administered 2018-12-15 – 2018-12-17 (×5): 20 [IU] via SUBCUTANEOUS
  Filled 2018-12-15 (×8): qty 0.2

## 2018-12-15 MED ORDER — INSULIN ASPART 100 UNIT/ML ~~LOC~~ SOLN
5.0000 [IU] | Freq: Three times a day (TID) | SUBCUTANEOUS | Status: DC
  Administered 2018-12-15 – 2018-12-25 (×30): 5 [IU] via SUBCUTANEOUS

## 2018-12-15 MED ORDER — DIPHENHYDRAMINE HCL 25 MG PO CAPS
25.0000 mg | ORAL_CAPSULE | Freq: Once | ORAL | Status: AC
  Administered 2018-12-15: 25 mg via ORAL
  Filled 2018-12-15: qty 1

## 2018-12-15 NOTE — Progress Notes (Signed)
Patient ID: Yolanda [name], female   DOB: 12-Jun-1976, 43 y.o.   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  She presented on 11/28/2018 with worsening pain and swelling of her right foot.  CT scan showed destruction of the entire mid and hindfoot with fluid and gas collections with surrounding cellulitis.  [facility].  She was started on intravenous antibiotics.  ID and orthopedics were consulted.  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  ID recommends IV vancomycin till 12/25/2018.  She is waiting for SNF placement.  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)   Severe protein-calorie malnutrition (HCC)   Diabetic polyneuropathy associated with type 2 diabetes mellitus (HCC)   Cigarette smoker   Subacute osteomyelitis, right ankle and foot (HCC)   Cutaneous abscess of right foot   Abscess of bursa, left wrist   Abscess of left hand  Sepsis: Present on admission MRSA bacteremia in a patient with history of recent MRSA bacteremia with Diabetic right foot infection/osteomyelitis -Patient was [facility] with CT of the foot showing destruction of the entire midfoot and hindfoot with fluid and gas collections and surrounding cellulitis. 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  Wound care as per orthopedics recommendations.  Outpatient follow-up with orthopedics/Dr. Lajoyce Cornersuda.  -Sepsis has resolved.  Currently hemodynamically stable. -Blood culture on presentation was positive for MRSA.  Repeat blood cultures from 11/30/2018 have been negative so far. -TEE on 11/30/2018 showed no evidence of endocarditis -ID recommended 4 weeks of IV vancomycin through 12/25/2018.  Goal vancomycin trough of 15-20.  Pharmacy is monitoring and dosing vancomycin.  Monitor labs intermittently -Currently has a right upper extremity PICC line. -No recent temperature spikes.  No current leukocytosis.  Left wrist/forearm abscess -MRI had shown 2.6 x 1.3 x 3 cm collection concerning for phlegmon versus developing abscess. -Status post I&D by orthopedics on 12/07/2018 with findings of purulent abscess with extension from the wrist into the forearm and necrotic soft tissue/palmaris tendon status post excision -continue iv vancomycin -Nonweightbearing left wrist -Follow-up orthopedics outpatient 1 week postop  Diabetes mellitus type 2 Intermittent hypoglycemia and hyperglycemia -Hemoglobin A1c 7.9 -Blood sugars are again on the lower side.  Decrease Lantus to 20 units twice daily and increase NovoLog to 5 units 3 times a day with meals. -Continue CBGs with SSI.  Diarrhea -Continue Imodium as needed.  Use Lomotil as well as needed.  Severe protein calorie malnutrition -Continue diet as per nutrition recommendations  Essential hypertension -Blood pressure improving.  Continue amlodipine 10 mg daily.  Monitor  Acute on chronic anemia, likely exacerbated by some postop blood loss anemia -Hemoglobin stable today.  Hemoglobin 7.4 at outside hospital and was transfused 1 unit packed red cells -Transfuse if hemoglobin is less than 7.   DVT prophylaxis: Heparin Code Status: Full Family Communication: None at bedside Disposition Plan: SNF once bed is available  Consultants: Orthopedics/ID  Procedures:  Transtibial amputation right lower extremity 11/30/2018 I&D of left wrist/elbow on  12/07/2018 PICC line  placement, right upper extremity 12/04/2018  Antimicrobials:  Vancomycin from 11/28/2018 onwards   Subjective: Patient seen and examined at bedside.  Sleepy, wakes up only very slightly.  Does not answer questions.  Still having diarrhea as per nursing staff.  No overnight fever or vomiting.   Objective: Vitals:   12/14/18 2323 12/15/18 0536 12/15/18 0549 12/15/18 0557  BP: (!) 134/99  138/78 138/78  Pulse: (!) 104  96 95  Resp: 16   18  Temp: 98.2 F (36.8 C)  (!) 97.5 F (36.4 C) (!) 97.5 F (36.4 C)  TempSrc: Oral  Oral Oral  SpO2: 99%  100% 100%  Weight:  68.8 kg    Height:        Intake/Output Summary (Last 24 hours) at 12/15/2018 1014 Last data filed at 12/14/2018 2333 Gross per 24 hour  Intake -  Output 800 ml  Net -800 ml   Filed Weights   12/13/18 0638 12/14/18 0640 12/15/18 0536  Weight: 74.6 kg 74.2 kg 68.8 kg    Examination:  General exam: No acute distress.  Looks older than stated age.  Sleepy, hardly wakes up on calling her name  respiratory system: Bilateral decreased breath sounds at bases with some scattered crackles.  Cardiovascular system: S1-S2 heard, still has intermittent tachycardia  gastrointestinal system: Abdomen is nondistended, soft and nontender. Normal bowel sounds heard. Extremities: No cyanosis, edema.  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  Left wrist dressing present.  Data Reviewed: I have personally reviewed following labs and imaging studies  CBC: Recent Labs  Lab 12/09/18 0423 12/10/18 0021 12/12/18 0355 12/14/18 0430  WBC 12.0* 10.9* 9.6 9.0  NEUTROABS  --   --   --  6.6  HGB 7.0* 8.2* 8.6* 8.6*  HCT 22.7* 25.5* 27.5* 27.8*  MCV 99.1 96.6 99.6 101.5*  PLT 367 362 351 242   Basic Metabolic Panel: Recent Labs  Lab 12/09/18 0423 12/10/18 0021 12/12/18 0355 12/14/18 0430  NA 139 138 138 139  K 4.0 3.8 3.5 3.9  CL 106 104 99 103  CO2 27 29 30 30   GLUCOSE 198* 214* 211* 300*  BUN 8 10 13 11   CREATININE  0.51 0.48 0.59 0.54  CALCIUM 7.7* 8.2* 9.0 8.9  MG  --   --   --  1.7   GFR: Estimated Creatinine Clearance: 85.8 mL/min (by C-G formula based on SCr of 0.54 mg/dL). Liver Function Tests: No results for input(s): AST, ALT, ALKPHOS, BILITOT, PROT, ALBUMIN in the last 168 hours. No results for input(s): LIPASE, AMYLASE in the last 168 hours. No results for input(s): AMMONIA in the last 168 hours. Coagulation Profile: No results for input(s): INR, PROTIME in the last 168 hours. Cardiac Enzymes: No results for input(s): CKTOTAL, CKMB, CKMBINDEX, TROPONINI in the last 168 hours. BNP (last 3 results) No results for input(s): PROBNP in the last 8760 hours. HbA1C: No results for input(s): HGBA1C in the last 72 hours. CBG: Recent Labs  Lab 12/14/18 0926 12/14/18 1212 12/14/18 1702 12/14/18 2319 12/15/18 0816  GLUCAP 325* 158* 135* 140* 87   Lipid Profile: No results for input(s): CHOL, HDL, LDLCALC, TRIG, CHOLHDL, LDLDIRECT in the last 72 hours. Thyroid Function Tests: No results for input(s): TSH, T4TOTAL, FREET4, T3FREE, THYROIDAB in the last 72 hours. Anemia Panel: No results for input(s): VITAMINB12, FOLATE, FERRITIN, TIBC, IRON, RETICCTPCT in the last 72 hours. Sepsis Labs: No results for input(s): PROCALCITON, LATICACIDVEN in the last 168 hours.  Recent Results (from the past  240 hour(s))  Aerobic/Anaerobic Culture (surgical/deep wound)     Status: None   Collection Time: 12/07/18 12:49 PM  Result Value Ref Range Status   Specimen Description ABSCESS  Final   Special Requests NONE  Final   Gram Stain   Final    RARE WBC PRESENT, PREDOMINANTLY MONONUCLEAR NO ORGANISMS SEEN    Culture   Final    RARE METHICILLIN RESISTANT STAPHYLOCOCCUS AUREUS NO ANAEROBES ISOLATED Performed at [facility], [address] [place], [place] [address]    Report Status 12/12/2018 FINAL  Final   Organism ID, Bacteria METHICILLIN RESISTANT STAPHYLOCOCCUS AUREUS  Final       Susceptibility   Methicillin resistant staphylococcus aureus - MIC*    CIPROFLOXACIN >=8 RESISTANT Resistant     ERYTHROMYCIN >=8 RESISTANT Resistant     GENTAMICIN <=0.5 SENSITIVE Sensitive     OXACILLIN >=4 RESISTANT Resistant     TETRACYCLINE <=1 SENSITIVE Sensitive     VANCOMYCIN 1 SENSITIVE Sensitive     TRIMETH/SULFA <=10 SENSITIVE Sensitive     CLINDAMYCIN >=8 RESISTANT Resistant     RIFAMPIN <=0.5 SENSITIVE Sensitive     Inducible Clindamycin NEGATIVE Sensitive     * RARE METHICILLIN RESISTANT STAPHYLOCOCCUS AUREUS         Radiology Studies: No results found.      Scheduled Meds: . amLODipine  10 mg Oral Daily  . docusate sodium  100 mg Oral BID  . heparin injection (subcutaneous)  5,000 Units Subcutaneous Q8H  . insulin aspart  0-15 Units Subcutaneous TID WC  . insulin aspart  0-5 Units Subcutaneous QHS  . insulin aspart  8 Units Subcutaneous TID WC  . insulin glargine  30 Units Subcutaneous BID  . multivitamin with minerals  1 tablet Oral Daily  . Ensure Max Protein  11 oz Oral Daily   Continuous Infusions: . sodium chloride 250 mL (11/30/18 0650)  . sodium chloride 10 mL/hr at 12/04/18 2231  . lactated ringers 10 mL/hr at 12/07/18 1121  . vancomycin 750 mg (12/14/18 2231)     LOS: 17 days        Glade LloydKshitiz Reza Crymes, MD [facility] 12/15/2018, 10:14 AM

## 2018-12-16 LAB — CBC WITH DIFFERENTIAL/PLATELET
Abs Immature Granulocytes: 0.03 10*3/uL (ref 0.00–0.07)
Basophils Absolute: 0.1 10*3/uL (ref 0.0–0.1)
Basophils Relative: 1 %
Eosinophils Absolute: 0.1 10*3/uL (ref 0.0–0.5)
Eosinophils Relative: 2 %
HCT: 28.3 % — ABNORMAL LOW (ref 36.0–46.0)
Hemoglobin: 8.9 g/dL — ABNORMAL LOW (ref 12.0–15.0)
Immature Granulocytes: 0 %
Lymphocytes Relative: 22 %
Lymphs Abs: 1.7 10*3/uL (ref 0.7–4.0)
MCH: 31.7 pg (ref 26.0–34.0)
MCHC: 31.4 g/dL (ref 30.0–36.0)
MCV: 100.7 fL — ABNORMAL HIGH (ref 80.0–100.0)
Monocytes Absolute: 0.5 10*3/uL (ref 0.1–1.0)
Monocytes Relative: 6 %
Neutro Abs: 5.4 10*3/uL (ref 1.7–7.7)
Neutrophils Relative %: 69 %
Platelets: 294 10*3/uL (ref 150–400)
RBC: 2.81 MIL/uL — ABNORMAL LOW (ref 3.87–5.11)
RDW: 18.1 % — ABNORMAL HIGH (ref 11.5–15.5)
WBC: 7.8 10*3/uL (ref 4.0–10.5)
nRBC: 0 % (ref 0.0–0.2)

## 2018-12-16 LAB — BASIC METABOLIC PANEL
Anion gap: 6 (ref 5–15)
BUN: 18 mg/dL (ref 6–20)
CO2: 28 mmol/L (ref 22–32)
Calcium: 9.1 mg/dL (ref 8.9–10.3)
Chloride: 101 mmol/L (ref 98–111)
Creatinine, Ser: 0.57 mg/dL (ref 0.44–1.00)
GFR calc Af Amer: >60 mL/min (ref >60)
GFR calc non Af Amer: >60 mL/min (ref >60)
Glucose, Bld: 305 mg/dL — ABNORMAL HIGH (ref 70–99)
Potassium: 4.1 mmol/L (ref 3.5–5.1)
Sodium: 135 mmol/L (ref 135–145)

## 2018-12-16 LAB — GLUCOSE, CAPILLARY
Glucose-Capillary: 264 mg/dL — ABNORMAL HIGH (ref 70–99)
Glucose-Capillary: 219 mg/dL — ABNORMAL HIGH (ref 70–99)
Glucose-Capillary: 193 mg/dL — ABNORMAL HIGH (ref 70–99)
Glucose-Capillary: 217 mg/dL — ABNORMAL HIGH (ref 70–99)

## 2018-12-16 LAB — MAGNESIUM: Magnesium: 1.7 mg/dL (ref 1.7–2.4)

## 2018-12-16 NOTE — Progress Notes (Signed)
Patient ID: Yolanda [name], female   DOB: [birth year], 43 y.o.   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  She presented on 11/28/2018 with worsening pain and swelling of her right foot.  CT scan showed destruction of the entire mid and hindfoot with fluid and gas collections with surrounding cellulitis.  [facility].  She was started on intravenous antibiotics.  ID and orthopedics were consulted.  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  ID recommends IV vancomycin till 12/25/2018.  She is waiting for SNF placement.  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)   Severe protein-calorie malnutrition (HCC)   Diabetic polyneuropathy associated with type 2 diabetes mellitus (HCC)   Cigarette smoker   Subacute osteomyelitis, right ankle and foot (HCC)   Cutaneous abscess of right foot   Abscess of bursa, left wrist   Abscess of left hand  Sepsis: Present on admission MRSA bacteremia in a patient with history of recent MRSA bacteremia with Diabetic right foot infection/osteomyelitis -Patient was transferred from [facility] with CT of the foot showing destruction of the entire midfoot and hindfoot with fluid and gas collections and surrounding cellulitis. 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  Wound care as per orthopedics recommendations.  Outpatient follow-up with orthopedics/Dr. Lajoyce Cornersuda.  -Sepsis has resolved.  Currently hemodynamically stable. -Blood culture on presentation was positive for MRSA.  Repeat blood cultures from 11/30/2018 have been negative so far. -TEE on 11/30/2018 showed no evidence of endocarditis -ID recommended 4 weeks of IV vancomycin through 12/25/2018.  Goal vancomycin trough of 15-20.  Pharmacy is monitoring and dosing vancomycin.  Monitor labs intermittently -Currently has a right upper extremity PICC line. -No recent fevers.  No current leukocytosis.  Currently stable for discharge.  Left wrist/forearm abscess -MRI had shown 2.6 x 1.3 x 3 cm collection concerning for phlegmon versus developing abscess. -Status post I&D by orthopedics on 12/07/2018 with findings of purulent abscess with extension from the wrist into the forearm and necrotic soft tissue/palmaris tendon status post excision -continue iv vancomycin -Nonweightbearing left wrist -Follow-up orthopedics outpatient 1 week postop  Diabetes mellitus type 2 Intermittent hypoglycemia and hyperglycemia -Hemoglobin A1c 7.9 -Continue current regimen of Lantus along with NovoLog. -Continue CBGs with SSI.  Diarrhea -Continue Imodium as needed.  Use Lomotil as well as needed.  Severe protein calorie malnutrition -Continue diet as per nutrition recommendations  Essential hypertension -Blood pressure improving.  Continue amlodipine 10 mg daily.  Monitor  Acute on chronic anemia, likely exacerbated by some postop blood loss anemia -Hemoglobin stable today.  Hemoglobin 7.4 at outside hospital and was transfused 1 unit packed red cells -Transfuse if hemoglobin is less than 7.   DVT prophylaxis: Heparin Code Status: Full Family Communication: None at bedside Disposition Plan: SNF once bed is available  Consultants: Orthopedics/ID  Procedures:  Transtibial amputation right lower extremity 11/30/2018 I&D of left wrist/elbow on 12/07/2018 PICC line placement, right upper extremity 12/04/2018   Antimicrobials:  Vancomycin from 11/28/2018 onwards   Subjective: Patient  seen and examined at bedside.  Poor historian.  Wakes up slightly, does not answer most questions.  No overnight fever or vomiting reported.  Still having intermittent diarrhea.   Objective: Vitals:   12/15/18 0557 12/15/18 1250 12/15/18 2120 12/16/18 0545  BP: 138/78 116/86 123/79 123/73  Pulse: 95 (!) 106 97 93  Resp: 18 18 18 18   Temp: (!) 97.5 F (36.4 C) 98.3 F (36.8 C) 98.2 F (36.8 C) 98.6 F (37 C)  TempSrc: Oral Oral Oral Oral  SpO2: 100% 100% 100% 99%  Weight:      Height:        Intake/Output Summary (Last 24 hours) at 12/16/2018 0756 Last data filed at 12/16/2018 0128 Gross per 24 hour  Intake 240 ml  Output 900 ml  Net -660 ml   Filed Weights   12/13/18 0638 12/14/18 0640 12/15/18 0536  Weight: 74.6 kg 74.2 kg 68.8 kg    Examination:  General exam: No no distress.  Looks older than stated age.  Sleepy, wakes up slightly.  Poor historian  respiratory system: Bilateral decreased breath sounds at bases with some scattered crackles.  No wheezing Cardiovascular system: S1-S2 heard, rate controlled gastrointestinal system: Abdomen is nondistended, soft and nontender. Normal bowel sounds heard. Extremities: No cyanosis, edema.  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  Left wrist dressing present.  Data Reviewed: I have personally reviewed following labs and imaging studies  CBC: Recent Labs  Lab 12/10/18 0021 12/12/18 0355 12/14/18 0430 12/16/18 0325  WBC 10.9* 9.6 9.0 7.8  NEUTROABS  --   --  6.6 5.4  HGB 8.2* 8.6* 8.6* 8.9*  HCT 25.5* 27.5* 27.8* 28.3*  MCV 96.6 99.6 101.5* 100.7*  PLT 362 351 293 382   Basic Metabolic Panel: Recent Labs  Lab 12/10/18 0021 12/12/18 0355 12/14/18 0430 12/16/18 0325  NA 138 138 139 135  K 3.8 3.5 3.9 4.1  CL 104 99 103 101  CO2 29 30 30 28   GLUCOSE 214* 211* 300* 305*  BUN 10 13 11 18   CREATININE 0.48 0.59 0.54 0.57  CALCIUM 8.2* 9.0 8.9 9.1   MG  --   --  1.7 1.7   GFR: Estimated Creatinine Clearance: 85.8 mL/min (by C-G formula based on SCr of 0.57 mg/dL). Liver Function Tests: No results for input(s): AST, ALT, ALKPHOS, BILITOT, PROT, ALBUMIN in the last 168 hours. No results for input(s): LIPASE, AMYLASE in the last 168 hours. No results for input(s): AMMONIA in the last 168 hours. Coagulation Profile: No results for input(s): INR, PROTIME in the last 168 hours. Cardiac Enzymes: No results for input(s): CKTOTAL, CKMB, CKMBINDEX, TROPONINI in the last 168 hours. BNP (last 3 results) No results for input(s): PROBNP in the last 8760 hours. HbA1C: No results for input(s): HGBA1C in the last 72 hours. CBG: Recent Labs  Lab 12/14/18 2319 12/15/18 0816 12/15/18 1137 12/15/18 1646 12/15/18 2148  GLUCAP 140* 87 192* 203* 189*   Lipid Profile: No results for input(s): CHOL, HDL, LDLCALC, TRIG, CHOLHDL, LDLDIRECT in the last 72 hours. Thyroid Function Tests: No results for input(s): TSH, T4TOTAL, FREET4, T3FREE, THYROIDAB in the last 72 hours. Anemia Panel: No results for input(s): VITAMINB12, FOLATE, FERRITIN, TIBC, IRON, RETICCTPCT in the last 72 hours. Sepsis Labs: No results for input(s): PROCALCITON, LATICACIDVEN in the last 168 hours.  Recent Results (from the past 240 hour(s))  Aerobic/Anaerobic Culture (surgical/deep wound)     Status: None   Collection Time: 12/07/18 12:49 PM  Result Value  Ref Range Status   Specimen Description ABSCESS  Final   Special Requests NONE  Final   Gram Stain   Final    RARE WBC PRESENT, PREDOMINANTLY MONONUCLEAR NO ORGANISMS SEEN    Culture   Final    RARE METHICILLIN RESISTANT STAPHYLOCOCCUS AUREUS NO ANAEROBES ISOLATED Performed at [facility], [address] [place], [place] [address]    Report Status 12/12/2018 FINAL  Final   Organism ID, Bacteria METHICILLIN RESISTANT STAPHYLOCOCCUS AUREUS  Final      Susceptibility   Methicillin resistant staphylococcus  aureus - MIC*    CIPROFLOXACIN >=8 RESISTANT Resistant     ERYTHROMYCIN >=8 RESISTANT Resistant     GENTAMICIN <=0.5 SENSITIVE Sensitive     OXACILLIN >=4 RESISTANT Resistant     TETRACYCLINE <=1 SENSITIVE Sensitive     VANCOMYCIN 1 SENSITIVE Sensitive     TRIMETH/SULFA <=10 SENSITIVE Sensitive     CLINDAMYCIN >=8 RESISTANT Resistant     RIFAMPIN <=0.5 SENSITIVE Sensitive     Inducible Clindamycin NEGATIVE Sensitive     * RARE METHICILLIN RESISTANT STAPHYLOCOCCUS AUREUS         Radiology Studies: No results found.      Scheduled Meds: . amLODipine  10 mg Oral Daily  . docusate sodium  100 mg Oral BID  . heparin injection (subcutaneous)  5,000 Units Subcutaneous Q8H  . insulin aspart  0-15 Units Subcutaneous TID WC  . insulin aspart  0-5 Units Subcutaneous QHS  . insulin aspart  5 Units Subcutaneous TID WC  . insulin glargine  20 Units Subcutaneous BID  . multivitamin with minerals  1 tablet Oral Daily  . Ensure Max Protein  11 oz Oral Daily   Continuous Infusions: . sodium chloride 250 mL (11/30/18 0650)  . sodium chloride 10 mL/hr at 12/04/18 2231  . lactated ringers 10 mL/hr at 12/07/18 1121  . vancomycin 750 mg (12/15/18 2207)     LOS: 18 days        Glade LloydKshitiz Sakinah Rosamond, MD [facility] 12/16/2018, 7:56 AM

## 2018-12-16 NOTE — Progress Notes (Signed)
Occupational Therapy Treatment Patient Details Name: Yolanda [name] MRN: [id number] DOB: 1976-03-16 Today's Date: 12/16/2018    History of present illness Pt is a 43 y.o. F with significant PMH of diabetes type 2, diabetic foot ulcer, who presents with sepsis with right foot infection, osteomyelitis and MRSA bacteremia and presumed endocarditis. 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 Pt now with L hand swelling and pain.     OT comments  Pt progressing toward OT goals. Pt complains of upset stomach limiting full activities in today's session. Pt in engaged in toilet [facility] with Mod A (platform walker) stand pivot. VCs required for hand placement, sequencing, and NWB status. Pt will benefit from continued therapy safety awareness during functional tasks and safe transfers with platform walker. DC and freq remains the same OT will continue to follow acutely.    Follow Up Recommendations  SNF;Supervision/Assistance - 24 hour    Equipment Recommendations  3 in 1 bedside commode    Recommendations for Other Services Other (comment)(Counseling Consult)    Precautions / Restrictions Precautions Precautions: Fall Precaution Comments: R BKA, wound vac Required Braces or Orthoses: Other Brace Other Brace: R residual limb guard Restrictions Weight Bearing Restrictions: Yes RLE Weight Bearing: Non weight bearing Other Position/Activity Restrictions: NWB L wrist per orthopedic note       Mobility Bed Mobility Overal bed mobility: Needs Assistance Bed Mobility: Rolling;Sidelying to Sit;Sit to Sidelying Rolling: Min guard Sidelying to sit: Min guard Supine to sit: Min guard Sit to supine: Supervision   General bed mobility comments: minguard for safety with HOB elevated and use of rail, cueing for NWB of L wrist.  Transfers Overall transfer level: Needs assistance Equipment used: Left platform walker Transfers: Sit to/from Stand Sit to Stand: Mod assist Stand pivot  transfers: Mod assist Squat pivot transfers: (initally max +2 but mod assist with foward lean maintained)          Balance Overall balance assessment: Needs assistance Sitting-balance support: Feet unsupported;No upper extremity supported Sitting balance-[name] Sitting balance - Comments: performed oral care at EOB.   Standing balance support: Bilateral upper extremity supported;During functional activity Standing balance-Leahy Scale: Poor Standing balance comment: relaint on B UE and external support                           ADL either performed or assessed with clinical judgement   ADL Overall ADL's : Needs assistance/impaired Eating/Feeding: Sitting;Set up Eating/Feeding Details (indicate cue type and reason): Pt received in bed with breakfast upon arrival.                      Toilet Transfer: Stand-pivot;BSC;Moderate assistance;Cueing for safety;Cueing for sequencing;RW Toilet Transfer Details (indicate cue type and reason): face to face with +2 HHA Toileting- Clothing Manipulation and Hygiene: Moderate assistance Toileting - Clothing Manipulation Details (indicate cue type and reason): pt [facility] while standing with RW.      Functional mobility during ADLs: Moderate assistance;Cueing for safety;Cueing for sequencing(stand pivot transfer, cueing for safe transitions. ) General ADL Comments: Pt c/o upset stomach, required transfer to BSCx2. Left with nurse in the room.     Vision   Vision Assessment?: No apparent visual deficits   Perception     Praxis      Cognition Arousal/Alertness: Awake/alert Behavior During Therapy: WFL for tasks assessed/performed Overall Cognitive Status: Within Functional Limits for tasks assessed  Area of Impairment: Problem solving;Safety/judgement;Awareness;Memory;Following commands                     Memory: Decreased recall of precautions;Decreased short-term  memory Following Commands: Follows one step commands with increased time Safety/Judgement: Decreased awareness of safety;Decreased awareness of deficits Awareness: Emergent Problem Solving: Requires verbal cues;Difficulty sequencing;Requires tactile cues General Comments: mod cueing to maintain NWB through Lwrist;pt with decreased awareness of deficits and safety requiring cues, pt still slightly impulsive        Exercises     Shoulder Instructions       General Comments Pt able to state NWB status at the start of session for L wrist, however, during ADL required VCs    Pertinent Vitals/ Pain       Pain Assessment: Faces Faces Pain Scale: No hurt Pain Location: L hand  Pain Descriptors / Indicators: Discomfort;Grimacing Pain Intervention(s): Monitored during session  Home Living                                          Prior Functioning/Environment              Frequency  Min 2X/week        Progress Toward Goals  OT Goals(current goals can now be found in the care plan section)  Progress towards OT goals: Progressing toward goals  Acute Rehab OT Goals Patient Stated Goal: to be able to go outside OT Goal Formulation: With patient Time For Goal Achievement: 12/16/18 Potential to Achieve Goals: Good ADL Goals Pt Will Perform Lower Body Bathing: with min assist;sitting/lateral leans;with adaptive equipment Pt Will Perform Lower Body Dressing: with min assist;sitting/lateral leans;sit to/from stand;with adaptive equipment Pt Will Transfer to Toilet: with min assist;bedside commode;squat pivot transfer Pt Will Perform Toileting - Clothing Manipulation and hygiene: with min assist;sitting/lateral leans;sit to/from stand;with adaptive equipment Pt/caregiver will Perform Home Exercise Program: Increased strength;Increased ROM;Both right and left upper extremity;With written HEP provided  Plan Discharge plan remains appropriate;Frequency remains  appropriate    Co-evaluation    PT/OT/SLP Co-Evaluation/Treatment: Yes            AM-PAC OT "6 Clicks" Daily Activity     Outcome Measure   Help from another person eating meals?: None Help from another person taking care of personal grooming?: None Help from another person toileting, which includes using toliet, bedpan, or urinal?: A Lot Help from another person bathing (including washing, rinsing, drying)?: A Lot Help from another person to put on and taking off regular upper body clothing?: A Little Help from another person to put on and taking off regular lower body clothing?: A Lot 6 Click Score: 17    End of Session Equipment Utilized During Treatment: Gait belt  OT Visit Diagnosis: Unsteadiness on feet (R26.81);Muscle weakness (generalized) (M62.81)   Activity Tolerance Patient tolerated treatment well;Other (comment)(Patient limited by upset stomach and reports of diarrhea )   Patient Left with call bell/phone within reach;Other (comment);with nursing/sitter in room(left on [facility] with nurse.)   Nurse Communication Mobility status        Time: 6433-2951 OT Time Calculation (min): 32 min  Charges: OT General Charges $OT Visit: 1 Visit OT Treatments $Self Care/Home Management : 23-37 mins  Minus Breeding, MSOT, OTR/L  Supplemental Rehabilitation Services  [phone]   Marius Ditch 12/16/2018, 9:20 AM

## 2018-12-17 LAB — GLUCOSE, CAPILLARY
Glucose-Capillary: 220 mg/dL — ABNORMAL HIGH (ref 70–99)
Glucose-Capillary: 257 mg/dL — ABNORMAL HIGH (ref 70–99)
Glucose-Capillary: 282 mg/dL — ABNORMAL HIGH (ref 70–99)
Glucose-Capillary: 164 mg/dL — ABNORMAL HIGH (ref 70–99)

## 2018-12-17 MED ORDER — HYDROXYZINE HCL 10 MG/5ML PO SYRP
25.0000 mg | ORAL_SOLUTION | Freq: Once | ORAL | Status: AC
  Administered 2018-12-17: 25 mg via ORAL
  Filled 2018-12-17: qty 12.5

## 2018-12-17 MED ORDER — HYDROXYZINE HCL 25 MG PO TABS
25.0000 mg | ORAL_TABLET | Freq: Once | ORAL | Status: AC
  Administered 2018-12-17: 25 mg via ORAL
  Filled 2018-12-17: qty 1

## 2018-12-17 MED ORDER — DIPHENHYDRAMINE HCL 25 MG PO CAPS
50.0000 mg | ORAL_CAPSULE | Freq: Once | ORAL | Status: AC
  Administered 2018-12-17: 50 mg via ORAL
  Filled 2018-12-17: qty 2

## 2018-12-17 MED ORDER — DIPHENHYDRAMINE HCL 25 MG PO CAPS: 25.0000 mg | ORAL_CAPSULE | ORAL | Status: DC | PRN

## 2018-12-17 NOTE — Progress Notes (Signed)
Patient ID: Yolanda [name], female   DOB: 1976/03/20, 43 y.o.   MRN: [id number]  PROGRESS NOTE    Yolanda [name]  JKD:[id number] DOB: 02/01/1976 DOA: 11/28/2018 PCP: System, Pcp Not In   Brief Narrative:  43 year old female with diabetes mellitus type 2, severe protein calorie malnutrition, diabetic foot ulcer, hospitalization from 10/07/2018-10/15/2018 for MRSA bacteremia with presumed endocarditis with failed daptomycin treatment and subsequently treated with vancomycin with completion on 11/20/2018.  She presented on 11/28/2018 with worsening pain and swelling of her right foot.  CT scan showed destruction of the entire mid and hindfoot with fluid and gas collections with surrounding cellulitis.  She was transferred to [facility].  She was started on intravenous antibiotics.  ID and orthopedics were consulted.  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  ID recommends IV vancomycin till 12/25/2018.  She is waiting for SNF placement.  Assessment & Plan:   Principal Problem:   MRSA bacteremia Active Problems:   Diabetes mellitus type 2, uncontrolled, with complications ([place])   Charcot foot due to diabetes mellitus ([place])   Severe protein-calorie malnutrition ([place])   Diabetic polyneuropathy associated with type 2 diabetes mellitus ([facility])   Cigarette smoker   Subacute osteomyelitis, right ankle and foot ([place])   Cutaneous abscess of right foot   Abscess of bursa, left wrist   Abscess of left hand  Sepsis: Present on admission MRSA bacteremia in a patient with history of recent MRSA bacteremia with Diabetic right foot infection/osteomyelitis -Patient was transferred from [facility] with CT of the foot showing destruction of the entire midfoot and hindfoot with fluid and gas collections and surrounding cellulitis. 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  Wound care as per orthopedics recommendations.  Outpatient follow-up with orthopedics/Dr. Sharol Given.  -Sepsis has resolved.  Currently hemodynamically stable. -Blood culture on presentation was positive for MRSA.  Repeat blood cultures from 11/30/2018 have been negative so far. -TEE on 11/30/2018 showed no evidence of endocarditis -ID recommended 4 weeks of IV vancomycin through 12/25/2018.  Goal vancomycin trough of 15-20.  Pharmacy is monitoring and dosing vancomycin.  Monitor labs intermittently -Currently has a right upper extremity PICC line. -Currently medically stable for discharge.  Left wrist/forearm abscess -MRI had shown 2.6 x 1.3 x 3 cm collection concerning for phlegmon versus developing abscess. -Status post I&D by orthopedics on 12/07/2018 with findings of purulent abscess with extension from the wrist into the forearm and necrotic soft tissue/palmaris tendon status post excision -continue iv vancomycin -Nonweightbearing left wrist -Follow-up orthopedics outpatient 1 week postop  Diabetes mellitus type 2 Intermittent hypoglycemia and hyperglycemia -Hemoglobin A1c 7.9 -Continue current regimen of Lantus along with NovoLog. -Continue CBGs with SSI.  Diarrhea -Improved.  Continue Imodium as needed.  Use Lomotil as well as needed.  Severe protein calorie malnutrition -Continue diet as per nutrition recommendations  Essential hypertension -Blood pressure improving.  Continue amlodipine 10 mg daily.  Monitor  Acute on chronic anemia, likely exacerbated by some postop blood loss anemia -Hemoglobin 7.4 at outside hospital and was transfused 1 unit packed red cells -Transfuse if hemoglobin is less than 7.   DVT prophylaxis: Heparin Code Status: Full Family Communication: None at bedside Disposition Plan: SNF once bed is available  Consultants: Orthopedics/ID  Procedures:  Transtibial amputation right lower extremity 11/30/2018 I&D of left wrist/elbow on 12/07/2018 PICC line placement, right upper extremity 12/04/2018  Antimicrobials:  Vancomycin from 11/28/2018 onwards    Subjective: Patient seen and examined at bedside.  No overnight fever,  vomiting, worsening abdominal pain reported.  She states that her diarrhea is improving.   Objective: Vitals:   12/16/18 1359 12/16/18 2056 12/17/18 0522 12/17/18 0600  BP: 127/78 108/66  135/75  Pulse: 95 (!) 101  98  Resp: 16 18    Temp: 98.8 F (37.1 C) 98.3 F (36.8 C)  97.8 F (36.6 C)  TempSrc: Oral   Oral  SpO2: 100% 99%  100%  Weight:   70.5 kg   Height:        Intake/Output Summary (Last 24 hours) at 12/17/2018 0722 Last data filed at 12/17/2018 0600 Gross per 24 hour  Intake 990.35 ml  Output 1100 ml  Net -109.65 ml   Filed Weights   12/14/18 0640 12/15/18 0536 12/17/18 0522  Weight: 74.2 kg 68.8 kg 70.5 kg    Examination:  General exam: No acute distress.  Looks older than stated age.  Poor historian.  Respiratory system: Bilateral decreased breath sounds at bases with some scattered basilar crackles.   Cardiovascular system: Rate controlled, S1-S2 heard gastrointestinal system: Abdomen is nondistended, soft and nontender. Normal bowel sounds heard. Extremities: No cyanosis, edema.  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  Left wrist dressing present.  Data Reviewed: I have personally reviewed following labs and imaging studies  CBC: Recent Labs  Lab 12/12/18 0355 12/14/18 0430 12/16/18 0325  WBC 9.6 9.0 7.8  NEUTROABS  --  6.6 5.4  HGB 8.6* 8.6* 8.9*  HCT 27.5* 27.8* 28.3*  MCV 99.6 101.5* 100.7*  PLT 351 293 294   Basic Metabolic Panel: Recent Labs  Lab 12/12/18 0355 12/14/18 0430 12/16/18 0325  NA 138 139 135  K 3.5 3.9 4.1  CL 99 103 101  CO2 30 30 28   GLUCOSE 211* 300* 305*  BUN 13 11 18   CREATININE 0.59 0.54 0.57  CALCIUM 9.0 8.9 9.1  MG  --  1.7 1.7   GFR: Estimated Creatinine Clearance: 85.8 mL/min (by C-G formula based on SCr of 0.57 mg/dL). Liver Function Tests: No results for input(s): AST, ALT, ALKPHOS, BILITOT, PROT, ALBUMIN in the last 168 hours. No results for  input(s): LIPASE, AMYLASE in the last 168 hours. No results for input(s): AMMONIA in the last 168 hours. Coagulation Profile: No results for input(s): INR, PROTIME in the last 168 hours. Cardiac Enzymes: No results for input(s): CKTOTAL, CKMB, CKMBINDEX, TROPONINI in the last 168 hours. BNP (last 3 results) No results for input(s): PROBNP in the last 8760 hours. HbA1C: No results for input(s): HGBA1C in the last 72 hours. CBG: Recent Labs  Lab 12/15/18 2148 12/16/18 0830 12/16/18 1139 12/16/18 1642 12/16/18 2053  GLUCAP 189* 264* 219* 193* 217*   Lipid Profile: No results for input(s): CHOL, HDL, LDLCALC, TRIG, CHOLHDL, LDLDIRECT in the last 72 hours. Thyroid Function Tests: No results for input(s): TSH, T4TOTAL, FREET4, T3FREE, THYROIDAB in the last 72 hours. Anemia Panel: No results for input(s): VITAMINB12, FOLATE, FERRITIN, TIBC, IRON, RETICCTPCT in the last 72 hours. Sepsis Labs: No results for input(s): PROCALCITON, LATICACIDVEN in the last 168 hours.  Recent Results (from the past 240 hour(s))  Aerobic/Anaerobic Culture (surgical/deep wound)     Status: None   Collection Time: 12/07/18 12:49 PM  Result Value Ref Range Status   Specimen Description ABSCESS  Final   Special Requests NONE  Final   Gram Stain   Final    RARE WBC PRESENT, PREDOMINANTLY MONONUCLEAR NO ORGANISMS SEEN    Culture   Final    RARE METHICILLIN  RESISTANT STAPHYLOCOCCUS AUREUS NO ANAEROBES ISOLATED Performed at [facility], [address] [place], [place] [address]    Report Status 12/12/2018 FINAL  Final   Organism ID, Bacteria METHICILLIN RESISTANT STAPHYLOCOCCUS AUREUS  Final      Susceptibility   Methicillin resistant staphylococcus aureus - MIC*    CIPROFLOXACIN >=8 RESISTANT Resistant     ERYTHROMYCIN >=8 RESISTANT Resistant     GENTAMICIN <=0.5 SENSITIVE Sensitive     OXACILLIN >=4 RESISTANT Resistant     TETRACYCLINE <=1 SENSITIVE Sensitive     VANCOMYCIN 1 SENSITIVE  Sensitive     TRIMETH/SULFA <=10 SENSITIVE Sensitive     CLINDAMYCIN >=8 RESISTANT Resistant     RIFAMPIN <=0.5 SENSITIVE Sensitive     Inducible Clindamycin NEGATIVE Sensitive     * RARE METHICILLIN RESISTANT STAPHYLOCOCCUS AUREUS         Radiology Studies: No results found.      Scheduled Meds: . amLODipine  10 mg Oral Daily  . docusate sodium  100 mg Oral BID  . heparin injection (subcutaneous)  5,000 Units Subcutaneous Q8H  . insulin aspart  0-15 Units Subcutaneous TID WC  . insulin aspart  0-5 Units Subcutaneous QHS  . insulin aspart  5 Units Subcutaneous TID WC  . insulin glargine  20 Units Subcutaneous BID  . multivitamin with minerals  1 tablet Oral Daily  . Ensure Max Protein  11 oz Oral Daily   Continuous Infusions: . sodium chloride 250 mL (11/30/18 0650)  . sodium chloride 10 mL/hr at 12/04/18 2231  . lactated ringers 10 mL/hr at 12/07/18 1121  . vancomycin Stopped (12/16/18 2238)     LOS: 19 days        Glade LloydKshitiz Kaja Jackowski, MD [facility] 12/17/2018, 7:22 AM

## 2018-12-17 NOTE — Progress Notes (Signed)
Physical Therapy Treatment Patient Details Name: Yolanda [name] MRN: [id number] DOB: Jun 22, 1976 Today's Date: 12/17/2018    History of Present Illness Pt is a 43 y.o. F with significant PMH of diabetes type 2, diabetic foot ulcer, who presents with sepsis with right foot infection, osteomyelitis and MRSA bacteremia and presumed endocarditis. 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 Pt now with L hand swelling and pain.      PT Comments    Patient seen for mobility progression. Pt is making progress toward PT goals and overall requires min guard/min A for functional transfer training and short distance gait training. Gait distance limited by R hand pain with weight bearing. Pt continues to demonstrate poor safety awareness. Continue to progress as tolerated.     Follow Up Recommendations  SNF;Supervision/Assistance - 24 hour     Equipment Recommendations  Other (comment)(TBD next venue)    Recommendations for Other Services       Precautions / Restrictions Precautions Precautions: Fall Precaution Comments: R BKA, wound vac Required Braces or Orthoses: Other Brace Other Brace: R residual limb guard Restrictions Weight Bearing Restrictions: Yes RLE Weight Bearing: Non weight bearing Other Position/Activity Restrictions: NWB L wrist per orthopedic note    Mobility  Bed Mobility Overal bed mobility: Modified Independent Bed Mobility: Rolling;Sidelying to Sit           General bed mobility comments: use of bed rail; cues to maintain L wrist NWB   Transfers Overall transfer level: Needs assistance Equipment used: Left platform walker Transfers: Sit to/from Stand;Stand Pivot Transfers Sit to Stand: Min assist Stand pivot transfers: Min assist       General transfer comment: assist to power up into standing and stabilize RW; cues for safe hand placement and sequencing   Ambulation/Gait Ambulation/Gait assistance: Min assist Gait Distance (Feet): 40 Feet Assistive device:  Left platform walker Gait Pattern/deviations: Step-to pattern;Trunk flexed Gait velocity: decreased   General Gait Details: distance limited by c/o R hand pain with weight bearing on RW; cues for upright posture and maintain safe proximity to RW; min A for balance   Stairs             Wheelchair Mobility    Modified Rankin (Stroke Patients Only)       Balance Overall balance assessment: Needs assistance Sitting-balance support: Feet unsupported;No upper extremity supported Sitting balance-[name]     Standing balance support: Bilateral upper extremity supported;During functional activity Standing balance-Leahy Scale: Poor Standing balance comment: relaint on B UE and external support                            Cognition Arousal/Alertness: Awake/alert Behavior During Therapy: WFL for tasks assessed/performed Overall Cognitive Status: Within Functional Limits for tasks assessed Area of Impairment: Problem solving;Safety/judgement;Awareness;Following commands                       Following Commands: Follows one step commands with increased time Safety/Judgement: Decreased awareness of safety;Decreased awareness of deficits Awareness: Emergent Problem Solving: Requires verbal cues;Difficulty sequencing;Requires tactile cues General Comments: pt is able to recall all precautions however needs cues to maintain during functional mobility especially L wrist       Exercises      General Comments        Pertinent Vitals/Pain Pain Assessment: Faces Faces Pain Scale: Hurts a little bit Pain Location: R hand with weight bearing  Pain Descriptors / Indicators:  Guarding;Sore Pain Intervention(s): Limited activity within patient's tolerance;Monitored during session;Repositioned    Home Living                      Prior Function            PT Goals (current goals can now be found in the care plan section) Progress towards PT  goals: Progressing toward goals    Frequency    Min 3X/week      PT Plan Current plan remains appropriate    Co-evaluation              AM-PAC PT "6 Clicks" Mobility   Outcome Measure  Help needed turning from your back to your side while in a flat bed without using bedrails?: A Little Help needed moving from lying on your back to sitting on the side of a flat bed without using bedrails?: A Little Help needed moving to and from a bed to a chair (including a wheelchair)?: A Little Help needed standing up from a chair using your arms (e.g., wheelchair or bedside chair)?: A Little Help needed to walk in hospital room?: A Little Help needed climbing 3-5 steps with a railing? : Total 6 Click Score: 16    End of Session Equipment Utilized During Treatment: Gait belt Activity Tolerance: Patient tolerated treatment well Patient left: with call bell/phone within reach;in chair;with chair alarm set Nurse Communication: Mobility status PT Visit Diagnosis: Other abnormalities of gait and mobility (R26.89);Pain Pain - Right/Left: Left Pain - part of body: Hand     Time: 9381-8299 PT Time Calculation (min) (ACUTE ONLY): 28 min  Charges:  $Gait Training: 23-37 mins                     Earney Navy, PTA Acute Rehabilitation Services Pager: [phone] Office: [phone]     Darliss Cheney 12/17/2018, 12:06 PM

## 2018-12-17 NOTE — Progress Notes (Signed)
Pharmacy Antibiotic Note  Yolanda [name] is a 43 y.o. female admitted on 11/28/2018 with diabetic foot infection.  Pharmacy has been consulted for vancomycin dosing. 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 Per MD note, she failed daptomycin and was transitioned to vancomycin (which was supposed to complete on 11/20/2018). 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      6/8 AM update:  renal function stable at 0.57  Plan: Cont vancomycin 750 mg IV q12h Continue treatment through 12/25/18 Re-check levels as needed   Height: 5\' 6"  (167.6 cm) Weight: 155 lb 6.8 oz (70.5 kg) IBW/kg (Calculated) : 59.3  Temp (24hrs), Avg:98.3 F (36.8 C), Min:97.8 F (36.6 C), Max:98.8 F (37.1 C)  Recent Labs  Lab 12/12/18 0355 12/12/18 1345 12/12/18 2245 12/14/18 0430 12/16/18 0325  WBC 9.6  --   --  9.0 7.8  CREATININE 0.59  --   --  0.54 0.57  VANCOTROUGH  --   --  11*  --   --   VANCOPEAK  --  23*  --   --   --     Estimated Creatinine Clearance: 85.8 mL/min (by C-G formula based on SCr of 0.57 mg/dL).    No Known Allergies  Capri Raben A. Levada Dy, PharmD, [place]  Please utilize Amion for appropriate phone number to reach the unit pharmacist ([place])

## 2018-12-18 LAB — GLUCOSE, CAPILLARY
Glucose-Capillary: 131 mg/dL — ABNORMAL HIGH (ref 70–99)
Glucose-Capillary: 152 mg/dL — ABNORMAL HIGH (ref 70–99)
Glucose-Capillary: 156 mg/dL — ABNORMAL HIGH (ref 70–99)
Glucose-Capillary: 275 mg/dL — ABNORMAL HIGH (ref 70–99)

## 2018-12-18 MED ORDER — CAMPHOR-MENTHOL 0.5-0.5 % EX LOTN
TOPICAL_LOTION | CUTANEOUS | Status: DC | PRN
  Filled 2018-12-18 (×2): qty 222

## 2018-12-18 MED ORDER — INSULIN GLARGINE 100 UNIT/ML ~~LOC~~ SOLN
22.0000 [IU] | Freq: Two times a day (BID) | SUBCUTANEOUS | Status: DC
  Administered 2018-12-18 – 2018-12-21 (×7): 22 [IU] via SUBCUTANEOUS
  Filled 2018-12-18 (×8): qty 0.22

## 2018-12-18 MED ORDER — HYDROXYZINE HCL 10 MG PO TABS
10.0000 mg | ORAL_TABLET | Freq: Three times a day (TID) | ORAL | Status: DC | PRN
  Administered 2018-12-18 – 2018-12-25 (×6): 10 mg via ORAL
  Filled 2018-12-18 (×9): qty 1

## 2018-12-18 NOTE — Progress Notes (Signed)
Nutrition Follow-up  RD working remotely.  DOCUMENTATION CODES:   Not applicable  INTERVENTION:   -Continue MVI with minerals daily -Continue Ensure Max po daily, each supplement provides 150 kcal and 30 grams of protein.   NUTRITION DIAGNOSIS:   Increased nutrient needs related to wound healing, post-op healing as evidenced by estimated needs.  Ongoing  GOAL:   Patient will meet greater than or equal to 90% of their needs  Progressing  MONITOR:   PO intake, Supplement acceptance, Labs, Weight trends, Skin, I & O's  REASON FOR ASSESSMENT:   Malnutrition Screening Tool, Consult Wound healing  ASSESSMENT:   Yolanda [name] is a 43 y.o. female with medical history significant of T2DM, diabetic foot ulcer, osteomyelitis and cellulitis with MRSA bacteremia, presumed endocarditis presenting with worsening right lower extremity pain and swelling.  5/22- s/pPROCEDURE: Transtibial amputation Application of Prevena wound VAC 5/26- PICC placed, s/p TEE- no endocarditis 5/28- MRI of lt wrist reveals fluid collection in the volar aspect radial lt wrist 5/29- s/p PROCEDURE: IRRIGATION AND DEBRIDEMENT LEFT WRIST; Irrigation and debridement left hand.  Reviewed I/O's: +1.3 L x 24 hours and -597 ml since 12/04/18  Pt remains with good appetite; noted 100% meal completion. Pt is also accepting on MVI and Ensure Max supplements.   Per MD notes, pt medically stable for discharge, awaiting SNF placement. CSW notes reveal investigating SNF placement on LOG.   Labs reviewed: CBGS: [phone] (inpatient orders for glycemic control are 0-15 units insulin aspart TID with meals, 0-5 units insulin aspart q HS, 5 units insulin aspart TID with meals, and 22 units insulin glargine BID)>   Diet Order:   Diet Order            Diet Carb Modified Fluid consistency: Thin; Room service appropriate? Yes with Assist  Diet effective now              EDUCATION NEEDS:   No education needs have been  identified at this time  Skin:  Skin Assessment: Skin Integrity Issues: Skin Integrity Issues:: Incisions Wound Vac: rt BKA site Incisions: s/p rt BKA Other: closed lt arm  Last BM:  12/17/18  Height:   Ht Readings from Last 1 Encounters:  11/28/18 5\' 6"  (1.676 m)    Weight:   Wt Readings from Last 1 Encounters:  12/18/18 69.1 kg    Ideal Body Weight:  55.3 kg(adjusted for rt BKA)  BMI:  Body mass index is 24.59 kg/m.  Estimated Nutritional Needs:   Kcal:  1700-1900  Protein:  95-110 grams  Fluid:  > 1.7 L    Akiya Morr A. Jimmye Norman, RD, LDN, [place] Registered Dietitian II Certified Diabetes Care and Education Specialist Pager: [phone] After hours Pager: [phone]

## 2018-12-18 NOTE — Progress Notes (Signed)
Patient ID: Yolanda Noseiffany [name], female   DOB: 1975/10/21, 43 y.o.   MRN: [id number]  PROGRESS NOTE    Yolanda Noseiffany [name]  [id number] DOB: 1975/10/21 DOA: 11/28/2018 PCP: System, Pcp Not In   Brief Narrative:  43 year old female with diabetes mellitus type 2, severe protein calorie malnutrition, diabetic foot ulcer, hospitalization from 10/07/2018-10/15/2018 for MRSA bacteremia with presumed endocarditis with failed daptomycin treatment and subsequently treated with vancomycin with completion on 11/20/2018.  She presented on 11/28/2018 with worsening pain and swelling of her right foot.  CT scan showed destruction of the entire mid and hindfoot with fluid and gas collections with surrounding cellulitis.  [facility].  She was started on intravenous antibiotics.  ID and orthopedics were consulted.  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  ID recommends IV vancomycin till 12/25/2018.  She is waiting for SNF placement.  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)   Severe protein-calorie malnutrition (HCC)   Diabetic polyneuropathy associated with type 2 diabetes mellitus (HCC)   Cigarette smoker   Subacute osteomyelitis, right ankle and foot (HCC)   Cutaneous abscess of right foot   Abscess of bursa, left wrist   Abscess of left hand  Sepsis: Present on admission MRSA bacteremia in a patient with history of recent MRSA bacteremia with Diabetic right foot infection/osteomyelitis -Patient was [facility] with CT of the foot showing destruction of the entire midfoot and hindfoot with fluid and gas collections and surrounding cellulitis. 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  Wound care as per orthopedics recommendations.  Outpatient follow-up with orthopedics/Dr. Lajoyce Cornersuda.  -Sepsis has resolved.  Currently hemodynamically stable. -Blood culture on presentation was positive for MRSA.  Repeat blood cultures from 11/30/2018 have been negative so far. -TEE on 11/30/2018 showed no evidence of endocarditis -ID recommended 4 weeks of IV vancomycin through 12/25/2018.  Goal vancomycin trough of 15-20.  Pharmacy is monitoring and dosing vancomycin.  Monitor labs intermittently -Currently has a right upper extremity PICC line. -Currently medically stable for discharge.  No recent fevers.  Left wrist/forearm abscess -MRI had shown 2.6 x 1.3 x 3 cm collection concerning for phlegmon versus developing abscess. -Status post I&D by orthopedics on 12/07/2018 with findings of purulent abscess with extension from the wrist into the forearm and necrotic soft tissue/palmaris tendon status post excision -continue iv vancomycin -Nonweightbearing left wrist -Follow-up orthopedics outpatient 1 week postop  Diabetes mellitus type 2 Intermittent hypoglycemia and hyperglycemia -Hemoglobin A1c 7.9 -Increase Lantus to 22 units twice a day.  Continue NovoLog. -Continue CBGs with SSI.  Diarrhea -Improved.  Continue Imodium as needed.  Use Lomotil as well as needed.  Severe protein calorie malnutrition -Continue diet as per nutrition recommendations  Essential hypertension -Blood pressure improving.  Continue amlodipine 10 mg daily.  Monitor  Acute on chronic anemia, likely exacerbated by some postop blood loss anemia -Hemoglobin 7.4 at outside hospital and was transfused 1 unit packed red cells -Transfuse if hemoglobin is less than 7. -Check a.m. labs.   DVT prophylaxis: Heparin Code Status: Full Family Communication: None at bedside Disposition Plan: SNF once bed is available  Consultants: Orthopedics/ID  Procedures:  Transtibial amputation right lower extremity 11/30/2018 I&D of left wrist/elbow on 12/07/2018 PICC line placement, right upper extremity 12/04/2018   Antimicrobials:  Vancomycin from 11/28/2018 onwards   Subjective:  Patient seen and examined at bedside.  Denies any overnight fever, nausea or vomiting.  Her diarrhea is improving.   Objective: Vitals:   12/17/18 2229 12/18/18 0500 12/18/18 0647 12/18/18 0704  BP: 129/80  135/85   Pulse: 97  (!) 102   Resp: 16  16   Temp: 98 F (36.7 C)  98 F (36.7 C)   TempSrc: Oral  Oral   SpO2: 100%  100%   Weight:  69.2 kg  69.1 kg  Height:        Intake/Output Summary (Last 24 hours) at 12/18/2018 0731 Last data filed at 12/17/2018 1700 Gross per 24 hour  Intake 1279 ml  Output -  Net 1279 ml   Filed Weights   12/17/18 0522 12/18/18 0500 12/18/18 0704  Weight: 70.5 kg 69.2 kg 69.1 kg    Examination:  General exam: No distress.  Looks older than stated age.  Poor historian.  Respiratory system: Bilateral decreased breath sounds at bases with some scattered basilar crackles.  No wheezing  Cardiovascular system: Rate controlled, intermittent tachycardia gastrointestinal system: Abdomen is nondistended, soft and nontender. Normal bowel sounds heard. Extremities: No cyanosis, edema.  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  Left wrist dressing present.  Data Reviewed: I have personally reviewed following labs and imaging studies  CBC: Recent Labs  Lab 12/12/18 0355 12/14/18 0430 12/16/18 0325  WBC 9.6 9.0 7.8  NEUTROABS  --  6.6 5.4  HGB 8.6* 8.6* 8.9*  HCT 27.5* 27.8* 28.3*  MCV 99.6 101.5* 100.7*  PLT 351 293 294   Basic Metabolic Panel: Recent Labs  Lab 12/12/18 0355 12/14/18 0430 12/16/18 0325  NA 138 139 135  K 3.5 3.9 4.1  CL 99 103 101  CO2 30 30 28   GLUCOSE 211* 300* 305*  BUN 13 11 18   CREATININE 0.59 0.54 0.57  CALCIUM 9.0 8.9 9.1  MG  --  1.7 1.7   GFR: Estimated Creatinine Clearance: 85.8 mL/min (by C-G formula based on SCr of 0.57 mg/dL). Liver Function Tests: No results for input(s): AST, ALT, ALKPHOS, BILITOT, PROT, ALBUMIN in the last 168 hours. No  results for input(s): LIPASE, AMYLASE in the last 168 hours. No results for input(s): AMMONIA in the last 168 hours. Coagulation Profile: No results for input(s): INR, PROTIME in the last 168 hours. Cardiac Enzymes: No results for input(s): CKTOTAL, CKMB, CKMBINDEX, TROPONINI in the last 168 hours. BNP (last 3 results) No results for input(s): PROBNP in the last 8760 hours. HbA1C: No results for input(s): HGBA1C in the last 72 hours. CBG: Recent Labs  Lab 12/16/18 2053 12/17/18 0814 12/17/18 1203 12/17/18 1752 12/17/18 2151  GLUCAP 217* 220* 257* 282* 164*   Lipid Profile: No results for input(s): CHOL, HDL, LDLCALC, TRIG, CHOLHDL, LDLDIRECT in the last 72 hours. Thyroid Function Tests: No results for input(s): TSH, T4TOTAL, FREET4, T3FREE, THYROIDAB in the last 72 hours. Anemia Panel: No results for input(s): VITAMINB12, FOLATE, FERRITIN, TIBC, IRON, RETICCTPCT in the last 72 hours. Sepsis Labs: No results for input(s): PROCALCITON, LATICACIDVEN in the last 168 hours.  No results found for this or any previous visit (from the past 240 hour(s)).       Radiology Studies: No results found.      Scheduled Meds: . amLODipine  10 mg Oral Daily  . docusate sodium  100 mg Oral BID  . heparin injection (subcutaneous)  5,000 Units Subcutaneous Q8H  . insulin aspart  0-15 Units Subcutaneous TID WC  . insulin  aspart  0-5 Units Subcutaneous QHS  . insulin aspart  5 Units Subcutaneous TID WC  . insulin glargine  20 Units Subcutaneous BID  . multivitamin with minerals  1 tablet Oral Daily  . Ensure Max Protein  11 oz Oral Daily   Continuous Infusions: . sodium chloride 250 mL (11/30/18 0650)  . sodium chloride 10 mL/hr at 12/04/18 2231  . lactated ringers 10 mL/hr at 12/07/18 1121  . vancomycin 750 mg (12/17/18 2232)     LOS: 20 days        Aline August, MD [facility] 12/18/2018, 7:31 AM

## 2018-12-19 LAB — GLUCOSE, CAPILLARY
Glucose-Capillary: 173 mg/dL — ABNORMAL HIGH (ref 70–99)
Glucose-Capillary: 174 mg/dL — ABNORMAL HIGH (ref 70–99)
Glucose-Capillary: 166 mg/dL — ABNORMAL HIGH (ref 70–99)
Glucose-Capillary: 176 mg/dL — ABNORMAL HIGH (ref 70–99)

## 2018-12-19 LAB — BASIC METABOLIC PANEL
Anion gap: 7 (ref 5–15)
BUN: 18 mg/dL (ref 6–20)
CO2: 28 mmol/L (ref 22–32)
Calcium: 9.6 mg/dL (ref 8.9–10.3)
Chloride: 101 mmol/L (ref 98–111)
Creatinine, Ser: 0.59 mg/dL (ref 0.44–1.00)
GFR calc Af Amer: >60 mL/min (ref >60)
GFR calc non Af Amer: >60 mL/min (ref >60)
Glucose, Bld: 165 mg/dL — ABNORMAL HIGH (ref 70–99)
Potassium: 4 mmol/L (ref 3.5–5.1)
Sodium: 136 mmol/L (ref 135–145)

## 2018-12-19 LAB — CBC WITH DIFFERENTIAL/PLATELET
Abs Immature Granulocytes: 0.02 10*3/uL (ref 0.00–0.07)
Basophils Absolute: 0 10*3/uL (ref 0.0–0.1)
Basophils Relative: 1 %
Eosinophils Absolute: 0.3 10*3/uL (ref 0.0–0.5)
Eosinophils Relative: 4 %
HCT: 30 % — ABNORMAL LOW (ref 36.0–46.0)
Hemoglobin: 9.2 g/dL — ABNORMAL LOW (ref 12.0–15.0)
Immature Granulocytes: 0 %
Lymphocytes Relative: 33 %
Lymphs Abs: 2.2 10*3/uL (ref 0.7–4.0)
MCH: 31 pg (ref 26.0–34.0)
MCHC: 30.7 g/dL (ref 30.0–36.0)
MCV: 101 fL — ABNORMAL HIGH (ref 80.0–100.0)
Monocytes Absolute: 0.7 10*3/uL (ref 0.1–1.0)
Monocytes Relative: 10 %
Neutro Abs: 3.6 10*3/uL (ref 1.7–7.7)
Neutrophils Relative %: 52 %
Platelets: 310 10*3/uL (ref 150–400)
RBC: 2.97 MIL/uL — ABNORMAL LOW (ref 3.87–5.11)
RDW: 16.8 % — ABNORMAL HIGH (ref 11.5–15.5)
WBC: 6.8 10*3/uL (ref 4.0–10.5)
nRBC: 0 % (ref 0.0–0.2)

## 2018-12-19 LAB — MAGNESIUM: Magnesium: 1.8 mg/dL (ref 1.7–2.4)

## 2018-12-19 NOTE — Progress Notes (Signed)
PROGRESS NOTE    Yolanda [name]   ZOX:[id number]  DOB: 10-29-1975  DOA: 11/28/2018 PCP: System, Pcp Not In   Brief Narrative:  Yolanda Noseiffany Vogler 43 year old female with diabetes mellitus type 2, severe protein calorie malnutrition, diabetic foot ulcer, hospitalization from 10/07/2018-10/15/2018 for MRSA bacteremia with presumed endocarditis with failed daptomycin treatment and subsequently treated with vancomycin with completion on 11/20/2018.  She presented on 11/28/2018 with worsening pain and swelling of her right foot.  CT scan showed destruction of the entire mid and hindfoot with fluid and gas collections with surrounding cellulitis.  [facility].  She was started on intravenous antibiotics.  ID and orthopedics were consulted.  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  ID recommends IV vancomycin till 12/25/2018.  She is waiting for SNF placement.   Subjective: No complaints.   Assessment & Plan:   Principal Problem:   MRSA bacteremia Diabetic right foot infection/osteomyelitis - 5/20 > blood cultures MRSA - 5/22> transtibial amputation - 5/22 > TEE- no endocarditis - ID recommend 4 wks of IV Vancomycin stop date 12/25/18 - remove RUE PICC line afterwards - awaiting SNF placement  Left wrist/ forearm abscess - I and D 12/07/18- culture shows MRSA - last ortho eval was on 6/5- plan daily dressing changes, f/u in office for suture and stable removal     Diabetes mellitus type 2, uncontrolled - A1c 7.9- cont Lantus and Novolog    Severe protein-calorie malnutrition -cont supplements     Time spent in minutes: 35 min DVT prophylaxis: Heparin Code Status: Full code Family Communication:  Disposition Plan: awaiting SNF bed Consultants:   Ortho  ID Procedures:  Transtibial amputation right lower extremity 11/30/2018 I&D of left wrist/elbow on 12/07/2018 PICC line placement, right upper extremity 12/04/2018  Antimicrobials:  Anti-infectives (From admission, onward)   Start     Dose/Rate Route Frequency Ordered Stop   12/10/18 1000  vancomycin (VANCOCIN) IVPB 750 mg/150 ml premix     750 mg 150 mL/hr over 60 Minutes Intravenous Every 12 hours 12/10/18 0136     12/07/18 0600  ceFAZolin (ANCEF) IVPB 2g/100 mL premix     2 g 200 mL/hr over 30 Minutes Intravenous On call to O.R. 12/06/18 2202 12/07/18 1236   12/05/18 2200  vancomycin (VANCOCIN) IVPB 1000 mg/200 mL premix  Status:  Discontinued     1,000 mg 200 mL/hr over 60 Minutes Intravenous Every 12 hours 12/05/18 1355 12/10/18 0136   12/01/18 1330  vancomycin (VANCOCIN) 1,250 mg in sodium chloride 0.9 % 250 mL IVPB  Status:  Discontinued     1,250 mg 166.7 mL/hr over 90 Minutes Intravenous Every 12 hours 12/01/18 1310 12/05/18 1355   11/30/18 1500  ceFEPIme (MAXIPIME) 2 g in sodium chloride 0.9 % 100 mL IVPB  Status:  Discontinued     2 g 200 mL/hr over 30 Minutes Intravenous Every 8 hours 11/30/18 1416 12/03/18 1254   11/30/18 1500  metroNIDAZOLE (FLAGYL) tablet 500 mg  Status:  Discontinued     500 mg Oral Every 8 hours 11/30/18 1416 12/03/18 1254   11/30/18 1415  metroNIDAZOLE (FLAGYL) IVPB 500 mg  Status:  Discontinued     500 mg 100 mL/hr over 60 Minutes Intravenous Every 8 hours 11/30/18 1411 11/30/18 1416   11/28/18 2045  vancomycin (VANCOCIN) 1,750 mg in sodium chloride 0.9 % 500 mL IVPB  Status:  Discontinued     1,750 mg 250 mL/hr over 120 Minutes Intravenous Every  24 hours 11/28/18 2032 12/01/18 1310   11/28/18 2030  vancomycin (VANCOCIN) 1,250 mg in sodium chloride 0.9 % 250 mL IVPB  Status:  Discontinued     1,250 mg 166.7 mL/hr over 90 Minutes Intravenous Every 24 hours 11/28/18 2004 11/28/18 2032   11/28/18 1900  ceFEPIme (MAXIPIME) 2 g in sodium chloride 0.9 % 100 mL IVPB  Status:  Discontinued     2 g 200 mL/hr over 30 Minutes Intravenous Every 8 hours 11/28/18 1848 11/30/18 1304   11/28/18 1815  metroNIDAZOLE (FLAGYL)  tablet 500 mg  Status:  Discontinued     500 mg Oral Every 8 hours 11/28/18 1803 11/30/18 1304       Objective: Vitals:   12/18/18 1340 12/19/18 0452 12/19/18 0500 12/19/18 1239  BP: 129/74 132/81  131/81  Pulse: 98 95  93  Resp: 18 18  15   Temp: 98.4 F (36.9 C) 98 F (36.7 C)  98.4 F (36.9 C)  TempSrc: Oral Oral  Oral  SpO2: 100% 100%  100%  Weight:   68 kg   Height:        Intake/Output Summary (Last 24 hours) at 12/19/2018 1556 Last data filed at 12/19/2018 0919 Gross per 24 hour  Intake 358 ml  Output -  Net 358 ml   Filed Weights   12/18/18 0500 12/18/18 0704 12/19/18 0500  Weight: 69.2 kg 69.1 kg 68 kg    Examination: General exam: Appears comfortable  HEENT: PERRLA, oral mucosa moist, no sclera icterus or thrush Respiratory system: Clear to auscultation. Respiratory effort normal. Cardiovascular system: S1 & S2 heard, RRR.   Gastrointestinal system: Abdomen soft, non-tender, nondistended. Normal bowel sounds. Central nervous system: Alert and oriented. No focal neurological deficits. Extremities: No cyanosis, clubbing or edema- right BKA Psychiatry:  Mood & affect appropriate.   Data Reviewed: I have personally reviewed following labs and imaging studies  CBC: Recent Labs  Lab 12/14/18 0430 12/16/18 0325 12/19/18 0432  WBC 9.0 7.8 6.8  NEUTROABS 6.6 5.4 3.6  HGB 8.6* 8.9* 9.2*  HCT 27.8* 28.3* 30.0*  MCV 101.5* 100.7* 101.0*  PLT 293 294 310   Basic Metabolic Panel: Recent Labs  Lab 12/14/18 0430 12/16/18 0325 12/19/18 0432  NA 139 135 136  K 3.9 4.1 4.0  CL 103 101 101  CO2 30 28 28   GLUCOSE 300* 305* 165*  BUN 11 18 18   CREATININE 0.54 0.57 0.59  CALCIUM 8.9 9.1 9.6  MG 1.7 1.7 1.8   GFR: Estimated Creatinine Clearance: 85.8 mL/min (by C-G formula based on SCr of 0.59 mg/dL). Liver Function Tests: No results for input(s): AST, ALT, ALKPHOS, BILITOT, PROT, ALBUMIN in the last 168 hours. No results for input(s): LIPASE, AMYLASE in  the last 168 hours. No results for input(s): AMMONIA in the last 168 hours. Coagulation Profile: No results for input(s): INR, PROTIME in the last 168 hours. Cardiac Enzymes: No results for input(s): CKTOTAL, CKMB, CKMBINDEX, TROPONINI in the last 168 hours. BNP (last 3 results) No results for input(s): PROBNP in the last 8760 hours. HbA1C: No results for input(s): HGBA1C in the last 72 hours. CBG: Recent Labs  Lab 12/18/18 1147 12/18/18 1634 12/18/18 2156 12/19/18 0828 12/19/18 1239  GLUCAP 152* 156* 275* 173* 174*   Lipid Profile: No results for input(s): CHOL, HDL, LDLCALC, TRIG, CHOLHDL, LDLDIRECT in the last 72 hours. Thyroid Function Tests: No results for input(s): TSH, T4TOTAL, FREET4, T3FREE, THYROIDAB in the last 72 hours. Anemia Panel: No results for  input(s): VITAMINB12, FOLATE, FERRITIN, TIBC, IRON, RETICCTPCT in the last 72 hours. Urine analysis:    Component Value Date/Time   COLORURINE YELLOW 11/29/2018 [place] 11/29/2018 0653   LABSPEC 1.010 11/29/2018 0653   PHURINE 5.5 11/29/2018 0653   GLUCOSEU NEGATIVE 11/29/2018 0653   HGBUR TRACE (A) 11/29/2018 0653   BILIRUBINUR SMALL (A) 11/29/2018 0653   KETONESUR NEGATIVE 11/29/2018 0653   PROTEINUR NEGATIVE 11/29/2018 0653   NITRITE NEGATIVE 11/29/2018 0653   LEUKOCYTESUR NEGATIVE 11/29/2018 0653   Sepsis Labs: @LABRCNTIP (procalcitonin:4,lacticidven:4) )No results found for this or any previous visit (from the past 240 hour(s)).       Radiology Studies: No results found.    Scheduled Meds: . amLODipine  10 mg Oral Daily  . docusate sodium  100 mg Oral BID  . heparin injection (subcutaneous)  5,000 Units Subcutaneous Q8H  . insulin aspart  0-15 Units Subcutaneous TID WC  . insulin aspart  0-5 Units Subcutaneous QHS  . insulin aspart  5 Units Subcutaneous TID WC  . insulin glargine  22 Units Subcutaneous BID  . multivitamin with minerals  1 tablet Oral Daily  . Ensure Max  Protein  11 oz Oral Daily   Continuous Infusions: . sodium chloride 250 mL (11/30/18 0650)  . sodium chloride 10 mL/hr at 12/04/18 2231  . lactated ringers 10 mL/hr at 12/07/18 1121  . vancomycin 750 mg (12/19/18 0940)     LOS: 21 days      Debbe Odea, MD [facility] Pager: [email or web address] Password [facility] 12/19/2018, 3:56 PM

## 2018-12-20 LAB — VANCOMYCIN, PEAK: Vancomycin Pk: 18 ug/mL — ABNORMAL LOW (ref 30–40)

## 2018-12-20 LAB — GLUCOSE, CAPILLARY
Glucose-Capillary: 156 mg/dL — ABNORMAL HIGH (ref 70–99)
Glucose-Capillary: 359 mg/dL — ABNORMAL HIGH (ref 70–99)
Glucose-Capillary: 281 mg/dL — ABNORMAL HIGH (ref 70–99)
Glucose-Capillary: 158 mg/dL — ABNORMAL HIGH (ref 70–99)

## 2018-12-20 NOTE — Progress Notes (Signed)
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  She presented on 11/28/2018 with worsening pain and swelling of her right foot.  CT scan showed destruction of the entire mid and hindfoot with fluid and gas collections with surrounding cellulitis.  [facility]  She was started on intravenous antibiotics.  ID and orthopedics were consulted.  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  ID recommends IV vancomycin till 12/25/2018.  She is waiting for SNF placement.   Subjective: No complaints.   Assessment & Plan:   Principal Problem:   MRSA bacteremia Diabetic right foot infection/osteomyelitis - 5/20 > blood cultures MRSA - 5/22> transtibial amputation - 5/22 > TEE- no endocarditis - ID recommend 4 wks of IV Vancomycin stop date 12/25/18 - remove RUE PICC line afterwards - still awaiting SNF placement  Left wrist/ forearm abscess - I and D 12/07/18- culture shows MRSA - last ortho eval was on 6/5- plan daily dressing changes, f/u in office for suture and staple removal - non weight bearing on left wrist     Diabetes mellitus type 2, uncontrolled - A1c 7.9- cont Lantus and Novolog    Severe protein-calorie malnutrition -cont supplements   Essential HTN - cont Amlodipine  Acute on chronic anemia- ? Acute blood loss - s/p 1 u PRBC for Hb of 7.4 at outside hospital    Time spent in minutes: 35 min DVT prophylaxis: Heparin Code Status: Full code Family Communication:  Disposition Plan: awaiting SNF bed- spoke with social work today  Consultants:   Ortho  ID Procedures:  Transtibial amputation right lower extremity 11/30/2018 I&D of left wrist/elbow on 12/07/2018 PICC line placement, right upper extremity 12/04/2018 Antimicrobials:  Anti-infectives (From admission, onward)   Start     Dose/Rate Route Frequency Ordered Stop   12/10/18 1000  vancomycin (VANCOCIN) IVPB 750 mg/150 ml premix     750 mg 150 mL/hr over 60 Minutes Intravenous Every 12 hours 12/10/18 0136     12/07/18 0600  ceFAZolin (ANCEF) IVPB 2g/100 mL premix     2 g 200 mL/hr over 30 Minutes Intravenous On call to O.R. 12/06/18 2202 12/07/18 1236   12/05/18 2200  vancomycin (VANCOCIN) IVPB 1000 mg/200 mL premix  Status:  Discontinued     1,000 mg 200 mL/hr over 60 Minutes Intravenous Every 12 hours 12/05/18 1355 12/10/18 0136   12/01/18 1330  vancomycin (VANCOCIN) 1,250 mg in sodium chloride 0.9 % 250 mL IVPB  Status:  Discontinued     1,250 mg 166.7 mL/hr over 90 Minutes Intravenous Every 12 hours 12/01/18 1310 12/05/18 1355   11/30/18 1500  ceFEPIme (MAXIPIME) 2 g in sodium chloride 0.9 % 100 mL IVPB  Status:  Discontinued     2 g 200 mL/hr over 30 Minutes Intravenous Every 8 hours 11/30/18 1416 12/03/18 1254   11/30/18 1500  metroNIDAZOLE (FLAGYL) tablet 500 mg  Status:  Discontinued     500 mg Oral Every 8 hours 11/30/18 1416 12/03/18 1254   11/30/18 1415  metroNIDAZOLE (FLAGYL) IVPB 500 mg  Status:  Discontinued     500 mg 100 mL/hr over 60 Minutes Intravenous Every  8 hours 11/30/18 1411 11/30/18 1416   11/28/18 2045  vancomycin (VANCOCIN) 1,750 mg in sodium chloride 0.9 % 500 mL IVPB  Status:  Discontinued     1,750 mg 250 mL/hr over 120 Minutes Intravenous Every 24 hours 11/28/18 2032 12/01/18 1310   11/28/18 2030  vancomycin (VANCOCIN) 1,250 mg in sodium chloride 0.9 % 250 mL IVPB  Status:  Discontinued     1,250 mg 166.7 mL/hr over 90 Minutes Intravenous Every 24 hours 11/28/18 2004 11/28/18 2032   11/28/18 1900  ceFEPIme (MAXIPIME) 2 g  in sodium chloride 0.9 % 100 mL IVPB  Status:  Discontinued     2 g 200 mL/hr over 30 Minutes Intravenous Every 8 hours 11/28/18 1848 11/30/18 1304   11/28/18 1815  metroNIDAZOLE (FLAGYL) tablet 500 mg  Status:  Discontinued     500 mg Oral Every 8 hours 11/28/18 1803 11/30/18 1304       Objective: Vitals:   12/19/18 2153 12/20/18 0500 12/20/18 0634 12/20/18 1310  BP: 121/72  121/72 138/85  Pulse: (!) 101  (!) 101 (!) 109  Resp: 19  18 18   Temp: 98.4 F (36.9 C)  98 F (36.7 C) 97.6 F (36.4 C)  TempSrc: Oral  Oral Oral  SpO2: 100%  100% 100%  Weight:  68 kg    Height:        Intake/Output Summary (Last 24 hours) at 12/20/2018 1334 Last data filed at 12/20/2018 1238 Gross per 24 hour  Intake 240 ml  Output 625 ml  Net -385 ml   Filed Weights   12/18/18 0704 12/19/18 0500 12/20/18 0500  Weight: 69.1 kg 68 kg 68 kg    Examination: General exam: Appears comfortable  HEENT: PERRLA, oral mucosa moist, no sclera icterus or thrush Respiratory system: Clear to auscultation. Respiratory effort normal. Cardiovascular system: S1 & S2 heard,  No murmurs  Gastrointestinal system: Abdomen soft, non-tender, nondistended. Normal bowel sounds   Central nervous system: Alert and oriented. No focal neurological deficits. Extremities: No cyanosis, clubbing or edema- right BKA Skin: No rashes or ulcers Psychiatry:  Mood & affect appropriate.   Data Reviewed: I have personally reviewed following labs and imaging studies  CBC: Recent Labs  Lab 12/14/18 0430 12/16/18 0325 12/19/18 0432  WBC 9.0 7.8 6.8  NEUTROABS 6.6 5.4 3.6  HGB 8.6* 8.9* 9.2*  HCT 27.8* 28.3* 30.0*  MCV 101.5* 100.7* 101.0*  PLT 293 294 119   Basic Metabolic Panel: Recent Labs  Lab 12/14/18 0430 12/16/18 0325 12/19/18 0432  NA 139 135 136  K 3.9 4.1 4.0  CL 103 101 101  CO2 30 28 28   GLUCOSE 300* 305* 165*  BUN 11 18 18   CREATININE 0.54 0.57 0.59  CALCIUM 8.9 9.1 9.6  MG 1.7 1.7 1.8   GFR:  Estimated Creatinine Clearance: 85.8 mL/min (by C-G formula based on SCr of 0.59 mg/dL). Liver Function Tests: No results for input(s): AST, ALT, ALKPHOS, BILITOT, PROT, ALBUMIN in the last 168 hours. No results for input(s): LIPASE, AMYLASE in the last 168 hours. No results for input(s): AMMONIA in the last 168 hours. Coagulation Profile: No results for input(s): INR, PROTIME in the last 168 hours. Cardiac Enzymes: No results for input(s): CKTOTAL, CKMB, CKMBINDEX, TROPONINI in the last 168 hours. BNP (last 3 results) No results for input(s): PROBNP in the last 8760 hours. HbA1C: No results for input(s): HGBA1C in the last 72 hours. CBG: Recent Labs  Lab 12/19/18 1239 12/19/18 1705 12/19/18 2154  12/20/18 0815 12/20/18 1135  GLUCAP 174* 166* 176* 156* 359*   Lipid Profile: No results for input(s): CHOL, HDL, LDLCALC, TRIG, CHOLHDL, LDLDIRECT in the last 72 hours. Thyroid Function Tests: No results for input(s): TSH, T4TOTAL, FREET4, T3FREE, THYROIDAB in the last 72 hours. Anemia Panel: No results for input(s): VITAMINB12, FOLATE, FERRITIN, TIBC, IRON, RETICCTPCT in the last 72 hours. Urine analysis:    Component Value Date/Time   COLORURINE YELLOW 11/29/2018 0653   APPEARANCEUR CLEAR 11/29/2018 0653   LABSPEC 1.010 11/29/2018 0653   PHURINE 5.5 11/29/2018 0653   GLUCOSEU NEGATIVE 11/29/2018 0653   HGBUR TRACE (A) 11/29/2018 0653   BILIRUBINUR SMALL (A) 11/29/2018 0653   KETONESUR NEGATIVE 11/29/2018 0653   PROTEINUR NEGATIVE 11/29/2018 0653   NITRITE NEGATIVE 11/29/2018 0653   LEUKOCYTESUR NEGATIVE 11/29/2018 0653   Sepsis Labs: @LABRCNTIP (procalcitonin:4,lacticidven:4) )No results found for this or any previous visit (from the past 240 hour(s)).       Radiology Studies: No results found.    Scheduled Meds: . amLODipine  10 mg Oral Daily  . docusate sodium  100 mg Oral BID  . heparin injection (subcutaneous)  5,000 Units Subcutaneous Q8H  . insulin aspart   0-15 Units Subcutaneous TID WC  . insulin aspart  0-5 Units Subcutaneous QHS  . insulin aspart  5 Units Subcutaneous TID WC  . insulin glargine  22 Units Subcutaneous BID  . multivitamin with minerals  1 tablet Oral Daily  . Ensure Max Protein  11 oz Oral Daily   Continuous Infusions: . sodium chloride 250 mL (11/30/18 0650)  . sodium chloride 10 mL/hr at 12/04/18 2231  . lactated ringers 10 mL/hr at 12/07/18 1121  . vancomycin 750 mg (12/20/18 0926)     LOS: 22 days      Calvert CantorSaima Jamacia Jester, MD [facility] Pager: [email or web address] Password [email or web address] 12/20/2018, 1:34 PM

## 2018-12-20 NOTE — Progress Notes (Signed)
Occupational Therapy Treatment Patient Details Name: Yolanda [name] MRN: [id number] DOB: 05-26-1976 Today's Date: 12/20/2018    History of present illness Pt is a 43 y.o. F with significant PMH of diabetes type 2, diabetic foot ulcer, who presents with sepsis with right foot infection, osteomyelitis and MRSA bacteremia and presumed endocarditis. 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 Pt now with L hand swelling and pain.     OT comments  Pt progressing toward established goals. Pt has met some of her goals, which have been updated this session. Pt currently requires minA to complete grooming while standing at sink level. Pt requires minA for in room ambulation with use of left side platform walker. Pt will continue to benefit from skilled OT services to maximize safety and independence with ADL/IADL and functional mobility. Will continue to follow acutely and progress as tolerated. Continue to recommend SNF follow up.    Follow Up Recommendations  SNF;Supervision/Assistance - 24 hour    Equipment Recommendations  3 in 1 bedside commode    Recommendations for Other Services      Precautions / Restrictions Precautions Precautions: Fall Precaution Comments: R BKA, NWB through L wrist  Required Braces or Orthoses: Other Brace Other Brace: R residual limb guard Restrictions Weight Bearing Restrictions: Yes RLE Weight Bearing: Non weight bearing Other Position/Activity Restrictions: NWB L wrist per orthopedic note       Mobility Bed Mobility Overal bed mobility: Modified Independent                Transfers Overall transfer level: Needs assistance Equipment used: Left platform walker Transfers: Sit to/from Stand Sit to Stand: Min assist         General transfer comment: assist to power up into standing and stabilize RW; cues for safe hand placement and sequencing     Balance Overall balance assessment: Needs assistance Sitting-balance support: Feet unsupported;No  upper extremity supported Sitting balance-[name]     Standing balance support: Single extremity supported;During functional activity Standing balance-Leahy Scale: Poor Standing balance comment: reliant on support from LUE through elbow while standing at sink level to complete oral care                           ADL either performed or assessed with clinical judgement   ADL Overall ADL's : Needs assistance/impaired     Grooming: Oral care;Wash/dry face;Min guard;Standing Grooming Details (indicate cue type and reason): pt completed oral care and wash face while standing at the sink              Lower Body Dressing: Moderate assistance Lower Body Dressing Details (indicate cue type and reason): modA to don/doff residual limb guard Toilet Transfer: Minimal assistance;Ambulation;RW Toilet Transfer Details (indicate cue type and reason): simulated transfer from/return to EOB with ambulation in room         Functional mobility during ADLs: Cueing for safety;Cueing for sequencing;Minimal assistance General ADL Comments: continues to require vc for NWB through L wrist during mobility and ADL completion;     Vision   Vision Assessment?: No apparent visual deficits   Perception     Praxis      Cognition Arousal/Alertness: Awake/alert Behavior During Therapy: Flat affect Overall Cognitive Status: No family/caregiver present to determine baseline cognitive functioning Area of Impairment: Problem solving;Safety/judgement;Awareness;Following commands                       Following  Commands: Follows one step commands with increased time Safety/Judgement: Decreased awareness of safety;Decreased awareness of deficits Awareness: Emergent Problem Solving: Requires verbal cues;Difficulty sequencing;Requires tactile cues General Comments: pt continues to report feeling depressed, states she "wants to go outside"        Exercises     Shoulder  Instructions       General Comments      Pertinent Vitals/ Pain       Pain Assessment: No/denies pain Pain Intervention(s): Monitored during session  Home Living                                          Prior Functioning/Environment              Frequency  Min 2X/week        Progress Toward Goals  OT Goals(current goals can now be found in the care plan section)  Progress towards OT goals: Progressing toward goals;Goals met and updated - see care plan  Acute Rehab OT Goals Patient Stated Goal: to be able to go outside OT Goal Formulation: With patient Time For Goal Achievement: 01/03/19 Potential to Achieve Goals: Good ADL Goals Pt Will Perform Lower Body Bathing: with min assist;sitting/lateral leans;with adaptive equipment Pt Will Perform Lower Body Dressing: with min assist;sitting/lateral leans;sit to/from stand;with adaptive equipment Pt Will Transfer to Toilet: with supervision;ambulating Pt Will Perform Toileting - Clothing Manipulation and hygiene: with supervision;sitting/lateral leans Pt/caregiver will Perform Home Exercise Program: Increased strength;Increased ROM;Both right and left upper extremity;With written HEP provided  Plan Discharge plan remains appropriate;Frequency remains appropriate    Co-evaluation                 AM-PAC OT "6 Clicks" Daily Activity     Outcome Measure   Help from another person eating meals?: None Help from another person taking care of personal grooming?: A Little Help from another person toileting, which includes using toliet, bedpan, or urinal?: A Little Help from another person bathing (including washing, rinsing, drying)?: A Little Help from another person to put on and taking off regular upper body clothing?: A Little Help from another person to put on and taking off regular lower body clothing?: A Lot 6 Click Score: 18    End of Session Equipment Utilized During Treatment: Gait  belt;Rolling walker  OT Visit Diagnosis: Unsteadiness on feet (R26.81);Muscle weakness (generalized) (M62.81)   Activity Tolerance Patient tolerated treatment well   Patient Left in bed;with call bell/phone within reach;with nursing/sitter in room(NT present)   Nurse Communication Mobility status        Time: 1423-9532 OT Time Calculation (min): 21 min  Charges: OT General Charges $OT Visit: 1 Visit OT Treatments $Self Care/Home Management : 8-22 mins  Dorinda Hill OTR/L Acute Rehabilitation Services Office: [place] 12/20/2018, 12:43 PM

## 2018-12-20 NOTE — Progress Notes (Signed)
Physical Therapy Treatment Patient Details Name: Yolanda [name] Kight MRN: [id number] DOB: [birth year] Today's Date: 12/20/2018    History of Present Illness Pt is a 43 y.o. F with significant PMH of diabetes type 2, diabetic foot ulcer, who presents with sepsis with right foot infection, osteomyelitis and MRSA bacteremia and presumed endocarditis. 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 Pt now with L hand swelling and pain.      PT Comments    Patient seen for mobility progression. Pt continues to make progress toward PT goals and tolerated gait training distance of 30 ft then 20 ft with seated rest break. Pt requires min A for functional transfer and gait training with L platform RW. Continue to progress as tolerated with anticipated d/c to SNF for further skilled PT services.     Follow Up Recommendations  SNF;Supervision/Assistance - 24 hour     Equipment Recommendations  Other (comment)(TBD )    Recommendations for Other Services       Precautions / Restrictions Precautions Precautions: Fall Precaution Comments: R BKA, NWB through L wrist  Required Braces or Orthoses: Other Brace Other Brace: R residual limb guard Restrictions Weight Bearing Restrictions: Yes RLE Weight Bearing: Non weight bearing Other Position/Activity Restrictions: NWB L wrist per orthopedic note    Mobility  Bed Mobility Overal bed mobility: Modified Independent                Transfers Overall transfer level: Needs assistance Equipment used: Left platform walker Transfers: Sit to/from Stand Sit to Stand: Min assist         General transfer comment: pt stood from EOB and recliner; assist to power up into standing and stabilize RW; cues for safe hand placement and sequencing   Ambulation/Gait Ambulation/Gait assistance: Min guard;Min assist Gait Distance (Feet): (30 ft then 20 ft with seated rest break) Assistive device: Left platform walker Gait Pattern/deviations: Step-to pattern;Trunk  flexed Gait velocity: decreased   General Gait Details: cues for posture and sequencing; grossly steady gait; min A for turning and stepping backwards   Stairs             Wheelchair Mobility    Modified Rankin (Stroke Patients Only)       Balance Overall balance assessment: Needs assistance Sitting-balance support: Feet unsupported;No upper extremity supported Sitting balance-Leahy Scale: Good     Standing balance support: Single extremity supported;During functional activity;Bilateral upper extremity supported Standing balance-Leahy Scale: Poor Standing balance comment: reliant on support from LUE through elbow while standing at sink level to complete oral care                            Cognition Arousal/Alertness: Awake/alert Behavior During Therapy: Flat affect Overall Cognitive Status: No family/caregiver present to determine baseline cognitive functioning Area of Impairment: Problem solving;Safety/judgement;Awareness;Following commands                       Following Commands: Follows one step commands with increased time Safety/Judgement: Decreased awareness of safety;Decreased awareness of deficits Awareness: Emergent Problem Solving: Requires verbal cues;Difficulty sequencing;Requires tactile cues General Comments: pt continues to report feeling depressed, states she "wants to go outside"      Exercises      General Comments        Pertinent Vitals/Pain Pain Assessment: No/denies pain Pain Intervention(s): Monitored during session    Home Living  Prior Function            PT Goals (current goals can now be found in the care plan section) Acute Rehab PT Goals Patient Stated Goal: to be able to go outside Progress towards PT goals: Progressing toward goals    Frequency    Min 3X/week      PT Plan Current plan remains appropriate    Co-evaluation              AM-PAC PT "6  Clicks" Mobility   Outcome Measure  Help needed turning from your back to your side while in a flat bed without using bedrails?: None Help needed moving from lying on your back to sitting on the side of a flat bed without using bedrails?: None Help needed moving to and from a bed to a chair (including a wheelchair)?: A Little Help needed standing up from a chair using your arms (e.g., wheelchair or bedside chair)?: A Little Help needed to walk in hospital room?: A Little Help needed climbing 3-5 steps with a railing? : A Lot 6 Click Score: 19    End of Session Equipment Utilized During Treatment: Gait belt Activity Tolerance: Patient tolerated treatment well Patient left: in bed;with call bell/phone within reach Nurse Communication: Mobility status PT Visit Diagnosis: Other abnormalities of gait and mobility (R26.89);Pain Pain - Right/Left: Left Pain - part of body: Hand     Time: 3710-6269 PT Time Calculation (min) (ACUTE ONLY): 26 min  Charges:  $Gait Training: 23-37 mins                     Earney Navy, PTA Acute Rehabilitation Services Pager: [phone] Office: [phone]     Darliss Cheney 12/20/2018, 3:35 PM

## 2018-12-21 LAB — GLUCOSE, CAPILLARY
Glucose-Capillary: 273 mg/dL — ABNORMAL HIGH (ref 70–99)
Glucose-Capillary: 212 mg/dL — ABNORMAL HIGH (ref 70–99)
Glucose-Capillary: 362 mg/dL — ABNORMAL HIGH (ref 70–99)
Glucose-Capillary: 161 mg/dL — ABNORMAL HIGH (ref 70–99)

## 2018-12-21 LAB — VANCOMYCIN, TROUGH: Vancomycin Tr: 11 ug/mL — ABNORMAL LOW (ref 15–20)

## 2018-12-21 MED ORDER — INSULIN GLARGINE 100 UNIT/ML ~~LOC~~ SOLN
24.0000 [IU] | Freq: Two times a day (BID) | SUBCUTANEOUS | Status: DC
  Administered 2018-12-21 – 2018-12-25 (×8): 24 [IU] via SUBCUTANEOUS
  Filled 2018-12-21 (×9): qty 0.24

## 2018-12-21 NOTE — Progress Notes (Signed)
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  She presented on 11/28/2018 with worsening pain and swelling of her right foot.  CT scan showed destruction of the entire mid and hindfoot with fluid and gas collections with surrounding cellulitis.  [facility].  She was started on intravenous antibiotics.  ID and orthopedics were consulted.  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  ID recommends IV vancomycin till 12/25/2018.  She is waiting for SNF placement.   Subjective: She has no complaints.   Assessment & Plan:   Principal Problem:   MRSA bacteremia Diabetic right foot infection/osteomyelitis - 5/20 > blood cultures MRSA - 5/22> transtibial amputation - 5/22 > TEE- no endocarditis - ID recommend 4 wks of IV Vancomycin stop date 12/25/18 - remove RUE PICC line afterwards - still awaiting SNF placement  Left wrist/ forearm abscess - I and D 12/07/18- culture shows MRSA - last ortho eval was on 6/5- plan daily dressing changes, f/u in office for suture and staple removal - non weight bearing on left wrist     Diabetes mellitus type 2, uncontrolled - A1c 7.9- cont Lantus and Novolog - sugars elevated- will increase Lantus today    Severe protein-calorie malnutrition -cont supplements   Essential HTN - cont Amlodipine  Acute on chronic anemia- ? Acute blood loss - s/p 1 u PRBC for Hb of 7.4 at outside hospital    Time spent in minutes: 35 min DVT prophylaxis: Heparin Code Status: Full code Family Communication:  Disposition  Plan: awaiting SNF bed- spoke with social work today Consultants:   Ortho  ID Procedures:  Transtibial amputation right lower extremity 11/30/2018 I&D of left wrist/elbow on 12/07/2018 PICC line placement, right upper extremity 12/04/2018 Antimicrobials:  Anti-infectives (From admission, onward)   Start     Dose/Rate Route Frequency Ordered Stop   12/10/18 1000  vancomycin (VANCOCIN) IVPB 750 mg/150 ml premix     750 mg 150 mL/hr over 60 Minutes Intravenous Every 12 hours 12/10/18 0136     12/07/18 0600  ceFAZolin (ANCEF) IVPB 2g/100 mL premix     2 g 200 mL/hr over 30 Minutes Intravenous On call to O.R. 12/06/18 2202 12/07/18 1236   12/05/18 2200  vancomycin (VANCOCIN) IVPB 1000 mg/200 mL premix  Status:  Discontinued     1,000 mg 200 mL/hr over 60 Minutes Intravenous Every 12 hours 12/05/18 1355 12/10/18 0136   12/01/18 1330  vancomycin (VANCOCIN) 1,250 mg in sodium chloride 0.9 % 250 mL IVPB  Status:  Discontinued     1,250 mg 166.7 mL/hr over 90 Minutes Intravenous Every 12 hours 12/01/18 1310 12/05/18 1355   11/30/18 1500  ceFEPIme (MAXIPIME) 2 g in sodium chloride 0.9 % 100 mL IVPB  Status:  Discontinued     2 g 200 mL/hr over 30 Minutes Intravenous Every 8 hours 11/30/18 1416 12/03/18 1254   11/30/18 1500  metroNIDAZOLE (FLAGYL) tablet 500 mg  Status:  Discontinued     500 mg Oral Every 8 hours 11/30/18 1416 12/03/18 1254   11/30/18 1415  metroNIDAZOLE (FLAGYL) IVPB 500 mg  Status:  Discontinued  500 mg 100 mL/hr over 60 Minutes Intravenous Every 8 hours 11/30/18 1411 11/30/18 1416   11/28/18 2045  vancomycin (VANCOCIN) 1,750 mg in sodium chloride 0.9 % 500 mL IVPB  Status:  Discontinued     1,750 mg 250 mL/hr over 120 Minutes Intravenous Every 24 hours 11/28/18 2032 12/01/18 1310   11/28/18 2030  vancomycin (VANCOCIN) 1,250 mg in sodium chloride 0.9 % 250 mL IVPB  Status:  Discontinued     1,250 mg 166.7 mL/hr over 90 Minutes Intravenous Every 24 hours 11/28/18 2004  11/28/18 2032   11/28/18 1900  ceFEPIme (MAXIPIME) 2 g in sodium chloride 0.9 % 100 mL IVPB  Status:  Discontinued     2 g 200 mL/hr over 30 Minutes Intravenous Every 8 hours 11/28/18 1848 11/30/18 1304   11/28/18 1815  metroNIDAZOLE (FLAGYL) tablet 500 mg  Status:  Discontinued     500 mg Oral Every 8 hours 11/28/18 1803 11/30/18 1304       Objective: Vitals:   12/20/18 1310 12/20/18 2204 12/21/18 0520 12/21/18 0554  BP: 138/85 132/78  138/84  Pulse: (!) 109 97  100  Resp: 18 18  17   Temp: 97.6 F (36.4 C) 98.2 F (36.8 C)  98 F (36.7 C)  TempSrc: Oral Oral  Oral  SpO2: 100% 100%  100%  Weight:   67.9 kg   Height:        Intake/Output Summary (Last 24 hours) at 12/21/2018 1322 Last data filed at 12/21/2018 0859 Gross per 24 hour  Intake 240 ml  Output 400 ml  Net -160 ml   Filed Weights   12/19/18 0500 12/20/18 0500 12/21/18 0520  Weight: 68 kg 68 kg 67.9 kg    Examination: General exam: Appears comfortable  HEENT: PERRLA, oral mucosa moist, no sclera icterus or thrush Respiratory system: Clear to auscultation. Respiratory effort normal. Cardiovascular system: S1 & S2 heard,  No murmurs  Gastrointestinal system: Abdomen soft, non-tender, nondistended. Normal bowel sounds   Central nervous system: Alert and oriented. No focal neurological deficits. Extremities: No cyanosis, clubbing or edema- right BKA Skin: No rashes or ulcers- left wrist wound is clean Psychiatry:  Mood & affect appropriate.    Data Reviewed: I have personally reviewed following labs and imaging studies  CBC: Recent Labs  Lab 12/16/18 0325 12/19/18 0432  WBC 7.8 6.8  NEUTROABS 5.4 3.6  HGB 8.9* 9.2*  HCT 28.3* 30.0*  MCV 100.7* 101.0*  PLT 294 462   Basic Metabolic Panel: Recent Labs  Lab 12/16/18 0325 12/19/18 0432  NA 135 136  K 4.1 4.0  CL 101 101  CO2 28 28  GLUCOSE 305* 165*  BUN 18 18  CREATININE 0.57 0.59  CALCIUM 9.1 9.6  MG 1.7 1.8   GFR: Estimated Creatinine  Clearance: 85.8 mL/min (by C-G formula based on SCr of 0.59 mg/dL). Liver Function Tests: No results for input(s): AST, ALT, ALKPHOS, BILITOT, PROT, ALBUMIN in the last 168 hours. No results for input(s): LIPASE, AMYLASE in the last 168 hours. No results for input(s): AMMONIA in the last 168 hours. Coagulation Profile: No results for input(s): INR, PROTIME in the last 168 hours. Cardiac Enzymes: No results for input(s): CKTOTAL, CKMB, CKMBINDEX, TROPONINI in the last 168 hours. BNP (last 3 results) No results for input(s): PROBNP in the last 8760 hours. HbA1C: No results for input(s): HGBA1C in the last 72 hours. CBG: Recent Labs  Lab 12/20/18 1135 12/20/18 1706 12/20/18 2203 12/21/18 0857 12/21/18 1211  GLUCAP  359* 281* 158* 273* 212*   Lipid Profile: No results for input(s): CHOL, HDL, LDLCALC, TRIG, CHOLHDL, LDLDIRECT in the last 72 hours. Thyroid Function Tests: No results for input(s): TSH, T4TOTAL, FREET4, T3FREE, THYROIDAB in the last 72 hours. Anemia Panel: No results for input(s): VITAMINB12, FOLATE, FERRITIN, TIBC, IRON, RETICCTPCT in the last 72 hours. Urine analysis:    Component Value Date/Time   COLORURINE YELLOW 11/29/2018 0653   APPEARANCEUR CLEAR 11/29/2018 0653   LABSPEC 1.010 11/29/2018 0653   PHURINE 5.5 11/29/2018 0653   GLUCOSEU NEGATIVE 11/29/2018 0653   HGBUR TRACE (A) 11/29/2018 0653   BILIRUBINUR SMALL (A) 11/29/2018 0653   KETONESUR NEGATIVE 11/29/2018 0653   PROTEINUR NEGATIVE 11/29/2018 0653   NITRITE NEGATIVE 11/29/2018 0653   LEUKOCYTESUR NEGATIVE 11/29/2018 0653   Sepsis Labs: @LABRCNTIP (procalcitonin:4,lacticidven:4) )No results found for this or any previous visit (from the past 240 hour(s)).       Radiology Studies: No results found.    Scheduled Meds: . amLODipine  10 mg Oral Daily  . docusate sodium  100 mg Oral BID  . heparin injection (subcutaneous)  5,000 Units Subcutaneous Q8H  . insulin aspart  0-15 Units  Subcutaneous TID WC  . insulin aspart  0-5 Units Subcutaneous QHS  . insulin aspart  5 Units Subcutaneous TID WC  . insulin glargine  22 Units Subcutaneous BID  . multivitamin with minerals  1 tablet Oral Daily  . Ensure Max Protein  11 oz Oral Daily   Continuous Infusions: . sodium chloride 250 mL (11/30/18 0650)  . sodium chloride 10 mL/hr at 12/21/18 0248  . lactated ringers 10 mL/hr at 12/07/18 1121  . vancomycin 750 mg (12/21/18 1003)     LOS: 23 days      Calvert CantorSaima Gualberto Wahlen, MD [facility] Pager: [email or web address] Password [email or web address] 12/21/2018, 1:22 PM

## 2018-12-21 NOTE — Progress Notes (Signed)
Physical Therapy Treatment Patient Details Name: Yolanda [name] MRN: [id number] DOB: Sep 18, 1975 Today's Date: 12/21/2018    History of Present Illness Pt is a 43 y.o. F with significant PMH of diabetes type 2, diabetic foot ulcer, who presents with sepsis with right foot infection, osteomyelitis and MRSA bacteremia and presumed endocarditis. 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 Pt now with L hand swelling and pain.      PT Comments    Patient seen for mobility progression. Pt continues to make progress toward PT goals and tolerated gait training distance of 50 ft this session. Pt requires min guard for squat pivot EOB to BSC and min A for sit to stand transfers with L platform RW. Continues to need frequent cues for maintaining L wrist NWB especially with bed mobility. Continue to progress as tolerated.     Follow Up Recommendations  SNF;Supervision/Assistance - 24 hour     Equipment Recommendations  Other (comment)(TBD )    Recommendations for Other Services       Precautions / Restrictions Precautions Precautions: Fall Precaution Comments: R BKA, NWB through L wrist  Required Braces or Orthoses: Other Brace Other Brace: R residual limb guard Restrictions Weight Bearing Restrictions: Yes RLE Weight Bearing: Non weight bearing Other Position/Activity Restrictions: NWB L wrist per orthopedic note    Mobility  Bed Mobility Overal bed mobility: Modified Independent                Transfers Overall transfer level: Needs assistance Equipment used: Left platform walker Transfers: Sit to/from Stand Sit to Stand: Min assist   Squat pivot transfers: Min guard     General transfer comment: min guard for safety for squat pivot EOB to BSC and min [facility] to [facility] and [facility]; cues for hand placement  Ambulation/Gait Ambulation/Gait assistance: Min guard;Min assist Gait Distance (Feet): 50 Feet Assistive device: Left platform walker Gait Pattern/deviations: Step-to  pattern Gait velocity: decreased   General Gait Details: cues for sequencing and maintaining safe proximity to RW; assist at times to steady   Stairs Stairs: Yes Stairs assistance: Mod assist Stair Management: No rails;Step to pattern;Backwards;With walker Number of Stairs: 1 General stair comments: cues for technique; assistance to power up and for balance to descend; pt unable to get foot all the way on to step for this to be a safe option for entering her parents' home at this time   Wheelchair Mobility    Modified Rankin (Stroke Patients Only)       Balance Overall balance assessment: Needs assistance Sitting-balance support: Feet unsupported;No upper extremity supported Sitting balance-Leahy Scale: Good     Standing balance support: Single extremity supported;During functional activity;Bilateral upper extremity supported Standing balance-Leahy Scale: Poor                              Cognition Arousal/Alertness: Awake/alert Behavior During Therapy: Flat affect Overall Cognitive Status: No family/caregiver present to determine baseline cognitive functioning Area of Impairment: Problem solving;Safety/judgement;Awareness;Following commands                       Following Commands: Follows one step commands consistently Safety/Judgement: Decreased awareness of safety;Decreased awareness of deficits Awareness: Emergent Problem Solving: Requires verbal cues;Difficulty sequencing        Exercises      General Comments        Pertinent Vitals/Pain Pain Assessment: No/denies pain    Home Living  Prior Function            PT Goals (current goals can now be found in the care plan section) Acute Rehab PT Goals Patient Stated Goal: to be able to go outside Progress towards PT goals: Progressing toward goals    Frequency    Min 3X/week      PT Plan Current plan remains appropriate    Co-evaluation               AM-PAC PT "6 Clicks" Mobility   Outcome Measure  Help needed turning from your back to your side while in a flat bed without using bedrails?: None Help needed moving from lying on your back to sitting on the side of a flat bed without using bedrails?: None Help needed moving to and from a bed to a chair (including a wheelchair)?: A Little Help needed standing up from a chair using your arms (e.g., wheelchair or bedside chair)?: A Little Help needed to walk in hospital room?: A Little Help needed climbing 3-5 steps with a railing? : A Lot 6 Click Score: 19    End of Session Equipment Utilized During Treatment: Gait belt Activity Tolerance: Patient tolerated treatment well Patient left: in bed;with call bell/phone within reach;with bed alarm set Nurse Communication: Mobility status PT Visit Diagnosis: Other abnormalities of gait and mobility (R26.89);Pain Pain - Right/Left: Left Pain - part of body: Hand     Time: 1340-1413 PT Time Calculation (min) (ACUTE ONLY): 33 min  Charges:  $Gait Training: 23-37 mins                     Earney Navy, PTA Acute Rehabilitation Services Pager: [phone] Office: [phone]     Darliss Cheney 12/21/2018, 3:22 PM

## 2018-12-21 NOTE — Progress Notes (Signed)
Pharmacy Antibiotic Note  Yolanda [name] is a 43 y.o. female admitted on 11/28/2018 with diabetic foot infection.  Pharmacy has been consulted for vancomycin dosing. 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 Per MD note, she failed daptomycin and was transitioned to vancomycin (which was supposed to complete on 11/20/2018). 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       Vanc regimen last adjusted from 1gm to 750 mg IV q12h on 6/1 with AUC 584.   Target AUC 400-550.    Vanc peak level drawn after 6/11 am dose = 18 mcg/ml  About 4.5 hours after infusion. True peak ~25 mcg/ml   Vanc trough level missed prior to 6/11 pm dose.   Vanc trough level prior 6/12 am dose = 11 mcg/ml   AUC predicted 438, calculated AUC 415; in target range.  Plan: Continue Vancomycin 750 mg IV q12hrs. Vancomycin end date 12/25/18. No further levels planned. Q3 day bmet in am.  Height: 5\' 6"  (167.6 cm) Weight: 149 lb 11.1 oz (67.9 kg) IBW/kg (Calculated) : 59.3  Temp (24hrs), Avg:98.1 F (36.7 C), Min:98 F (36.7 C), Max:98.2 F (36.8 C)  Recent Labs  Lab 12/16/18 0325 12/19/18 0432 12/20/18 1500 12/21/18 0914  WBC 7.8 6.8  --   --   CREATININE 0.57 0.59  --   --   VANCOTROUGH  --   --   --  11*  VANCOPEAK  --   --  18*  --     Estimated Creatinine Clearance: 85.8 mL/min (by C-G formula based on SCr of 0.59 mg/dL).    No Known Allergies  Arty Baumgartner Pager: [phone] or phone: [phone] 12/21/2018 2:01 PM

## 2018-12-21 NOTE — TOC Progression Note (Signed)
Transition of Care [facility]) - Progression Note    Patient Details  Name: Yolanda [name] MRN: [id number] Date of Birth: Dec 08, 1975  Transition of Care [facility]) CM/SW [place], LCSW Phone Number: 12/21/2018, 3:57 PM  Clinical Narrative:    Still no SNF beds available for patient.    Expected Discharge Plan: [place] Barriers to Discharge: Continued Medical Work up  Expected Discharge Plan and Services Expected Discharge Plan: [place] In-house Referral: Clinical Social Work Discharge Planning Services: NA Post Acute Care Choice: [place] Living arrangements for the past 2 months: Single Family Home                 DME Arranged: N/A DME Agency: NA       HH Arranged: NA HH Agency: NA         Social Determinants of Health (SDOH) Interventions    Readmission Risk Interventions No flowsheet data found.

## 2018-12-22 LAB — BASIC METABOLIC PANEL
Anion gap: 9 (ref 5–15)
BUN: 13 mg/dL (ref 6–20)
CO2: 24 mmol/L (ref 22–32)
Calcium: 9.4 mg/dL (ref 8.9–10.3)
Chloride: 103 mmol/L (ref 98–111)
Creatinine, Ser: 0.5 mg/dL (ref 0.44–1.00)
GFR calc Af Amer: >60 mL/min (ref >60)
GFR calc non Af Amer: >60 mL/min (ref >60)
Glucose, Bld: 167 mg/dL — ABNORMAL HIGH (ref 70–99)
Potassium: 3.6 mmol/L (ref 3.5–5.1)
Sodium: 136 mmol/L (ref 135–145)

## 2018-12-22 LAB — GLUCOSE, CAPILLARY
Glucose-Capillary: 132 mg/dL — ABNORMAL HIGH (ref 70–99)
Glucose-Capillary: 232 mg/dL — ABNORMAL HIGH (ref 70–99)
Glucose-Capillary: 274 mg/dL — ABNORMAL HIGH (ref 70–99)
Glucose-Capillary: 175 mg/dL — ABNORMAL HIGH (ref 70–99)

## 2018-12-22 NOTE — Plan of Care (Signed)
No variances this shift 

## 2018-12-22 NOTE — Progress Notes (Signed)
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  She presented on 11/28/2018 with worsening pain and swelling of her right foot.  CT scan showed destruction of the entire mid and hindfoot with fluid and gas collections with surrounding cellulitis.  [facility]  She was started on intravenous antibiotics.  ID and orthopedics were consulted.  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  ID recommends IV vancomycin till 12/25/2018.  She is waiting for SNF placement.   Subjective: She has no complaints.   Assessment & Plan:   Principal Problem:   MRSA bacteremia Diabetic right foot infection/osteomyelitis - 5/20 > blood cultures MRSA - 5/22> transtibial amputation - 5/22 > TEE- no endocarditis - ID recommend 4 wks of IV Vancomycin stop date 12/25/18 - remove RUE PICC line afterwards - still awaiting SNF placement  Left wrist/ forearm abscess - I and D 12/07/18- culture shows MRSA - last ortho eval was on 6/5- plan daily dressing changes, f/u in office for suture and staple removal - non weight bearing on left wrist     Diabetes mellitus type 2, uncontrolled - A1c 7.9- cont Lantus and Novolog - sugars elevated- will increase Lantus today    Severe protein-calorie malnutrition -cont supplements   Essential HTN - cont Amlodipine  Acute on chronic anemia- ? Acute blood loss - s/p 1 u PRBC for Hb of 7.4 at outside hospital - Hb now 8-9 range    Time spent in minutes: 35 min DVT prophylaxis: Heparin Code Status: Full code Family  Communication:  Disposition Plan: awaiting SNF bed-  Consultants:   Ortho  ID Procedures:  Transtibial amputation right lower extremity 11/30/2018 I&D of left wrist/elbow on 12/07/2018 PICC line placement, right upper extremity 12/04/2018 Antimicrobials:  Anti-infectives (From admission, onward)   Start     Dose/Rate Route Frequency Ordered Stop   12/10/18 1000  vancomycin (VANCOCIN) IVPB 750 mg/150 ml premix     750 mg 150 mL/hr over 60 Minutes Intravenous Every 12 hours 12/10/18 0136     12/07/18 0600  ceFAZolin (ANCEF) IVPB 2g/100 mL premix     2 g 200 mL/hr over 30 Minutes Intravenous On call to O.R. 12/06/18 2202 12/07/18 1236   12/05/18 2200  vancomycin (VANCOCIN) IVPB 1000 mg/200 mL premix  Status:  Discontinued     1,000 mg 200 mL/hr over 60 Minutes Intravenous Every 12 hours 12/05/18 1355 12/10/18 0136   12/01/18 1330  vancomycin (VANCOCIN) 1,250 mg in sodium chloride 0.9 % 250 mL IVPB  Status:  Discontinued     1,250 mg 166.7 mL/hr over 90 Minutes Intravenous Every 12 hours 12/01/18 1310 12/05/18 1355   11/30/18 1500  ceFEPIme (MAXIPIME) 2 g in sodium chloride 0.9 % 100 mL IVPB  Status:  Discontinued     2 g 200 mL/hr over 30 Minutes Intravenous Every 8 hours 11/30/18 1416 12/03/18 1254   11/30/18 1500  metroNIDAZOLE (FLAGYL) tablet 500 mg  Status:  Discontinued     500 mg Oral Every 8 hours 11/30/18 1416 12/03/18 1254   11/30/18 1415  metroNIDAZOLE (FLAGYL) IVPB 500 mg  Status:  Discontinued  500 mg 100 mL/hr over 60 Minutes Intravenous Every 8 hours 11/30/18 1411 11/30/18 1416   11/28/18 2045  vancomycin (VANCOCIN) 1,750 mg in sodium chloride 0.9 % 500 mL IVPB  Status:  Discontinued     1,750 mg 250 mL/hr over 120 Minutes Intravenous Every 24 hours 11/28/18 2032 12/01/18 1310   11/28/18 2030  vancomycin (VANCOCIN) 1,250 mg in sodium chloride 0.9 % 250 mL IVPB  Status:  Discontinued     1,250 mg 166.7 mL/hr over 90 Minutes Intravenous Every 24 hours 11/28/18 2004  11/28/18 2032   11/28/18 1900  ceFEPIme (MAXIPIME) 2 g in sodium chloride 0.9 % 100 mL IVPB  Status:  Discontinued     2 g 200 mL/hr over 30 Minutes Intravenous Every 8 hours 11/28/18 1848 11/30/18 1304   11/28/18 1815  metroNIDAZOLE (FLAGYL) tablet 500 mg  Status:  Discontinued     500 mg Oral Every 8 hours 11/28/18 1803 11/30/18 1304       Objective: Vitals:   12/21/18 0554 12/21/18 1820 12/22/18 0353 12/22/18 0815  BP: 138/84 122/74 137/80 133/81  Pulse: 100 (!) 104 97 98  Resp: 17  18   Temp: 98 F (36.7 C) 98.4 F (36.9 C) 98 F (36.7 C) 98.7 F (37.1 C)  TempSrc: Oral Oral Oral Oral  SpO2: 100% 97% 98% 100%  Weight:      Height:        Intake/Output Summary (Last 24 hours) at 12/22/2018 1348 Last data filed at 12/22/2018 0900 Gross per 24 hour  Intake 537 ml  Output 325 ml  Net 212 ml   Filed Weights   12/19/18 0500 12/20/18 0500 12/21/18 0520  Weight: 68 kg 68 kg 67.9 kg    Examination: General exam: Appears comfortable  HEENT: PERRLA, oral mucosa moist, no sclera icterus or thrush Respiratory system: Clear to auscultation. Respiratory effort normal. Cardiovascular system: S1 & S2 heard,  No murmurs  Gastrointestinal system: Abdomen soft, non-tender, nondistended. Normal bowel sounds   Central nervous system: Alert and oriented. No focal neurological deficits. Extremities: No cyanosis, clubbing or edema- right BKA Skin: No rashes or ulcers- left hand wound clean Psychiatry:  Mood & affect appropriate.    Data Reviewed: I have personally reviewed following labs and imaging studies  CBC: Recent Labs  Lab 12/16/18 0325 12/19/18 0432  WBC 7.8 6.8  NEUTROABS 5.4 3.6  HGB 8.9* 9.2*  HCT 28.3* 30.0*  MCV 100.7* 101.0*  PLT 294 269   Basic Metabolic Panel: Recent Labs  Lab 12/16/18 0325 12/19/18 0432 12/22/18 0351  NA 135 136 136  K 4.1 4.0 3.6  CL 101 101 103  CO2 28 28 24   GLUCOSE 305* 165* 167*  BUN 18 18 13   CREATININE 0.57 0.59 0.50   CALCIUM 9.1 9.6 9.4  MG 1.7 1.8  --    GFR: Estimated Creatinine Clearance: 85.8 mL/min (by C-G formula based on SCr of 0.5 mg/dL). Liver Function Tests: No results for input(s): AST, ALT, ALKPHOS, BILITOT, PROT, ALBUMIN in the last 168 hours. No results for input(s): LIPASE, AMYLASE in the last 168 hours. No results for input(s): AMMONIA in the last 168 hours. Coagulation Profile: No results for input(s): INR, PROTIME in the last 168 hours. Cardiac Enzymes: No results for input(s): CKTOTAL, CKMB, CKMBINDEX, TROPONINI in the last 168 hours. BNP (last 3 results) No results for input(s): PROBNP in the last 8760 hours. HbA1C: No results for input(s): HGBA1C in the last 72 hours. CBG: Recent Labs  Lab 12/21/18 1211 12/21/18 1633 12/21/18 2230 12/22/18 0740 12/22/18 1214  GLUCAP 212* 362* 161* 132* 232*   Lipid Profile: No results for input(s): CHOL, HDL, LDLCALC, TRIG, CHOLHDL, LDLDIRECT in the last 72 hours. Thyroid Function Tests: No results for input(s): TSH, T4TOTAL, FREET4, T3FREE, THYROIDAB in the last 72 hours. Anemia Panel: No results for input(s): VITAMINB12, FOLATE, FERRITIN, TIBC, IRON, RETICCTPCT in the last 72 hours. Urine analysis:    Component Value Date/Time   COLORURINE YELLOW 11/29/2018 0653   APPEARANCEUR CLEAR 11/29/2018 0653   LABSPEC 1.010 11/29/2018 0653   PHURINE 5.5 11/29/2018 0653   GLUCOSEU NEGATIVE 11/29/2018 0653   HGBUR TRACE (A) 11/29/2018 0653   BILIRUBINUR SMALL (A) 11/29/2018 0653   KETONESUR NEGATIVE 11/29/2018 0653   PROTEINUR NEGATIVE 11/29/2018 0653   NITRITE NEGATIVE 11/29/2018 0653   LEUKOCYTESUR NEGATIVE 11/29/2018 0653   Sepsis Labs: @LABRCNTIP (procalcitonin:4,lacticidven:4) )No results found for this or any previous visit (from the past 240 hour(s)).       Radiology Studies: No results found.    Scheduled Meds: . amLODipine  10 mg Oral Daily  . docusate sodium  100 mg Oral BID  . heparin injection (subcutaneous)   5,000 Units Subcutaneous Q8H  . insulin aspart  0-15 Units Subcutaneous TID WC  . insulin aspart  0-5 Units Subcutaneous QHS  . insulin aspart  5 Units Subcutaneous TID WC  . insulin glargine  24 Units Subcutaneous BID  . multivitamin with minerals  1 tablet Oral Daily  . Ensure Max Protein  11 oz Oral Daily   Continuous Infusions: . sodium chloride 250 mL (11/30/18 0650)  . sodium chloride 10 mL/hr at 12/21/18 0248  . lactated ringers 10 mL/hr at 12/07/18 1121  . vancomycin 750 mg (12/22/18 1033)     LOS: 24 days      Calvert CantorSaima Lashay Osborne, MD [facility] Pager: [email or web address] Password [email or web address] 12/22/2018, 1:48 PM

## 2018-12-23 LAB — GLUCOSE, CAPILLARY
Glucose-Capillary: 338 mg/dL — ABNORMAL HIGH (ref 70–99)
Glucose-Capillary: 365 mg/dL — ABNORMAL HIGH (ref 70–99)
Glucose-Capillary: 115 mg/dL — ABNORMAL HIGH (ref 70–99)
Glucose-Capillary: 118 mg/dL — ABNORMAL HIGH (ref 70–99)

## 2018-12-23 MED ORDER — DIPHENHYDRAMINE HCL 25 MG PO CAPS
25.0000 mg | ORAL_CAPSULE | Freq: Once | ORAL | Status: AC
  Administered 2018-12-23: 25 mg via ORAL
  Filled 2018-12-23: qty 1

## 2018-12-23 NOTE — Progress Notes (Signed)
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  She presented on 11/28/2018 with worsening pain and swelling of her right foot.  CT scan showed destruction of the entire mid and hindfoot with fluid and gas collections with surrounding cellulitis.  [facility]  She was started on intravenous antibiotics.  ID and orthopedics were consulted.  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  ID recommends IV vancomycin till 12/25/2018.  She is waiting for SNF placement.   Subjective: She has no complaints.   Assessment & Plan:   Principal Problem:   MRSA bacteremia Diabetic right foot infection/osteomyelitis - 5/20 > blood cultures MRSA - 5/22> transtibial amputation - 5/22 > TEE- no endocarditis - ID recommend 4 wks of IV Vancomycin stop date 12/25/18 - remove RUE PICC line afterwards   Left wrist/ forearm abscess - I and D 12/07/18- culture shows MRSA - last ortho eval was on 6/5- plan daily dressing changes, f/u in office for suture and staple removal - non weight bearing on left wrist     Diabetes mellitus type 2, uncontrolled - A1c 7.9- cont Lantus and Novolog    Severe protein-calorie malnutrition -cont supplements   Essential HTN - cont Amlodipine  Acute on chronic anemia- ? Acute blood loss - s/p 1 u PRBC for Hb of 7.4 at outside hospital - Hb now 8-9 range    Time spent in minutes: 35 min DVT prophylaxis: Heparin Code Status: Full code Family Communication:  Disposition Plan: awaiting SNF bed-  Consultants:   Ortho  ID  Procedures:  Transtibial amputation right lower extremity 11/30/2018 I&D of left wrist/elbow on 12/07/2018 PICC line placement, right upper extremity 12/04/2018 Antimicrobials:  Anti-infectives (From admission, onward)   Start     Dose/Rate Route Frequency Ordered Stop   12/10/18 1000  vancomycin (VANCOCIN) IVPB 750 mg/150 ml premix     750 mg 150 mL/hr over 60 Minutes Intravenous Every 12 hours 12/10/18 0136     12/07/18 0600  ceFAZolin (ANCEF) IVPB 2g/100 mL premix     2 g 200 mL/hr over 30 Minutes Intravenous On call to O.R. 12/06/18 2202 12/07/18 1236   12/05/18 2200  vancomycin (VANCOCIN) IVPB 1000 mg/200 mL premix  Status:  Discontinued     1,000 mg 200 mL/hr over 60 Minutes Intravenous Every 12 hours 12/05/18 1355 12/10/18 0136   12/01/18 1330  vancomycin (VANCOCIN) 1,250 mg in sodium chloride 0.9 % 250 mL IVPB  Status:  Discontinued     1,250 mg 166.7 mL/hr over 90 Minutes Intravenous Every 12 hours 12/01/18 1310 12/05/18 1355   11/30/18 1500  ceFEPIme (MAXIPIME) 2 g in sodium chloride 0.9 % 100 mL IVPB  Status:  Discontinued     2 g 200 mL/hr over 30 Minutes Intravenous Every 8 hours 11/30/18 1416 12/03/18 1254   11/30/18 1500  metroNIDAZOLE (FLAGYL) tablet 500 mg  Status:  Discontinued     500 mg Oral Every 8 hours 11/30/18 1416 12/03/18 1254   11/30/18 1415  metroNIDAZOLE (FLAGYL) IVPB 500 mg  Status:  Discontinued     500 mg 100 mL/hr over 60 Minutes Intravenous Every 8  hours 11/30/18 1411 11/30/18 1416   11/28/18 2045  vancomycin (VANCOCIN) 1,750 mg in sodium chloride 0.9 % 500 mL IVPB  Status:  Discontinued     1,750 mg 250 mL/hr over 120 Minutes Intravenous Every 24 hours 11/28/18 2032 12/01/18 1310   11/28/18 2030  vancomycin (VANCOCIN) 1,250 mg in sodium chloride 0.9 % 250 mL IVPB  Status:  Discontinued     1,250 mg 166.7 mL/hr over 90 Minutes Intravenous Every 24 hours 11/28/18 2004 11/28/18 2032   11/28/18 1900  ceFEPIme (MAXIPIME) 2 g in sodium chloride 0.9 % 100 mL  IVPB  Status:  Discontinued     2 g 200 mL/hr over 30 Minutes Intravenous Every 8 hours 11/28/18 1848 11/30/18 1304   11/28/18 1815  metroNIDAZOLE (FLAGYL) tablet 500 mg  Status:  Discontinued     500 mg Oral Every 8 hours 11/28/18 1803 11/30/18 1304       Objective: Vitals:   12/22/18 1851 12/22/18 2129 12/23/18 0535 12/23/18 0714  BP: 132/89 123/69 (!) 148/82   Pulse: (!) 108 (!) 101 (!) 106   Resp:  16 16   Temp:  98.7 F (37.1 C) 98.5 F (36.9 C)   TempSrc:  Oral Oral   SpO2:  100% 100%   Weight:    68.6 kg  Height:        Intake/Output Summary (Last 24 hours) at 12/23/2018 1246 Last data filed at 12/22/2018 1500 Gross per 24 hour  Intake 480 ml  Output -  Net 480 ml   Filed Weights   12/20/18 0500 12/21/18 0520 12/23/18 0714  Weight: 68 kg 67.9 kg 68.6 kg    Examination: General exam: Appears comfortable  HEENT: PERRLA, oral mucosa moist, no sclera icterus or thrush Respiratory system: Clear to auscultation. Respiratory effort normal. Cardiovascular system: S1 & S2 heard,  No murmurs  Gastrointestinal system: Abdomen soft, non-tender, nondistended. Normal bowel sounds   Central nervous system: Alert and oriented. No focal neurological deficits. Extremities: No cyanosis, clubbing or edema Skin: No rashes or ulcers Psychiatry:  Mood & affect appropriate.    Data Reviewed: I have personally reviewed following labs and imaging studies  CBC: Recent Labs  Lab 12/19/18 0432  WBC 6.8  NEUTROABS 3.6  HGB 9.2*  HCT 30.0*  MCV 101.0*  PLT 109   Basic Metabolic Panel: Recent Labs  Lab 12/19/18 0432 12/22/18 0351  NA 136 136  K 4.0 3.6  CL 101 103  CO2 28 24  GLUCOSE 165* 167*  BUN 18 13  CREATININE 0.59 0.50  CALCIUM 9.6 9.4  MG 1.8  --    GFR: Estimated Creatinine Clearance: 85.8 mL/min (by C-G formula based on SCr of 0.5 mg/dL). Liver Function Tests: No results for input(s): AST, ALT, ALKPHOS, BILITOT, PROT, ALBUMIN in the last 168 hours. No  results for input(s): LIPASE, AMYLASE in the last 168 hours. No results for input(s): AMMONIA in the last 168 hours. Coagulation Profile: No results for input(s): INR, PROTIME in the last 168 hours. Cardiac Enzymes: No results for input(s): CKTOTAL, CKMB, CKMBINDEX, TROPONINI in the last 168 hours. BNP (last 3 results) No results for input(s): PROBNP in the last 8760 hours. HbA1C: No results for input(s): HGBA1C in the last 72 hours. CBG: Recent Labs  Lab 12/22/18 1214 12/22/18 1651 12/22/18 2131 12/23/18 0803 12/23/18 1212  GLUCAP 232* 274* 175* 338* 365*   Lipid Profile: No results for input(s): CHOL, HDL, LDLCALC, TRIG, CHOLHDL, LDLDIRECT in the last 72 hours.  Thyroid Function Tests: No results for input(s): TSH, T4TOTAL, FREET4, T3FREE, THYROIDAB in the last 72 hours. Anemia Panel: No results for input(s): VITAMINB12, FOLATE, FERRITIN, TIBC, IRON, RETICCTPCT in the last 72 hours. Urine analysis:    Component Value Date/Time   COLORURINE YELLOW 11/29/2018 0653   APPEARANCEUR CLEAR 11/29/2018 0653   LABSPEC 1.010 11/29/2018 0653   PHURINE 5.5 11/29/2018 0653   GLUCOSEU NEGATIVE 11/29/2018 0653   HGBUR TRACE (A) 11/29/2018 0653   BILIRUBINUR SMALL (A) 11/29/2018 0653   KETONESUR NEGATIVE 11/29/2018 0653   PROTEINUR NEGATIVE 11/29/2018 0653   NITRITE NEGATIVE 11/29/2018 0653   LEUKOCYTESUR NEGATIVE 11/29/2018 0653   Sepsis Labs: @LABRCNTIP (procalcitonin:4,lacticidven:4) )No results found for this or any previous visit (from the past 240 hour(s)).       Radiology Studies: No results found.    Scheduled Meds: . amLODipine  10 mg Oral Daily  . docusate sodium  100 mg Oral BID  . heparin injection (subcutaneous)  5,000 Units Subcutaneous Q8H  . insulin aspart  0-15 Units Subcutaneous TID WC  . insulin aspart  0-5 Units Subcutaneous QHS  . insulin aspart  5 Units Subcutaneous TID WC  . insulin glargine  24 Units Subcutaneous BID  . multivitamin with minerals   1 tablet Oral Daily  . Ensure Max Protein  11 oz Oral Daily   Continuous Infusions: . sodium chloride 250 mL (11/30/18 0650)  . sodium chloride 10 mL/hr at 12/21/18 0248  . lactated ringers 10 mL/hr at 12/07/18 1121  . vancomycin 750 mg (12/23/18 1132)     LOS: 25 days      Calvert CantorSaima Kiera Hussey, MD [facility] Pager: [email or web address] Password [email or web address] 12/23/2018, 12:46 PM

## 2018-12-24 LAB — GLUCOSE, CAPILLARY
Glucose-Capillary: 170 mg/dL — ABNORMAL HIGH (ref 70–99)
Glucose-Capillary: 254 mg/dL — ABNORMAL HIGH (ref 70–99)
Glucose-Capillary: 160 mg/dL — ABNORMAL HIGH (ref 70–99)
Glucose-Capillary: 194 mg/dL — ABNORMAL HIGH (ref 70–99)

## 2018-12-24 NOTE — Progress Notes (Signed)
Pharmacy Antibiotic Note  Yolanda [name] is a 43 y.o. female admitted on 11/28/2018 with diabetic foot infection.  Pharmacy has been consulted for vancomycin dosing. 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 Per MD note, she failed daptomycin and was transitioned to vancomycin (which was supposed to complete on 11/20/2018). 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 Planned duration through 6/16.   The patient's last calculated AUC was within goal range on 6/11. Renal function remains stable - current dosing is appropriate.   Plan: Continue Vancomycin 750 mg IV q12hrs. Vancomycin end date 12/25/18. No further levels planned. Q3 day bmet in am.  Height: 5\' 6"  (167.6 cm) Weight: 149 lb 0.5 oz (67.6 kg) IBW/kg (Calculated) : 59.3  Temp (24hrs), Avg:98.5 F (36.9 C), Min:98.2 F (36.8 C), Max:98.7 F (37.1 C)  Recent Labs  Lab 12/19/18 0432 12/20/18 1500 12/21/18 0914 12/22/18 0351  WBC 6.8  --   --   --   CREATININE 0.59  --   --  0.50  VANCOTROUGH  --   --  11*  --   VANCOPEAK  --  18*  --   --     Estimated Creatinine Clearance: 85.8 mL/min (by C-G formula based on SCr of 0.5 mg/dL).    No Known Allergies  Thank you for allowing pharmacy to be a part of this patient's care.  Alycia Rossetti, PharmD, BCPS Clinical Pharmacist Clinical phone for 12/24/2018: [phone] 12/24/2018 1:54 PM   **Pharmacist phone directory can now be found on [email or web address] (PW [email or web address]).  Listed under [place].

## 2018-12-24 NOTE — Progress Notes (Signed)
Inpatient Diabetes Program Recommendations  AACE/ADA: New Consensus Statement on Inpatient Glycemic Control (2015)  Target Ranges:  Prepandial:   less than 140 mg/dL      Peak postprandial:   less than 180 mg/dL (1-2 hours)      Critically ill patients:  140 - 180 mg/dL   Results for Yolanda [name], Yolanda [name] (MRN [id number]) as of 12/24/2018 12:42  Ref. Range 12/22/2018 07:40 12/22/2018 12:14 12/22/2018 16:51 12/22/2018 21:31  Glucose-Capillary Latest Ref Range: 70 - 99 mg/dL 132 (H) 232 (H) 274 (H) 175 (H)   Results for Yolanda [name], Yolanda [name] (MRN [id number]) as of 12/24/2018 12:42  Ref. Range 12/23/2018 08:03 12/23/2018 12:12 12/23/2018 18:22 12/23/2018 22:26 12/24/2018 07:58 12/24/2018 12:22  Glucose-Capillary Latest Ref Range: 70 - 99 mg/dL 338 (H) 365 (H) 115 (H) 118 (H) 170 (H) 254 (H)    Review of Glycemic Control  Diabetes history: DM 2 Outpatient Diabetes medications: Tresiba 70 units bid, Humalog 30 units tid   Current orders for Inpatient glycemic control:  Lantus 24 units bid Novolog 0-15 units tid + 0-5 units qhs Novolog 5 units tid meal coverage  Inpatient Diabetes Program Recommendations:    Glucose increasing after meals. Consider increasing Novolog meal coverage to 8 units tid if patient is consuming at least 50% of meals.  Thanks,  Tama Headings RN, MSN, BC-ADM Inpatient Diabetes Coordinator Team Pager [phone] (8a-5p)

## 2018-12-24 NOTE — Progress Notes (Addendum)
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  She presented on 11/28/2018 with worsening pain and swelling of her right foot.  CT scan showed destruction of the entire mid and hindfoot with fluid and gas collections with surrounding cellulitis.  [facility]  She was started on intravenous antibiotics.  ID and orthopedics were consulted.  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  ID recommends IV vancomycin till 12/25/2018.  She is waiting for SNF placement.   Subjective: She has no complaints today.  Assessment & Plan:   Principal Problem:   MRSA bacteremia Diabetic right foot infection/osteomyelitis - 5/20 > blood cultures MRSA - 5/22> right transtibial amputation - 5/22 > TEE- no endocarditis - ID recommend 4 wks of IV Vancomycin stop date 12/25/18 - remove RUE PICC line afterwards - Dr Lajoyce Cornersuda recommends she f/u in his office towards the end of the week to have a f/u and staples removed   Left wrist/ forearm abscess - I and D 12/07/18- culture shows MRSA - last ortho eval was on 6/5- plan daily dressing changes, f/u in office for suture and staple removal - non weight bearing on left wrist     Diabetes mellitus type 2, uncontrolled - A1c 7.9- cont Lantus and Novolog    Severe protein-calorie malnutrition -cont supplements   Essential HTN - cont Amlodipine  Acute on chronic anemia- ? Acute blood loss - s/p 1 u PRBC for Hb of 7.4 at outside hospital - Hb now 8-9 range    Time spent in minutes: 35 min DVT prophylaxis: Heparin  Code Status: Full code Family Communication:  Disposition Plan: awaiting SNF bed-  Consultants:   Ortho  ID Procedures:  Transtibial amputation right lower extremity 11/30/2018 I&D of left wrist/elbow on 12/07/2018 PICC line placement, right upper extremity 12/04/2018 Antimicrobials:  Anti-infectives (From admission, onward)   Start     Dose/Rate Route Frequency Ordered Stop   12/10/18 1000  vancomycin (VANCOCIN) IVPB 750 mg/150 ml premix     750 mg 150 mL/hr over 60 Minutes Intravenous Every 12 hours 12/10/18 0136 12/25/18 2359   12/07/18 0600  ceFAZolin (ANCEF) IVPB 2g/100 mL premix     2 g 200 mL/hr over 30 Minutes Intravenous On call to O.R. 12/06/18 2202 12/07/18 1236   12/05/18 2200  vancomycin (VANCOCIN) IVPB 1000 mg/200 mL premix  Status:  Discontinued     1,000 mg 200 mL/hr over 60 Minutes Intravenous Every 12 hours 12/05/18 1355 12/10/18 0136   12/01/18 1330  vancomycin (VANCOCIN) 1,250 mg in sodium chloride 0.9 % 250 mL IVPB  Status:  Discontinued     1,250 mg 166.7 mL/hr over 90 Minutes Intravenous Every 12 hours 12/01/18 1310 12/05/18 1355   11/30/18 1500  ceFEPIme (MAXIPIME) 2 g in sodium chloride 0.9 % 100 mL IVPB  Status:  Discontinued     2 g 200 mL/hr over 30 Minutes Intravenous Every 8 hours 11/30/18 1416 12/03/18 1254   11/30/18 1500  metroNIDAZOLE (FLAGYL) tablet 500 mg  Status:  Discontinued     500 mg Oral Every 8 hours 11/30/18 1416 12/03/18 1254   11/30/18 1415  metroNIDAZOLE (FLAGYL) IVPB 500 mg  Status:  Discontinued     500 mg 100 mL/hr over 60 Minutes Intravenous Every 8 hours 11/30/18 1411 11/30/18 1416   11/28/18 2045  vancomycin (VANCOCIN) 1,750 mg in sodium chloride 0.9 % 500 mL IVPB  Status:  Discontinued     1,750 mg 250 mL/hr over 120 Minutes Intravenous Every 24 hours 11/28/18 2032 12/01/18 1310   11/28/18 2030  vancomycin (VANCOCIN) 1,250 mg in sodium chloride 0.9 % 250 mL IVPB  Status:  Discontinued     1,250 mg 166.7 mL/hr over 90 Minutes  Intravenous Every 24 hours 11/28/18 2004 11/28/18 2032   11/28/18 1900  ceFEPIme (MAXIPIME) 2 g in sodium chloride 0.9 % 100 mL IVPB  Status:  Discontinued     2 g 200 mL/hr over 30 Minutes Intravenous Every 8 hours 11/28/18 1848 11/30/18 1304   11/28/18 1815  metroNIDAZOLE (FLAGYL) tablet 500 mg  Status:  Discontinued     500 mg Oral Every 8 hours 11/28/18 1803 11/30/18 1304       Objective: Vitals:   12/24/18 0547 12/24/18 0701 12/24/18 1003 12/24/18 1334  BP: 138/82  (!) 143/89 135/84  Pulse: 99   99  Resp: 15  16 18   Temp: 98.3 F (36.8 C)   98.2 F (36.8 C)  TempSrc: Oral   Oral  SpO2: 100%  100% 100%  Weight:  67.6 kg    Height:        Intake/Output Summary (Last 24 hours) at 12/24/2018 1441 Last data filed at 12/24/2018 1000 Gross per 24 hour  Intake 1320 ml  Output -  Net 1320 ml   Filed Weights   12/21/18 0520 12/23/18 0714 12/24/18 0701  Weight: 67.9 kg 68.6 kg 67.6 kg    Examination: General exam: Appears comfortable  HEENT: PERRLA, oral mucosa moist, no sclera icterus or thrush Respiratory system: Clear to auscultation. Respiratory effort normal. Cardiovascular system: S1 & S2 heard,  No murmurs  Gastrointestinal system: Abdomen soft, non-tender, nondistended. Normal bowel sounds   Central nervous system: Alert and oriented. No focal neurological deficits. Extremities: No cyanosis, clubbing or edema- right BKA Skin: No rashes or ulcers  Psychiatry:  Mood & affect appropriate.   Data Reviewed: I have personally reviewed following labs and imaging studies  CBC: Recent Labs  Lab 12/19/18 0432  WBC 6.8  NEUTROABS 3.6  HGB 9.2*  HCT 30.0*  MCV 101.0*  PLT 371   Basic Metabolic Panel: Recent Labs  Lab 12/19/18 0432 12/22/18 0351  NA 136 136  K 4.0 3.6  CL 101 103  CO2 28 24  GLUCOSE 165* 167*  BUN 18 13  CREATININE 0.59 0.50  CALCIUM 9.6 9.4  MG 1.8  --    GFR: Estimated Creatinine Clearance: 85.8 mL/min (by C-G formula based on SCr of  0.5 mg/dL). Liver Function Tests: No results for input(s): AST, ALT, ALKPHOS, BILITOT, PROT, ALBUMIN in the last 168 hours. No results for input(s): LIPASE, AMYLASE in the last 168 hours. No results for input(s): AMMONIA in the last 168 hours. Coagulation Profile: No results for input(s): INR, PROTIME in the last 168 hours. Cardiac Enzymes: No results for input(s): CKTOTAL, CKMB, CKMBINDEX, TROPONINI in the last 168 hours. BNP (last 3 results) No results for input(s): PROBNP in the last 8760 hours. HbA1C: No results for input(s): HGBA1C in the last 72 hours. CBG: Recent Labs  Lab 12/23/18 1212 12/23/18 1822 12/23/18 2226 12/24/18 0758 12/24/18 1222  GLUCAP 365* 115*  118* 170* 254*   Lipid Profile: No results for input(s): CHOL, HDL, LDLCALC, TRIG, CHOLHDL, LDLDIRECT in the last 72 hours. Thyroid Function Tests: No results for input(s): TSH, T4TOTAL, FREET4, T3FREE, THYROIDAB in the last 72 hours. Anemia Panel: No results for input(s): VITAMINB12, FOLATE, FERRITIN, TIBC, IRON, RETICCTPCT in the last 72 hours. Urine analysis:    Component Value Date/Time   COLORURINE YELLOW 11/29/2018 0653   APPEARANCEUR CLEAR 11/29/2018 0653   LABSPEC 1.010 11/29/2018 0653   PHURINE 5.5 11/29/2018 0653   GLUCOSEU NEGATIVE 11/29/2018 0653   HGBUR TRACE (A) 11/29/2018 0653   BILIRUBINUR SMALL (A) 11/29/2018 0653   KETONESUR NEGATIVE 11/29/2018 0653   PROTEINUR NEGATIVE 11/29/2018 0653   NITRITE NEGATIVE 11/29/2018 0653   LEUKOCYTESUR NEGATIVE 11/29/2018 0653   Sepsis Labs: @LABRCNTIP (procalcitonin:4,lacticidven:4) )No results found for this or any previous visit (from the past 240 hour(s)).       Radiology Studies: No results found.    Scheduled Meds: . amLODipine  10 mg Oral Daily  . docusate sodium  100 mg Oral BID  . heparin injection (subcutaneous)  5,000 Units Subcutaneous Q8H  . insulin aspart  0-15 Units Subcutaneous TID WC  . insulin aspart  0-5 Units Subcutaneous  QHS  . insulin aspart  5 Units Subcutaneous TID WC  . insulin glargine  24 Units Subcutaneous BID  . multivitamin with minerals  1 tablet Oral Daily  . Ensure Max Protein  11 oz Oral Daily   Continuous Infusions: . sodium chloride 250 mL (11/30/18 0650)  . sodium chloride 10 mL/hr at 12/21/18 0248  . lactated ringers 10 mL/hr at 12/07/18 1121  . vancomycin 750 mg (12/24/18 1013)     LOS: 26 days      Calvert CantorSaima Guhan [place], MD [facility] Pager: [email or web address] Password [email or web address] 12/24/2018, 2:41 PM

## 2018-12-24 NOTE — TOC Progression Note (Signed)
Transition of Care [facility]) - Progression Note    Patient Details  Name: Yolanda [name] MRN: [id number] Date of Birth: 1976-04-15  Transition of Care [facility]) CM/SW [place], LCSW Phone Number: 12/24/2018, 5:16 PM  Clinical Narrative:    CSW contacted [facility], [place], [place]. Still no beds available.    Expected Discharge Plan: [place] Barriers to Discharge: Continued Medical Work up  Expected Discharge Plan and Services Expected Discharge Plan: [place] In-house Referral: Clinical Social Work Discharge Planning Services: NA Post Acute Care Choice: [place] Living arrangements for the past 2 months: Single Family Home                 DME Arranged: N/A DME Agency: NA       HH Arranged: NA HH Agency: NA         Social Determinants of Health (SDOH) Interventions    Readmission Risk Interventions No flowsheet data found.

## 2018-12-25 LAB — GLUCOSE, CAPILLARY: Glucose-Capillary: 171 mg/dL — ABNORMAL HIGH (ref 70–99)

## 2018-12-25 MED ORDER — TRESIBA FLEXTOUCH 200 UNIT/ML ~~LOC~~ SOPN: 25.0000 [IU] | PEN_INJECTOR | Freq: Two times a day (BID) | SUBCUTANEOUS | 0 refills | Status: AC

## 2018-12-25 MED ORDER — AMLODIPINE BESYLATE 5 MG PO TABS: 5.0000 mg | ORAL_TABLET | Freq: Every day | ORAL | 2 refills | Status: AC

## 2018-12-25 MED ORDER — PRO-STAT SUGAR FREE PO LIQD: 30.0000 mL | Freq: Two times a day (BID) | ORAL | 0 refills | Status: AC

## 2018-12-25 MED ORDER — BISACODYL 10 MG RE SUPP: 10.0000 mg | Freq: Every day | RECTAL | 0 refills | Status: AC | PRN

## 2018-12-25 MED ORDER — ZINC SULFATE 220 (50 ZN) MG PO CAPS: 220.0000 mg | ORAL_CAPSULE | Freq: Every day | ORAL | 3 refills | Status: AC

## 2018-12-25 MED ORDER — OXYCODONE HCL 5 MG PO TABS: 5.0000 mg | ORAL_TABLET | ORAL | 0 refills | Status: AC | PRN

## 2018-12-25 MED ORDER — POLYETHYLENE GLYCOL 3350 17 G PO PACK: 17.0000 g | PACK | Freq: Every day | ORAL | 0 refills | Status: AC | PRN

## 2018-12-25 MED ORDER — ADULT MULTIVITAMIN W/MINERALS CH: 1.0000 | ORAL_TABLET | Freq: Every day | ORAL | 3 refills | Status: AC

## 2018-12-25 MED ORDER — DOCUSATE SODIUM 100 MG PO CAPS: 100.0000 mg | ORAL_CAPSULE | Freq: Two times a day (BID) | ORAL | 0 refills | Status: AC

## 2018-12-25 MED ORDER — METOPROLOL TARTRATE 25 MG PO TABS: 25.0000 mg | ORAL_TABLET | Freq: Two times a day (BID) | ORAL | 2 refills | Status: AC

## 2018-12-25 NOTE — Progress Notes (Signed)
Yolanda [name] to be D/C'd home per MD order.  Discussed with the patient and all questions fully answered.  VSS, Skin clean, dry and intact without evidence of skin break down, no evidence of skin tears noted. IV catheter discontinued intact. Site without signs and symptoms of complications. Dressing and pressure applied.  An After Visit Summary was printed and given to the patient. Patient received prescription.  D/c education completed with patient/family including follow up instructions, medication list, d/c activities limitations if indicated, with other d/c instructions as indicated by MD - patient able to verbalize understanding, all questions fully answered.   Patient instructed to return to ED, call 911, or call MD for any changes in condition.   Patient escorted via [place], and D/C home via private auto.  Pt educated on dressing change.   Lorenza Evangelist [facility] 12/25/2018 2:51 PM

## 2018-12-25 NOTE — TOC Transition Note (Addendum)
Transition of Care [facility]) - CM/SW Discharge Note   Patient Details  Name: Yolanda [name] MRN: [id number] Date of Birth: August 07, 1975  Transition of Care [facility]) CM/SW Contact:  Benard Halsted, LCSW Phone Number: 12/25/2018, 12:54 PM   Clinical Narrative:    Patient continuing to refuse SNF and requests to return  Home with [place]. CSW set up resumption of home health orders with [facility]. DME to be delivered to room. Family here to transport patient by car.   Final next level of care: [place] Barriers to Discharge: No Barriers Identified   Patient Goals and CMS Choice Patient states their goals for this hospitalization and ongoing recovery are:: Return home CMS Medicare.gov Compare Post Acute Care list provided to:: Patient Choice offered to / list presented to : Patient  Discharge Placement                Patient to be transferred to facility by: Car Name of family member notified: Mother Patient and family notified of of transfer: 12/25/18  Discharge Plan and Services In-house Referral: Clinical Social Work Discharge Planning Services: NA Post Acute Care Choice: [place]          DME Arranged: 3-N-1, Walker rolling, Wheelchair manual DME Agency: [employer] Date DME Agency Contacted: 12/25/18 Time DME Agency Contacted: 63 Representative spoke with at DME Agency: [place]: RN, PT, OT, Nurse's Aide [place] Agency: Other - See comment([place]) Date [place]: 12/25/18 Time [place]: 1254    Social Determinants of Health ([place]) Interventions     Readmission Risk Interventions No flowsheet data found.

## 2018-12-25 NOTE — Progress Notes (Signed)
Occupational Therapy Treatment Patient Details Name: Yolanda [name] MRN: [id number] DOB: November 08, 1975 Today's Date: 12/25/2018    History of present illness Pt is a 43 y.o. F with significant PMH of diabetes type 2, diabetic foot ulcer, who presents with sepsis with right foot infection, osteomyelitis and MRSA bacteremia and presumed endocarditis. 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 Pt now with L hand swelling and pain.     OT comments  Pt progressing toward established goals. Upon arrival pt sitting on BSC. Pt required minguard for toilet transfer and functional mobility at RW level and minguard to complete hand washing at sink level. Pt continues to require vc for NWB through L wrist during ADL and mobility. Pt currently requires S to don/doff residual limb guard and verbalized what to look for during skin checks. Pt will continue to benefit from skilled OT services to maximize safety and independence with ADL/IADL and functional mobility. Will continue to follow acutely.    Follow Up Recommendations  SNF;Supervision/Assistance - 24 hour    Equipment Recommendations  3 in 1 bedside commode    Recommendations for Other Services      Precautions / Restrictions Precautions Precautions: Fall Precaution Comments: R BKA, NWB through L wrist (reviewed positioning for R residual limb) Required Braces or Orthoses: Other Brace Other Brace: R residual limb guard Restrictions Weight Bearing Restrictions: Yes RLE Weight Bearing: Non weight bearing Other Position/Activity Restrictions: NWB L wrist        Mobility Bed Mobility Overal bed mobility: Modified Independent             General bed mobility comments: HOB elevated  Transfers Overall transfer level: Needs assistance Equipment used: Left platform walker Transfers: Sit to/from Stand Sit to Stand: Min guard        Lateral/Scoot Transfers: Min assist General transfer comment: cues for safety and maintaining weight bearing  precautions;minA to lateral scoot to reposition higher in bed, minA to scoot and vc for NWB status    Balance Overall balance assessment: Needs assistance Sitting-balance support: Feet unsupported;No upper extremity supported Sitting balance-Leahy Scale: Good     Standing balance support: Single extremity supported;During functional activity Standing balance-Leahy Scale: Poor Standing balance comment: reliant on support from LUE through elbow while standing at sink level                           ADL either performed or assessed with clinical judgement   ADL Overall ADL's : Needs assistance/impaired Eating/Feeding: Sitting;Set up   Grooming: Wash/dry hands;Min guard;Standing               Lower Body Dressing: Supervision/safety;Min guard;Sit to/from stand Lower Body Dressing Details (indicate cue type and reason): supervision to don/doff residual limb guard and stocking to perform skin checks;pt verbalized what to look for when perfoming skin checks Toilet Transfer: Min guard;Ambulation;RW Toilet Transfer Details (indicate cue type and reason): Pt sitting on BSC upon arriva; Toileting- Clothing Manipulation and Hygiene: Minimal assistance;Sit to/from stand Toileting - Clothing Manipulation Details (indicate cue type and reason): minA with pericare [facility]     Functional mobility during ADLs: Cueing for safety;Cueing for sequencing;Minimal assistance General ADL Comments: continues to require vc for NWB through L wrist during mobility and ADL completion;     Vision   Vision Assessment?: No apparent visual deficits   Perception     Praxis      Cognition Arousal/Alertness: Awake/alert Behavior During Therapy: Flat  affect Overall Cognitive Status: Impaired/Different from baseline Area of Impairment: Problem solving;Safety/judgement;Awareness;Following commands;Memory                     Memory: Decreased recall of  precautions Following Commands: Follows one step commands consistently Safety/Judgement: Decreased awareness of safety;Decreased awareness of deficits Awareness: Emergent Problem Solving: Requires verbal cues;Difficulty sequencing General Comments: pt continues to require vc for NWB through L wrist        Exercises     Shoulder Instructions       General Comments VSS throughout;educated pt on what to expect with HHOT if she continues to d/c home;pt verbalized she does not want to go SNF    Pertinent Vitals/ Pain       Pain Assessment: No/denies pain  Home Living                                          Prior Functioning/Environment              Frequency  Min 2X/week        Progress Toward Goals  OT Goals(current goals can now be found in the care plan section)  Progress towards OT goals: Progressing toward goals  Acute Rehab OT Goals Patient Stated Goal: to go home OT Goal Formulation: With patient Time For Goal Achievement: 01/03/19 Potential to Achieve Goals: Good ADL Goals Pt Will Perform Lower Body Bathing: with min assist;sitting/lateral leans;with adaptive equipment Pt Will Perform Lower Body Dressing: with min assist;sitting/lateral leans;sit to/from stand;with adaptive equipment Pt Will Transfer to Toilet: with supervision;ambulating Pt Will Perform Toileting - Clothing Manipulation and hygiene: with supervision;sitting/lateral leans Pt/caregiver will Perform Home Exercise Program: Increased strength;Increased ROM;Both right and left upper extremity;With written HEP provided  Plan Discharge plan remains appropriate;Frequency remains appropriate    Co-evaluation                 AM-PAC OT "6 Clicks" Daily Activity     Outcome Measure   Help from another person eating meals?: None Help from another person taking care of personal grooming?: A Little Help from another person toileting, which includes using toliet, bedpan, or  urinal?: A Little Help from another person bathing (including washing, rinsing, drying)?: A Little Help from another person to put on and taking off regular upper body clothing?: A Little Help from another person to put on and taking off regular lower body clothing?: A Little 6 Click Score: 19    End of Session Equipment Utilized During Treatment: Gait belt;Rolling walker(residual limb guard)  OT Visit Diagnosis: Unsteadiness on feet (R26.81);Muscle weakness (generalized) (M62.81)   Activity Tolerance Patient tolerated treatment well   Patient Left in bed;with call bell/phone within reach   Nurse Communication Mobility status        Time: 1001-1015 OT Time Calculation (min): 14 min  Charges: OT General Charges $OT Visit: 1 Visit OT Treatments $Self Care/Home Management : 8-22 mins  Dorinda Hill OTR/L Acute Rehabilitation Services Office: [place] 12/25/2018, 10:24 AM

## 2018-12-25 NOTE — Progress Notes (Cosign Needed)
    Durable Medical Equipment  (From admission, onward)         Start     Ordered   12/25/18 1158  For home use only DME 3 n 1  Once     12/25/18 1159   12/25/18 1157  For home use only DME Walker rolling  Once    Comments: L platform rolling walker  Question:  Patient needs a walker to treat with the following condition  Answer:  Weakness   12/25/18 1159   12/25/18 1153  For home use only DME 3 n 1  Once     12/25/18 1159   12/25/18 1153  For home use only DME lightweight manual wheelchair with seat cushion  Once    Comments: Patient suffers from R BKA which impairs their ability to perform daily activities like walking in the home.  A  Rolling walker will not resolve  issue with performing activities of daily living. A wheelchair will allow patient to safely perform daily activities. Patient is not able to propel themselves in the home using a standard weight wheelchair due to weakness. Patient can self propel in the lightweight wheelchair. Length of need forver. Accessories: elevating leg rests (ELRs), wheel locks, extensions and anti-tippers. Wheelchair with seat and back cushion,(swing away amputee attachment for w/c.   12/25/18 1159   12/25/18 0835  For home use only DME Walker rolling  Once    Question:  Patient needs a walker to treat with the following condition  Answer:  Hx of BKA, right ([place])   12/25/18 3382

## 2018-12-25 NOTE — Discharge Summary (Signed)
Physician Discharge Summary  Yolanda Noseiffany Pettway ZOX:[id number] DOB: 1975/12/14 DOA: 11/28/2018  PCP: System, Pcp Not In  Admit date: 11/28/2018 Discharge date: 12/25/2018  Admitted From: home  Disposition:  home   Recommendations for Outpatient Follow-up:  1. Out patient f/u of Hb in 1 wk  Home Health:  ordered Equipment/Devices: rolling walker    Discharge Condition:  stable   CODE STATUS:  Full code   Diet recommendation:  Diabetic, heart healthy Consultations:  Ortho  ID    Discharge Diagnoses:  Principal Problem:   MRSA bacteremia Active Problems:   Subacute osteomyelitis, right ankle and foot (HCC)   Abscess of bursa, left wrist   Abscess of left hand   Diabetes mellitus type 2, uncontrolled, with complications (HCC)   Charcot foot due to diabetes mellitus (HCC)   Severe protein-calorie malnutrition (HCC)   Diabetic polyneuropathy associated with type 2 diabetes mellitus (HCC)   Cigarette smoker      Brief Summary: Yolanda Noseiffany [name] 43 year old female with diabetes mellitus type 2, severe protein calorie malnutrition, diabetic foot ulcer, hospitalization from 10/07/2018-10/15/2018 for MRSA bacteremia with presumed endocarditis with failed daptomycin treatment and subsequently treated with vancomycin with completion on 11/20/2018. She presented on 11/28/2018 with worsening pain and swelling of her right foot. CT scan showed destruction of the entire mid and hindfoot with fluid and gas collections with surrounding cellulitis. She was [facility] She was started on intravenous antibiotics. ID and orthopedics were consulted. 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 ID recommends IV vancomycin till 12/25/2018. She is waiting for SNF placement.  Hospital Course:  Principal Problem:   MRSA bacteremia Diabetic right foot infection/osteomyelitis - 5/20 > blood cultures MRSA - 5/22> right transtibial amputation - 5/22 > TEE- no  endocarditis - ID recommend 4 wks of IV Vancomycin which has been completed today - remove RUE PICC line afterwards - Dr Lajoyce Cornersuda recommends she f/u in his office towards the end of the week to have a f/u and staples removed  Left wrist/ forearm abscess - I and D 12/07/18- culture shows MRSA - last ortho eval was on 6/5- plan daily dressing changes, f/u in office for suture and staple removal - non weight bearing on left wrist     Diabetes mellitus type 2, uncontrolled - A1c 7.9- cont Lantus and Novolog- she has been needing much lower doses than what she was listed to be using at home    Severe protein-calorie malnutrition -cont supplements   Essential HTN - cont Amlodipine  Acute on chronic anemia- ? Acute blood loss - s/p 1 u PRBC for Hb of 7.4 at outside hospital - Hb now 8-9 range  Discharge Exam: Vitals:   12/25/18 0557 12/25/18 0919  BP: 133/80 (!) 161/98  Pulse: 97   Resp: 17   Temp: 98.4 F (36.9 C)   SpO2: 100%    Vitals:   12/24/18 1334 12/24/18 2148 12/25/18 0557 12/25/18 0919  BP: 135/84 134/78 133/80 (!) 161/98  Pulse: 99 (!) 101 97   Resp: 18 18 17    Temp: 98.2 F (36.8 C) 98.6 F (37 C) 98.4 F (36.9 C)   TempSrc: Oral Oral Oral   SpO2: 100% 100% 100%   Weight:   67.4 kg   Height:        General: Pt is alert, awake, not in acute distress Cardiovascular: RRR, S1/S2 +, no rubs, no gallops Respiratory: CTA bilaterally, no wheezing, no rhonchi Abdominal: Soft, NT, ND, bowel sounds +  Extremities: no edema, no cyanosis- left wrist incision and right stump incisions are clean   Discharge Instructions  Discharge Instructions    Diet - low sodium heart healthy   Complete by: As directed    Increase activity slowly   Complete by: As directed      Allergies as of 12/25/2018   No Known Allergies     Medication List    STOP taking these medications   ciprofloxacin 500 MG tablet Commonly known as: CIPRO   HumaLOG KwikPen 100 UNIT/ML  KwikPen Generic drug: insulin lispro   hydrOXYzine 25 MG tablet Commonly known as: ATARAX/VISTARIL   Potassium Chloride ER 20 MEQ Tbcr   rivaroxaban 20 MG Tabs tablet Commonly known as: XARELTO     TAKE these medications   acetaminophen 325 MG tablet Commonly known as: TYLENOL Take 2 tablets (650 mg total) by mouth every 6 (six) hours as needed for mild pain, fever or headache (or Fever >/= 101).   amLODipine 5 MG tablet Commonly known as: NORVASC Take 1 tablet (5 mg total) by mouth daily.   bisacodyl 10 MG suppository Commonly known as: DULCOLAX Place 1 suppository (10 mg total) rectally daily as needed for moderate constipation.   docusate sodium 100 MG capsule Commonly known as: COLACE Take 1 capsule (100 mg total) by mouth 2 (two) times daily.   feeding supplement (PRO-STAT SUGAR FREE 64) Liqd Take 30 mLs by mouth 2 (two) times daily.   insulin aspart 100 UNIT/ML injection Commonly known as: novoLOG Inject 3 Units into the skin 3 (three) times daily with meals.   loperamide 2 MG capsule Commonly known as: IMODIUM Take 1 capsule (2 mg total) by mouth every 6 (six) hours as needed for diarrhea or loose stools.   metoprolol tartrate 25 MG tablet Commonly known as: LOPRESSOR Take 1 tablet (25 mg total) by mouth 2 (two) times daily.   multivitamin with minerals Tabs tablet Take 1 tablet by mouth daily.   oxyCODONE 5 MG immediate release tablet Commonly known as: Oxy IR/ROXICODONE Take 1 tablet (5 mg total) by mouth every 4 (four) hours as needed for moderate pain (pain score 4-6).   polyethylene glycol 17 g packet Commonly known as: MIRALAX / GLYCOLAX Take 17 g by mouth daily as needed for mild constipation.   Evaristo Buryresiba FlexTouch 200 UNIT/ML Sopn Generic drug: Insulin Degludec Inject 26 Units into the skin 2 (two) times a day. What changed: how much to take   vitamin C 500 MG tablet Commonly known as: ASCORBIC ACID Take 1 tablet (500 mg total) by mouth  daily.   zinc sulfate 220 (50 Zn) MG capsule Take 1 capsule (220 mg total) by mouth daily.            Durable Medical Equipment  (From admission, onward)         Start     Ordered   12/25/18 1158  For home use only DME 3 n 1  Once     12/25/18 1159   12/25/18 1157  For home use only DME Walker rolling  Once    Comments: L platform rolling walker  Question:  Patient needs a walker to treat with the following condition  Answer:  Weakness   12/25/18 1159   12/25/18 1153  For home use only DME 3 n 1  Once     12/25/18 1159   12/25/18 1153  For home use only DME lightweight manual wheelchair with seat cushion  Once  Comments: Patient suffers from R BKA which impairs their ability to perform daily activities like walking in the home.  A  Rolling walker will not resolve  issue with performing activities of daily living. A wheelchair will allow patient to safely perform daily activities. Patient is not able to propel themselves in the home using a standard weight wheelchair due to weakness. Patient can self propel in the lightweight wheelchair. Length of need forver. Accessories: elevating leg rests (ELRs), wheel locks, extensions and anti-tippers. Wheelchair with seat and back cushion,(swing away amputee attachment for w/c.   12/25/18 1159   12/25/18 0835  For home use only DME Walker rolling  Once    Question:  Patient needs a walker to treat with the following condition  Answer:  Hx of BKA, right (HCC)   12/25/18 0834         Follow-up Information    Nadara Mustard, MD Follow up in 2 day(s).   Specialty: Orthopedic Surgery Contact information: [address] [place] [address] [phone]        [facility] Follow up.   Why: Home Health Services Resumed (PT/OT/RN/Aide) Contact information: [phone]       [facility] Follow up.   Why: Wheelchari, Walker, Bedside Commode to be delivered to room prior to discharge.          No Known Allergies   Procedures/Studies:    Dg Wrist 2 Views Left  Result Date: 12/04/2018 CLINICAL DATA:  43 year old female with history of swelling in the left wrist. EXAM: LEFT WRIST - 2 VIEW COMPARISON:  No priors. FINDINGS: There is no evidence of fracture or dislocation. There is no evidence of arthropathy or other focal bone abnormality. Diffuse soft tissue swelling around the wrist. IMPRESSION: 1. Severe soft tissue swelling around the left wrist with no underlying displaced fracture, subluxation or dislocation. Electronically Signed   By: Trudie Reed M.D.   On: 12/04/2018 18:09   Mr Wrist Left Wo Contrast  Result Date: 12/05/2018 CLINICAL DATA:  Chronic left wrist swelling. Patient's radiographs shows no destructive bony changes, subluxation, or fractures. White cell count 22,000 uric acid within normal range. I EXAM: MR OF THE LEFT WRIST WITHOUT CONTRAST TECHNIQUE: Multiplanar, multisequence MR imaging of the left wrist was performed. No intravenous contrast was administered. COMPARISON:  None. FINDINGS: Ligaments: Intact scapholunate and lunotriquetral ligaments. Triangular fibrocartilage: Intact TFCC. Tendons: Intact flexor and extensor compartment tendons. Carpal tunnel/median nerve: Normal carpal tunnel. Normal median nerve. Guyon's canal: Normal. Joint/cartilage: No joint effusion. No synovitis. No intra-articular loose body. Small amount of joint fluid in the pisotriquetral joint. No chondral defect. Bones/carpal alignment: No acute osseous abnormality. No aggressive osseous lesion. No periosteal reaction or bone destruction. Normal alignment. Other: Soft tissue edema circumferentially around the wrist most concerning for cellulitis. Heterogeneous T2 hyperintense and T1 hypointense area in the volar subcutaneous fat measuring approximately 2.6 x 1.3 x 3 cm which may reflect phlegmonous changes versus a developing abscess. Muscle edema in the opponens pollicis muscle, adductor  digiti minimi, and flexor digiti minimi muscles concerning for myositis secondary to an infectious or inflammatory etiology. IMPRESSION: 1. Cellulitis of left wrist. 2. Heterogeneous focal abnormality in the volar subcutaneous fat measuring 2.6 x 1.3 x 3 cm which may reflect a phlegmon versus developing abscess. If there is further clinical concern recommend MRI of the left wrist with contrast. 3. Muscle edema in the opponens pollicis muscle, adductor digiti minimi, and flexor digiti minimi muscles concerning for myositis  secondary to an infectious or inflammatory etiology. 4. No evidence of septic arthritis of the left wrist. Electronically Signed   By: Kathreen Devoid   On: 12/05/2018 21:39   Vas [place] Abi With/wo Tbi  Result Date: 11/29/2018 LOWER EXTREMITY DOPPLER STUDY Indications: Diabetic foot infection. High Risk Factors: Diabetes.  Performing Technologist: Abram Sander RVS  Examination Guidelines: A complete evaluation includes at minimum, Doppler waveform signals and systolic blood pressure reading at the level of bilateral brachial, anterior tibial, and posterior tibial arteries, when vessel segments are accessible. Bilateral testing is considered an integral part of a complete examination. Photoelectric Plethysmograph (PPG) waveforms and toe systolic pressure readings are included as required and additional duplex testing as needed. Limited examinations for reoccurring indications may be performed as noted.  ABI Findings: +--------+------------------+-----+---------+--------+ Right   Rt Pressure (mmHg)IndexWaveform Comment  +--------+------------------+-----+---------+--------+ [id number]                    triphasic         +--------+------------------+-----+---------+--------+ PTA     151               1.14 biphasic          +--------+------------------+-----+---------+--------+ DP      160               1.20 biphasic           +--------+------------------+-----+---------+--------+ +--------+------------------+-----+---------+-------+ Left    Lt Pressure (mmHg)IndexWaveform Comment +--------+------------------+-----+---------+-------+ [id number]                    triphasic        +--------+------------------+-----+---------+-------+ PTA     147               1.11 triphasic        +--------+------------------+-----+---------+-------+ DP      148               1.11 triphasic        +--------+------------------+-----+---------+-------+ +-------+-----------+-----------+------------+------------+ ABI/TBIToday's ABIToday's TBIPrevious ABIPrevious TBI +-------+-----------+-----------+------------+------------+ Right  1.20                                           +-------+-----------+-----------+------------+------------+ Left   1.11                                           +-------+-----------+-----------+------------+------------+  Summary: Right: Resting right ankle-brachial index is within normal range. No evidence of significant right lower extremity arterial disease. Left: Resting left ankle-brachial index is within normal range. No evidence of significant left lower extremity arterial disease.  *See table(s) above for measurements and observations.  Electronically signed by Monica Martinez MD on 11/29/2018 at 5:28:23 PM.   Final    [place] Ekg Site Rite  Result Date: 12/03/2018 If [facility] image not attached, placement could not be confirmed due to current cardiac rhythm.    The results of significant diagnostics from this hospitalization (including imaging, microbiology, ancillary and laboratory) are listed below for reference.     Microbiology: No results found for this or any previous visit (from the past 240 hour(s)).   Labs: BNP (last 3 results) No results for input(s): BNP in the last 8760 hours. Basic Metabolic Panel: Recent Labs  Lab 12/19/18 0432 12/22/18 0351  NA 136  136  K 4.0 3.6  CL 101 103  CO2 28 24  GLUCOSE 165* 167*  BUN 18 13  CREATININE 0.59 0.50  CALCIUM 9.6 9.4  MG 1.8  --    Liver Function Tests: No results for input(s): AST, ALT, ALKPHOS, BILITOT, PROT, ALBUMIN in the last 168 hours. No results for input(s): LIPASE, AMYLASE in the last 168 hours. No results for input(s): AMMONIA in the last 168 hours. CBC: Recent Labs  Lab 12/19/18 0432  WBC 6.8  NEUTROABS 3.6  HGB 9.2*  HCT 30.0*  MCV 101.0*  PLT 310   Cardiac Enzymes: No results for input(s): CKTOTAL, CKMB, CKMBINDEX, TROPONINI in the last 168 hours. BNP: Invalid input(s): POCBNP CBG: Recent Labs  Lab 12/24/18 0758 12/24/18 1222 12/24/18 1705 12/24/18 2149 12/25/18 0756  GLUCAP 170* 254* 160* 194* 171*   D-Dimer No results for input(s): DDIMER in the last 72 hours. Hgb A1c No results for input(s): HGBA1C in the last 72 hours. Lipid Profile No results for input(s): CHOL, HDL, LDLCALC, TRIG, CHOLHDL, LDLDIRECT in the last 72 hours. Thyroid function studies No results for input(s): TSH, T4TOTAL, T3FREE, THYROIDAB in the last 72 hours.  Invalid input(s): FREET3 Anemia work up No results for input(s): VITAMINB12, FOLATE, FERRITIN, TIBC, IRON, RETICCTPCT in the last 72 hours. Urinalysis    Component Value Date/Time   COLORURINE YELLOW 11/29/2018 0653   APPEARANCEUR CLEAR 11/29/2018 0653   LABSPEC 1.010 11/29/2018 0653   PHURINE 5.5 11/29/2018 0653   GLUCOSEU NEGATIVE 11/29/2018 0653   HGBUR TRACE (A) 11/29/2018 0653   BILIRUBINUR SMALL (A) 11/29/2018 0653   KETONESUR NEGATIVE 11/29/2018 0653   PROTEINUR NEGATIVE 11/29/2018 0653   NITRITE NEGATIVE 11/29/2018 0653   LEUKOCYTESUR NEGATIVE 11/29/2018 0653   Sepsis Labs Invalid input(s): PROCALCITONIN,  WBC,  LACTICIDVEN Microbiology No results found for this or any previous visit (from the past 240 hour(s)).   Time coordinating discharge in minutes: 65  SIGNED:   Calvert CantorSaima Damian Hofstra, MD  [facility] 12/25/2018, 1:32 PM Pager   If 7PM-7AM, please contact night-coverage [email or web address] Password [email or web address]

## 2018-12-25 NOTE — Progress Notes (Signed)
Physical Therapy Treatment Patient Details Name: Yolanda [name] MRN: [id number] DOB: September 22, 1975 Today's Date: 12/25/2018    History of Present Illness Pt is a 43 y.o. F with significant PMH of diabetes type 2, diabetic foot ulcer, who presents with sepsis with right foot infection, osteomyelitis and MRSA bacteremia and presumed endocarditis. 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 Pt now with L hand swelling and pain.      PT Comments    Patient seen for mobility progression. Pt is making progress toward PT goals and tolerated session well. Pt continues to demonstrate decreased awareness of safety/deficits and requires cues to maintain L wrist NWB status. Pt will continue to benefit from further skilled PT services.     Follow Up Recommendations  SNF;Supervision/Assistance - 24 hour     Equipment Recommendations  Wheelchair (measurements PT);Wheelchair cushion (measurements PT);Other (comment)(swing away amputee attachment for w/c; L platform RW )    Recommendations for Other Services       Precautions / Restrictions Precautions Precautions: Fall Precaution Comments: R BKA, NWB through L wrist (reviewed positioning for R residual limb) Required Braces or Orthoses: Other Brace Other Brace: R residual limb guard Restrictions Weight Bearing Restrictions: Yes RLE Weight Bearing: Non weight bearing Other Position/Activity Restrictions: NWB L wrist     Mobility  Bed Mobility Overal bed mobility: Modified Independent             General bed mobility comments: HOB elevated  Transfers Overall transfer level: Needs assistance Equipment used: Left platform walker Transfers: Sit to/from Stand Sit to Stand: Min assist         General transfer comment: cues for safety and to maintain weight bearing precautions  Ambulation/Gait Ambulation/Gait assistance: Min guard;Min assist Gait Distance (Feet): (50 X 2 trials with rest breaks) Assistive device: Left platform walker Gait  Pattern/deviations: Step-to pattern Gait velocity: decreased   General Gait Details: cues for sequencing and maintaining safe proximity to RW; assist at times to steady   Stairs             Wheelchair Mobility    Modified Rankin (Stroke Patients Only)       Balance Overall balance assessment: Needs assistance Sitting-balance support: Feet unsupported;No upper extremity supported Sitting balance-Leahy Scale: Good     Standing balance support: During functional activity;Bilateral upper extremity supported Standing balance-Leahy Scale: Poor                              Cognition Arousal/Alertness: Awake/alert Behavior During Therapy: Flat affect Overall Cognitive Status: Impaired/Different from baseline Area of Impairment: Problem solving;Safety/judgement;Awareness;Following commands;Memory                     Memory: Decreased recall of precautions Following Commands: Follows one step commands consistently Safety/Judgement: Decreased awareness of safety;Decreased awareness of deficits Awareness: Emergent Problem Solving: Requires verbal cues;Difficulty sequencing        Exercises      General Comments        Pertinent Vitals/Pain Pain Assessment: No/denies pain    Home Living                      Prior Function            PT Goals (current goals can now be found in the care plan section) Progress towards PT goals: Progressing toward goals    Frequency    Min 3X/week  PT Plan Current plan remains appropriate    Co-evaluation              AM-PAC PT "6 Clicks" Mobility   Outcome Measure  Help needed turning from your back to your side while in a flat bed without using bedrails?: None Help needed moving from lying on your back to sitting on the side of a flat bed without using bedrails?: None Help needed moving to and from a bed to a chair (including a wheelchair)?: A Little Help needed standing up  from a chair using your arms (e.g., wheelchair or bedside chair)?: A Little Help needed to walk in hospital room?: A Little Help needed climbing 3-5 steps with a railing? : Total 6 Click Score: 18    End of Session Equipment Utilized During Treatment: Gait belt Activity Tolerance: Patient tolerated treatment well Patient left: with call bell/phone within reach;in bed;with nursing/sitter in room;Other (comment)(pt sitting EOB and RN taking vitals ) Nurse Communication: Mobility status PT Visit Diagnosis: Other abnormalities of gait and mobility (R26.89);Pain Pain - Right/Left: Left Pain - part of body: Hand     Time: [id number] PT Time Calculation (min) (ACUTE ONLY): 28 min  Charges:  $Gait Training: 23-37 mins                     Erline LevineKellyn Sowmya Partridge, PTA Acute Rehabilitation Services Pager: [phone] Office: [phone]     Carolynne EdouardKellyn R Shaheem Pichon 12/25/2018, 9:27 AM

## 2018-12-25 NOTE — Discharge Instructions (Signed)
Please see your PCP in 1 wk and take your discharge papers and medications with you.  You were cared for by a hospitalist during your hospital stay. If you have any questions about your discharge medications or the care you received while you were in the hospital after you are discharged, you can call the unit and asked to speak with the hospitalist on call if the hospitalist that took care of you is not available. Once you are discharged, your primary care physician will handle any further medical issues.   Please note that NO REFILLS for any discharge medications will be authorized once you are discharged, as it is imperative that you return to your primary care physician (or establish a relationship with a primary care physician if you do not have one) for your aftercare needs so that they can reassess your need for medications and monitor your lab values.  Please take all your medications with you for your next visit with your Primary MD. Please ask your Primary MD to get all Hospital records sent to his/her office. Please request your Primary MD to go over all hospital test results at the follow up.   If you experience worsening of your admission symptoms, develop shortness of breath, chest pain, suicidal or homicidal thoughts or a life threatening emergency, you must seek medical attention immediately by calling 911 or calling your MD.   Dennis Bast must read the complete instructions/literature along with all the possible adverse reactions/side effects for all the medicines you take including new medications that have been prescribed to you. Take new medicines after you have completely understood and accpet all the possible adverse reactions/side effects.    Do not drive when taking pain medications or sedatives.     Do not take more than prescribed Pain, Sleep and Anxiety Medications   If you have smoked or chewed Tobacco in the last 2 yrs please stop. Stop any regular alcohol  and or recreational  drug use.   Wear Seat belts while driving.

## 2018-12-25 NOTE — Progress Notes (Signed)
Nutrition Follow-up  DOCUMENTATION CODES:   Not applicable  INTERVENTION:   -Continue MVI with minerals daily -Continue Ensure Max po daily, each supplement provides 150 kcal and 30 grams of protein.   NUTRITION DIAGNOSIS:   Increased nutrient needs related to wound healing, post-op healing as evidenced by estimated needs.  Ongoing  GOAL:   Patient will meet greater than or equal to 90% of their needs  Progressing  MONITOR:   PO intake, Supplement acceptance, Labs, Weight trends, Skin, I & O's  REASON FOR ASSESSMENT:   Malnutrition Screening Tool, Consult Wound healing  ASSESSMENT:   Lauralei Clouse is a 43 y.o. female with medical history significant of T2DM, diabetic foot ulcer, osteomyelitis and cellulitis with MRSA bacteremia, presumed endocarditis presenting with worsening right lower extremity pain and swelling.  5/22- s/pPROCEDURE: Transtibial amputation Application of Prevena wound VAC 5/26- PICC placed, s/p TEE- no endocarditis 5/28- MRI of lt wrist reveals fluid collection in the volar aspect radial lt wrist 5/29- s/pPROCEDURE: IRRIGATION AND DEBRIDEMENT LEFT WRIST;Irrigation and debridement left hand.  Reviewed I/O's: +1.1 L x 24 hours and +2.2 L since 12/11/18  Pt remains with good appetite; noted 100% meal completion. Pt is also accepting on MVI and Ensure Max supplements.  Per MD notes, pt medically stable for discharge, awaiting SNF placement. CSW notes reveal investigating SNF placement on LOG.   Labs reviewed: CBGS: 160-254 (inpatient orders for glycemic control 0-15 units insulin aspart TID with meals, 0-5 units insulin aspart q HS, 5 units insulin aspart TID with meals, and 24 units insulin glargine BID).   Diet Order:   Diet Order            Diet - low sodium heart healthy        Diet Carb Modified Fluid consistency: Thin; Room service appropriate? Yes with Assist  Diet effective now              EDUCATION NEEDS:   No education  needs have been identified at this time  Skin:  Skin Assessment: Skin Integrity Issues: Skin Integrity Issues:: Incisions Wound Vac: rt BKA site Incisions: s/p rt BKA Other: closed lt arm  Last BM:  12/25/18  Height:   Ht Readings from Last 1 Encounters:  11/28/18 5\' 6"  (1.676 m)    Weight:   Wt Readings from Last 1 Encounters:  12/25/18 67.4 kg    Ideal Body Weight:  55.3 kg(adjusted for rt BKA)  BMI:  Body mass index is 23.98 kg/m.  Estimated Nutritional Needs:   Kcal:  1700-1900  Protein:  95-110 grams  Fluid:  > 1.7 L    Finnian Husted A. Jimmye Norman, RD, LDN, Thermal Registered Dietitian II Certified Diabetes Care and Education Specialist Pager: [phone] After hours Pager: [phone]

## 2018-12-27 ENCOUNTER — Encounter: Payer: Self-pay | Admitting: Orthopedic Surgery

## 2018-12-27 ENCOUNTER — Other Ambulatory Visit: Payer: Self-pay

## 2018-12-27 ENCOUNTER — Ambulatory Visit (INDEPENDENT_AMBULATORY_CARE_PROVIDER_SITE_OTHER): Payer: 59 | Admitting: Physician Assistant

## 2018-12-27 VITALS — Ht 66.0 in | Wt 148.6 lb

## 2018-12-27 DIAGNOSIS — R7881 Bacteremia: Secondary | ICD-10-CM

## 2018-12-27 DIAGNOSIS — L02419 Cutaneous abscess of limb, unspecified: Secondary | ICD-10-CM

## 2018-12-27 DIAGNOSIS — E1142 Type 2 diabetes mellitus with diabetic polyneuropathy: Secondary | ICD-10-CM

## 2018-12-27 DIAGNOSIS — Z89511 Acquired absence of right leg below knee: Secondary | ICD-10-CM

## 2018-12-27 NOTE — Progress Notes (Signed)
Office Visit Note   Patient: Yolanda [name]           Date of Birth: 29-Dec-1975           MRN: [id number] Visit Date: 12/27/2018              Requested by: No referring provider defined for this encounter. PCP: System, Pcp Not In  Chief Complaint  Patient presents with  . Left Wrist - Routine Post Op    12/07/18 left wrist  . Right Leg - Routine Post Op    BKA right leg 11/30/18      HPI: The patient is a 43 year old woman who presents for postoperative follow-up following a right transtibial amputation on 11/30/2018 and irrigation and debridement of a left wrist abscess on 12/07/2018.  She has been discharged home and is residing with her parents.  She is wearing a shrinker stocking on her right transtibial amputation site and a limb protector.  She presents in the wheelchair.  Assessment & Plan: Visit Diagnoses:  1. Acquired absence of right leg below knee ([place])   2. Abscess of wrist   3. MRSA bacteremia   4. Diabetic polyneuropathy associated with type 2 diabetes mellitus ([place])     Plan: Sutures and staples were removed from both her right transtibial amputation site and her left wrist and the patient tolerated this well.  Patient was instructed to continue to wash the areas with soap and water and to utilize cocoa butter or Shea butter for moisturization. She was given a prescription for [facility] for a K3 transtibial prosthetic-she should be a community ambulator and be able to ambulate on varying unlevel surfaces and be able to utilize a prosthetic to attend community services and events.  She should be able to change speeds and be able to vary her speeds with ambulation and be able to ambulate on varying surfaces such as grass, gravel, curbs, stairs, and bleachers. She will follow-up in about 4 weeks or sooner should she have difficulties in the interim. Follow-Up Instructions: Return in about 4 weeks (around 01/24/2019).   Ortho Exam  Patient is alert, oriented, no  adenopathy, well-dressed, normal affect, normal respiratory effort. The left wrist is healing well and the sutures were removed without difficulty.  She had a large amount of loose peeling skin and this was debrided from the area as well revealing healthy skin underneath.  There are no signs of ongoing cellulitis or infection.  She has good motion at the wrist and fingers. The right transtibial amputation site is healing extremely well.  Sutures and staples were harvested this visit.  There are no signs of infection or cellulitis.  She has full knee extension. Imaging: No results found.    Labs: Lab Results  Component Value Date   HGBA1C 7.9 (H) 11/28/2018   HGBA1C 11.0 (H) 10/07/2018   ESRSEDRATE 136 (H) 11/28/2018   CRP 22.1 (H) 11/28/2018   LABURIC 3.2 12/04/2018   REPTSTATUS 12/12/2018 FINAL 12/07/2018   GRAMSTAIN  12/07/2018    RARE WBC PRESENT, PREDOMINANTLY MONONUCLEAR NO ORGANISMS SEEN    CULT  12/07/2018    RARE METHICILLIN RESISTANT STAPHYLOCOCCUS AUREUS NO ANAEROBES ISOLATED Performed at [facility], [place] [address], [place], [place] [address]    LABORGA METHICILLIN RESISTANT STAPHYLOCOCCUS AUREUS 12/07/2018     Lab Results  Component Value Date   ALBUMIN 1.2 (L) 11/29/2018   ALBUMIN 1.4 (L) 11/28/2018   ALBUMIN 2.4 (L) 10/13/2018   LABURIC  3.2 12/04/2018    Body mass index is 23.98 kg/m.  Orders:  No orders of the defined types were placed in this encounter.  No orders of the defined types were placed in this encounter.    Procedures: No procedures performed  Clinical Data: No additional findings.  ROS:  All other systems negative, except as noted in the HPI. Review of Systems  Objective: Vital Signs: Ht 5\' 6"  (1.676 m)   Wt 148 lb 9.4 oz (67.4 kg)   LMP  (LMP Unknown)   BMI 23.98 kg/m   Specialty Comments:  No specialty comments available.  PMFS History: Patient Active Problem List   Diagnosis Date Noted  . Abscess of left hand    . Abscess of bursa, left wrist   . Subacute osteomyelitis, right ankle and foot (HCC)   . Right foot infection 11/28/2018  . S/P PICC central line placement   . Abnormal LFTs   . Cigarette smoker 10/08/2018  . Diabetes mellitus type 2, uncontrolled, with complications (HCC) 10/07/2018  . MRSA bacteremia 10/07/2018  . 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 Severe protein-calorie malnutrition (HCC)   . Leg wound, right, sequela   . Diabetic polyneuropathy associated with type 2 diabetes mellitus (HCC)    Past Medical History:  Diagnosis Date  . MRSA infection within last 3 months   . Type 2 diabetes mellitus treated with insulin (HCC)     History reviewed. No pertinent family history.  Past Surgical History:  Procedure Laterality Date  . AMPUTATION Right 11/30/2018   Procedure: RIGHT BELOW KNEE AMPUTATION;  Surgeon: Nadara Mustarduda, Marcus V, MD;  [facility];  Service: Orthopedics;  Laterality: Right;  . APPLICATION OF WOUND VAC Right 11/30/2018   Procedure: Application Of Wound Vac;  Surgeon: Nadara Mustarduda, Marcus V, MD;  [facility];  Service: Orthopedics;  Laterality: Right;  . I&D EXTREMITY Left 12/07/2018   Procedure: IRRIGATION AND DEBRIDEMENT LEFT WRIST;  Surgeon: Nadara Mustarduda, Marcus V, MD;  [facility];  Service: Orthopedics;  Laterality: Left;  . IRRIGATION AND DEBRIDEMENT KNEE Left   . TEE WITHOUT CARDIOVERSION N/A 12/04/2018   Procedure: TRANSESOPHAGEAL ECHOCARDIOGRAM (TEE);  Surgeon: Lars MassonNelson, Katarina H, MD;  [facility];  Service: Cardiovascular;  Laterality: N/A;   Social History   Occupational History  . Not on file  Tobacco Use  . Smoking status: Current Some Day Smoker    Years: 3.00    Types: Cigarettes  . Smokeless tobacco: Never Used  Substance and Sexual Activity  . Alcohol use: Not Currently  . Drug use: Never  . Sexual activity: Not on file

## 2019-01-28 ENCOUNTER — Encounter: Payer: Self-pay | Admitting: Orthopedic Surgery

## 2019-01-28 ENCOUNTER — Ambulatory Visit (INDEPENDENT_AMBULATORY_CARE_PROVIDER_SITE_OTHER): Payer: 59 | Admitting: Orthopedic Surgery

## 2019-01-28 VITALS — Ht 66.0 in | Wt 148.6 lb

## 2019-01-28 DIAGNOSIS — L02419 Cutaneous abscess of limb, unspecified: Secondary | ICD-10-CM

## 2019-01-28 DIAGNOSIS — Z89511 Acquired absence of right leg below knee: Secondary | ICD-10-CM

## 2019-01-28 MED ORDER — MUPIROCIN 2 % EX OINT: 1.0000 "application " | TOPICAL_OINTMENT | Freq: Two times a day (BID) | CUTANEOUS | 3 refills | Status: AC

## 2019-02-03 ENCOUNTER — Encounter: Payer: Self-pay | Admitting: Orthopedic Surgery

## 2019-02-03 NOTE — Progress Notes (Signed)
Office Visit Note   Patient: Yolanda [name]           Date of Birth: [birth year]           MRN: [id number] Visit Date: 01/28/2019              Requested by: No referring provider defined for this encounter. PCP: System, Pcp Not In  Chief Complaint  Patient presents with  . Left Wrist - Routine Post Op    12/07/18 I&D  . Right Leg - Routine Post Op    11/30/18 Right leg BKA      HPI: Patient is a 43 year old woman who presents for 2 separate issues.  #1 she is 2 months status post debridement of the left wrist.  #2 she is also 2 months status post right transtibial amputation she is currently ambulating with a walker she is wearing her prosthesis today denies any pain or swelling.  Assessment & Plan: Visit Diagnoses:  1. Acquired absence of right leg below knee (HCC)   2. Abscess of wrist     Plan: Patient does show some breakdown of the incision on the wrist her cultures were positive for MRSA sensitive to doxycycline and Bactrim DS will call in a  prescription for Bactroban to be applied twice a day to the granulation wound.  Follow-Up Instructions: Return in about 3 weeks (around 02/18/2019).   Ortho Exam  Patient is alert, oriented, no adenopathy, well-dressed, normal affect, normal respiratory effort. Examination patient's residual limb is well consolidated no complicating features.  Examination the wrist incision there is some breakdown of the wrist most likely secondary from pressure from using her walker.  There were no exposed tendons there is good granulation tissue that measures 5 x 10 mm.  No cellulitis no drainage.  Appears to be a traumatic wound breakdown from the walker and her severe protein caloric malnutrition with albumin of 1.2.  Imaging: No results found. No images are attached to the encounter.  Labs: Lab Results  Component Value Date   HGBA1C 7.9 (H) 11/28/2018   HGBA1C 11.0 (H) 10/07/2018   ESRSEDRATE 136 (H) 11/28/2018   CRP 22.1 (H) 11/28/2018   LABURIC 3.2 12/04/2018   REPTSTATUS 12/12/2018 FINAL 12/07/2018   GRAMSTAIN  12/07/2018    RARE WBC PRESENT, PREDOMINANTLY MONONUCLEAR NO ORGANISMS SEEN    CULT  12/07/2018    RARE METHICILLIN RESISTANT STAPHYLOCOCCUS AUREUS NO ANAEROBES ISOLATED Performed at [facility], [address] [place], [place] [address]    LABORGA METHICILLIN RESISTANT STAPHYLOCOCCUS AUREUS 12/07/2018     Lab Results  Component Value Date   ALBUMIN 1.2 (L) 11/29/2018   ALBUMIN 1.4 (L) 11/28/2018   ALBUMIN 2.4 (L) 10/13/2018   LABURIC 3.2 12/04/2018    Lab Results  Component Value Date   MG 1.8 12/19/2018   MG 1.7 12/16/2018   MG 1.7 12/14/2018   No results found for: VD25OH  No results found for: PREALBUMIN CBC EXTENDED Latest Ref Rng & Units 12/19/2018 12/16/2018 12/14/2018  WBC 4.0 - 10.5 K/uL 6.8 7.8 9.0  RBC 3.87 - 5.11 MIL/uL 2.97(L) 2.81(L) 2.74(L)  HGB 12.0 - 15.0 g/dL 1.9(J9.2(L) 8.9(L) 8.6(L)  HCT 36.0 - 46.0 % 30.0(L) 28.3(L) 27.8(L)  PLT 150 - 400 K/uL 310 294 293  NEUTROABS 1.7 - 7.7 K/uL 3.6 5.4 6.6  LYMPHSABS 0.7 - 4.0 K/uL 2.2 1.7 1.7     Body mass index is 23.98 kg/m.  Orders:  No orders of the defined  types were placed in this encounter.  Meds ordered this encounter  Medications  . mupirocin ointment (BACTROBAN) 2 %    Sig: Apply 1 application topically 2 (two) times daily. Apply to the affected area 2 times a day    Dispense:  22 g    Refill:  3     Procedures: No procedures performed  Clinical Data: No additional findings.  ROS:  All other systems negative, except as noted in the HPI. Review of Systems  Objective: Vital Signs: Ht 5\' 6"  (1.676 m)   Wt 148 lb 9.4 oz (67.4 kg)   BMI 23.98 kg/m   Specialty Comments:  No specialty comments available.  PMFS History: Patient Active Problem List   Diagnosis Date Noted  . Abscess of left hand   . Abscess of bursa, left wrist   . Subacute osteomyelitis, right ankle and foot ([place])   . Right foot  infection 11/28/2018  . S/P PICC central line placement   . Abnormal LFTs   . Cigarette smoker 10/08/2018  . Diabetes mellitus type 2, uncontrolled, with complications ([place]) 22/08/5425  . MRSA bacteremia 10/07/2018  . Charcot foot due to diabetes mellitus ([place])   . Severe protein-calorie malnutrition ([place])   . Leg wound, right, sequela   . Diabetic polyneuropathy associated with type 2 diabetes mellitus ([place])    Past Medical History:  Diagnosis Date  . MRSA infection within last 3 months   . Type 2 diabetes mellitus treated with insulin ([place])     History reviewed. No pertinent family history.  Past Surgical History:  Procedure Laterality Date  . AMPUTATION Right 11/30/2018   Procedure: RIGHT BELOW KNEE AMPUTATION;  Surgeon: Newt Minion, MD;  Location: [place];  Service: Orthopedics;  Laterality: Right;  . APPLICATION OF WOUND VAC Right 11/30/2018   Procedure: Application Of Wound Vac;  Surgeon: Newt Minion, MD;  Location: [facility];  Service: Orthopedics;  Laterality: Right;  . I&D EXTREMITY Left 12/07/2018   Procedure: IRRIGATION AND DEBRIDEMENT LEFT WRIST;  Surgeon: Newt Minion, MD;  Location: [facility];  Service: Orthopedics;  Laterality: Left;  . IRRIGATION AND DEBRIDEMENT KNEE Left   . TEE WITHOUT CARDIOVERSION N/A 12/04/2018   Procedure: TRANSESOPHAGEAL ECHOCARDIOGRAM (TEE);  Surgeon: Dorothy Spark, MD;  [facility];  Service: Cardiovascular;  Laterality: N/A;   Social History   Occupational History  . Not on file  Tobacco Use  . Smoking status: Current Some Day Smoker    Years: 3.00    Types: Cigarettes  . Smokeless tobacco: Never Used  Substance and Sexual Activity  . Alcohol use: Not Currently  . Drug use: Never  . Sexual activity: Not on file

## 2019-02-14 ENCOUNTER — Ambulatory Visit: Payer: 59 | Admitting: Orthopedic Surgery

## 2019-02-18 ENCOUNTER — Telehealth: Payer: Self-pay | Admitting: Orthopedic Surgery

## 2019-02-18 NOTE — Telephone Encounter (Signed)
Pt is s/p a I&D of wrist and lower extremity 11/2018 and wants to know if she can return to work 20 hours a week. Any restrictions for this pt? Does she need follow up first?

## 2019-02-18 NOTE — Telephone Encounter (Signed)
Patient called asked if she can work a part time job (20 hours a week) 4 hours a day. The number to contact patient is [phone]

## 2019-02-19 ENCOUNTER — Other Ambulatory Visit: Payer: Self-pay

## 2019-02-19 NOTE — Telephone Encounter (Signed)
Note written and mailed to patient as requested.

## 2019-02-19 NOTE — Telephone Encounter (Signed)
Has follow up  Do you mind sending can return for light duty, minimize weight bearing of operative wrist  I called and talked to patient

## 2019-02-26 ENCOUNTER — Ambulatory Visit: Payer: 59 | Admitting: Orthopedic Surgery

## 2019-02-27 ENCOUNTER — Ambulatory Visit (INDEPENDENT_AMBULATORY_CARE_PROVIDER_SITE_OTHER): Payer: 59 | Admitting: Family

## 2019-02-27 ENCOUNTER — Encounter: Payer: Self-pay | Admitting: Family

## 2019-02-27 VITALS — Ht 66.0 in | Wt 148.0 lb

## 2019-02-27 DIAGNOSIS — M71032 Abscess of bursa, left wrist: Secondary | ICD-10-CM

## 2019-02-27 NOTE — Progress Notes (Signed)
Post-Op Visit Note   Patient: Yolanda [name]           Date of Birth: Aug 13, 1975           MRN: [id number] Visit Date: 02/27/2019 PCP: System, Pcp Not In  Chief Complaint:  Chief Complaint  Patient presents with  . 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  She is ambulating full weightbearing in her prosthesis with a cane.  Followed with [facility] for set up.  She has already begun having to add socks of ply.  States she uses 1 in the morning 2 in the afternoon.  Overall feels well and has no concerns as for her amputation or wrist  Ortho Exam Her left wrist incision is well-healed its some surrounding callus there is no erythema no drainage no open area no redness.  The right below the knee amputation is well-healed well consolidated there is no impending breakdown no erythema no wound.  Visit Diagnoses:  1. Abscess of bursa, left wrist     Plan: Advance weightbearing as tolerated.  She states she has completed physical therapy gait training.  States she is also working on her disability.  Discussed using scar massage of the left wrist scope about her shaver  Follow-Up Instructions: No follow-ups on file.   Imaging: No results found.  Orders:  No orders of the defined types were placed in this encounter.  No orders of the defined types were placed in this encounter.    PMFS History: Patient Active Problem List   Diagnosis Date Noted  . Abscess of left hand   . Abscess of bursa, left wrist   . S/P PICC central line placement   . Abnormal LFTs   . Cigarette smoker 10/08/2018  . Diabetes mellitus type 2, uncontrolled, with complications ([place]) 33/29/5188  . MRSA bacteremia 10/07/2018  . Charcot foot due to diabetes mellitus ([place])    . Severe protein-calorie malnutrition ([place])   . Leg wound, right, sequela   . Diabetic polyneuropathy associated with type 2 diabetes mellitus ([place])    Past Medical History:  Diagnosis Date  . MRSA infection within last 3 months   . Right foot infection 11/28/2018  . Subacute osteomyelitis, right ankle and foot ([place])   . Type 2 diabetes mellitus treated with insulin ([place])     No family history on file.  Past Surgical History:  Procedure Laterality Date  . AMPUTATION Right 11/30/2018   Procedure: RIGHT BELOW KNEE AMPUTATION;  Surgeon: Newt Minion, MD;  Location: [facility];  Service: Orthopedics;  Laterality: Right;  . APPLICATION OF WOUND VAC Right 11/30/2018   Procedure: Application Of Wound Vac;  Surgeon: Newt Minion, MD;  Location: [facility];  Service: Orthopedics;  Laterality: Right;  . I&D EXTREMITY Left 12/07/2018   Procedure: IRRIGATION AND DEBRIDEMENT LEFT WRIST;  Surgeon: Newt Minion, MD;  Location: [place] [facility];  Service: Orthopedics;  Laterality: Left;  . IRRIGATION AND DEBRIDEMENT KNEE Left   . TEE WITHOUT CARDIOVERSION N/A 12/04/2018   Procedure: TRANSESOPHAGEAL ECHOCARDIOGRAM (TEE);  Surgeon: Dorothy Spark, MD;  Location: [facility];  Service: Cardiovascular;  Laterality: N/A;  Social History   Occupational History  . Not on file  Tobacco Use  . Smoking status: Current Some Day Smoker    Years: 3.00    Types: Cigarettes  . Smokeless tobacco: Never Used  Substance and Sexual Activity  . Alcohol use: Not Currently  . Drug use: Never  . Sexual activity: Not on file

## 2019-04-02 ENCOUNTER — Telehealth: Payer: Self-pay | Admitting: Orthopedic Surgery

## 2019-04-02 NOTE — Telephone Encounter (Signed)
I tried to call patient on both numbers listed to verify where she wanted OT/PT and for what specifically (her wrist or leg)so I could put order in but no answer. Will hold this message and try again.

## 2019-04-02 NOTE — Telephone Encounter (Signed)
Patient called asked if she can get a Rx to continue out patient (PT)? The number to contact patient is [phone]

## 2019-04-03 NOTE — Telephone Encounter (Signed)
Patient called back to return your call.  She would like to go to [facility] in [place], [place] and would like to go for her leg.  Thank you.

## 2019-04-04 ENCOUNTER — Telehealth: Payer: Self-pay | Admitting: Orthopedic Surgery

## 2019-04-04 NOTE — Telephone Encounter (Signed)
Pt called in requesting to speak to someone regarding some problems that's going on with her prescription to out pt therapy, and also about a authorization they're requiring her to get for a gas pedal.    [phone]

## 2019-04-04 NOTE — Telephone Encounter (Signed)
Patient called and stated that [place] [employer] needed clarification on getting her a gas pedal and accessories for her Right leg prosthetic for driving, the contact # is [phone].Will hold this message until we find out what is needed.

## 2019-04-04 NOTE — Telephone Encounter (Signed)
I called [facility] to get their fax# [phone]. Info w/demo and insurance will be faxed.

## 2019-04-08 ENCOUNTER — Telehealth: Payer: Self-pay | Admitting: Orthopedic Surgery

## 2019-04-08 NOTE — Telephone Encounter (Signed)
[employer] called with the patient on the other line in reference to a gas pedal for her foot.  Yolanda [name] requested that our office contact member services.  Their number is [phone].  Thank you.

## 2019-04-10 NOTE — Telephone Encounter (Signed)
Mary reference #[id number] at [employer] was called and she tired to clarify specifically what the patient needed but could not find any information in notes. Mary stated that she will try to discuss this matter with Yolanda to get to the bottom of this so we can take care of the patient's needs. 

## 2019-06-27 ENCOUNTER — Telehealth: Payer: Self-pay | Admitting: Orthopedic Surgery

## 2019-06-27 NOTE — Telephone Encounter (Signed)
Patient called and stated that she need her Disability papers. She came in July and [facility] in [place] is telling her that they sent them to [facility] office.  Please call patient to advise.  [phone]

## 2019-06-27 NOTE — Telephone Encounter (Signed)
Hey, have you seen any disability paperwork for this patient. I do not see anything in the media, thank you. Please advise of message below.

## 2019-06-28 NOTE — Telephone Encounter (Signed)
We received a request from [facility] in [place] in August and records were sent then. Nothing received since then.

## 2019-07-01 NOTE — Telephone Encounter (Signed)
Patient was called and informed, lvm to return call if any other questions.

## 2019-08-22 ENCOUNTER — Telehealth: Payer: Self-pay | Admitting: Orthopedic Surgery

## 2019-08-22 NOTE — Telephone Encounter (Signed)
Regina from [facility] called.   She is wanting to check the status of a frequency change for the patient.   Call back number: [phone] Extension: [phone]

## 2019-08-23 NOTE — Telephone Encounter (Signed)
I called and lm on vm to advise that the pt has not been in the office since 02/27/19 and that we are happy to take care of her needs but she would need follow up in the office so that we could advise appropriately. Advised to have the pt call for an appointment and the office can call back with any questions.

## 2019-09-03 ENCOUNTER — Ambulatory Visit (INDEPENDENT_AMBULATORY_CARE_PROVIDER_SITE_OTHER): Payer: 59 | Admitting: Orthopedic Surgery

## 2019-09-03 ENCOUNTER — Encounter: Payer: Self-pay | Admitting: Orthopedic Surgery

## 2019-09-03 ENCOUNTER — Other Ambulatory Visit: Payer: Self-pay

## 2019-09-03 VITALS — Ht 66.0 in | Wt 148.0 lb

## 2019-09-03 DIAGNOSIS — Z89511 Acquired absence of right leg below knee: Secondary | ICD-10-CM

## 2019-09-03 NOTE — Progress Notes (Signed)
Office Visit Note   Patient: Yolanda [name]           Date of Birth: 1975/09/10           MRN: [id number] Visit Date: 09/03/2019              Requested by: No referring provider defined for this encounter. PCP: System, Pcp Not In  Chief Complaint  Patient presents with  . Right Leg - Follow-up    11/30/18 right BKA       HPI: The patient is a 44 year old woman seen today status post right below-knee amputation in May of last year.  She has completed physical therapy and her second socket.  She had some significant volume loss and did have some adjustments in a new socket made for her prosthetic she is completed gait training and feels that she is doing well she is using a cane today but states she often walks without it and does feel safe.  She is working on getting some hand controls put into her vehicle so she can resume driving she is requesting some supporting documentation.  Assessment & Plan: Visit Diagnoses: No diagnosis found.  Plan: Have provided an order for hand controls for her vehicle. the patient will call or follow-up as needed.  Follow-Up Instructions: No follow-ups on file.   Ortho Exam  Patient is alert, oriented, no adenopathy, well-dressed, normal affect, normal respiratory effort. On examination of the right residual limb this is well-healed well consolidated there is no callus no impending breakdown  Imaging: No results found. No images are attached to the encounter.  Labs: Lab Results  Component Value Date   HGBA1C 7.9 (H) 11/28/2018   HGBA1C 11.0 (H) 10/07/2018   ESRSEDRATE 136 (H) 11/28/2018   CRP 22.1 (H) 11/28/2018   LABURIC 3.2 12/04/2018   REPTSTATUS 12/12/2018 FINAL 12/07/2018   GRAMSTAIN  12/07/2018    RARE WBC PRESENT, PREDOMINANTLY MONONUCLEAR NO ORGANISMS SEEN    CULT  12/07/2018    RARE METHICILLIN RESISTANT STAPHYLOCOCCUS AUREUS NO ANAEROBES ISOLATED Performed at [facility], [address], [place], [place] [address]    LABORGA METHICILLIN RESISTANT STAPHYLOCOCCUS AUREUS 12/07/2018     Lab Results  Component Value Date   ALBUMIN 1.2 (L) 11/29/2018   ALBUMIN 1.4 (L) 11/28/2018   ALBUMIN 2.4 (L) 10/13/2018   LABURIC 3.2 12/04/2018    Lab Results  Component Value Date   MG 1.8 12/19/2018   MG 1.7 12/16/2018   MG 1.7 12/14/2018   No results found for: VD25OH  No results found for: PREALBUMIN CBC EXTENDED Latest Ref Rng & Units 12/19/2018 12/16/2018 12/14/2018  WBC 4.0 - 10.5 K/uL 6.8 7.8 9.0  RBC 3.87 - 5.11 MIL/uL 2.97(L) 2.81(L) 2.74(L)  HGB 12.0 - 15.0 g/dL 9.3(A) 8.9(L) 8.6(L)  HCT 36.0 - 46.0 % 30.0(L) 28.3(L) 27.8(L)  PLT 150 - 400 K/uL 310 294 293  NEUTROABS 1.7 - 7.7 K/uL 3.6 5.4 6.6  LYMPHSABS 0.7 - 4.0 K/uL 2.2 1.7 1.7     Body mass index is 23.89 kg/m.  Orders:  No orders of the defined types were placed in this encounter.  No orders of the defined types were placed in this encounter.    Procedures: No procedures performed  Clinical Data: No additional findings.  ROS:  All other systems negative, except as noted in the HPI. Review of Systems  Constitutional: Negative for chills and fever.  Musculoskeletal: Negative for arthralgias and joint swelling.  Skin: Negative  for wound.    Objective: Vital Signs: Ht 5\' 6"  (1.676 m)   Wt 148 lb (67.1 kg)   BMI 23.89 kg/m   Specialty Comments:  No specialty comments available.  PMFS History: Patient Active Problem List   Diagnosis Date Noted  . Abscess of left hand   . Abscess of bursa, left wrist   . S/P PICC central line placement   . Abnormal LFTs   . Cigarette smoker 10/08/2018  . Diabetes mellitus type 2, uncontrolled, with complications ([place]) 66/12/3014  . MRSA bacteremia 10/07/2018  . Charcot foot due to diabetes mellitus ([place])   . Severe protein-calorie malnutrition ([place])   . Leg wound, right, sequela   . Diabetic polyneuropathy associated with type 2 diabetes mellitus ([place])    Past Medical History:    Diagnosis Date  . MRSA infection within last 3 months   . Right foot infection 11/28/2018  . Subacute osteomyelitis, right ankle and foot ([place])   . Type 2 diabetes mellitus treated with insulin ([place])     No family history on file.  Past Surgical History:  Procedure Laterality Date  . AMPUTATION Right 11/30/2018   Procedure: RIGHT BELOW KNEE AMPUTATION;  Surgeon: Newt Minion, MD;  Location: [facility];  Service: Orthopedics;  Laterality: Right;  . APPLICATION OF WOUND VAC Right 11/30/2018   Procedure: Application Of Wound Vac;  Surgeon: Newt Minion, MD;  Location: [place];  Service: Orthopedics;  Laterality: Right;  . I & D EXTREMITY Left 12/07/2018   Procedure: IRRIGATION AND DEBRIDEMENT LEFT WRIST;  Surgeon: Newt Minion, MD;  Location: [facility];  Service: Orthopedics;  Laterality: Left;  . IRRIGATION AND DEBRIDEMENT KNEE Left   . TEE WITHOUT CARDIOVERSION N/A 12/04/2018   Procedure: TRANSESOPHAGEAL ECHOCARDIOGRAM (TEE);  Surgeon: Dorothy Spark, MD;  Location: [facility] ENDOSCOPY;  Service: Cardiovascular;  Laterality: N/A;   Social History   Occupational History  . Not on file  Tobacco Use  . Smoking status: Current Some Day Smoker    Years: 3.00    Types: Cigarettes  . Smokeless tobacco: Never Used  Substance and Sexual Activity  . Alcohol use: Not Currently  . Drug use: Never  . Sexual activity: Not on file

## 2019-11-08 IMAGING — MR MRI OF THE LEFT WRIST WITHOUT CONTRAST
6 series · 40 of 40 positions shown · non-contrast
Comparison: None.

CLINICAL DATA: Chronic left wrist swelling. Patient's radiographs
shows no destructive bony changes, subluxation, or fractures. White
cell count 22,000 uric acid within normal range. I

EXAM:
MR OF THE LEFT WRIST WITHOUT CONTRAST
TECHNIQUE: Multiplanar, multisequence MR imaging of the left wrist was
performed. No intravenous contrast was administered.

[Series 3: T2 fat-sat · axial · left · 2.0mm · 0.48mm/px · z∈[-92,-21]mm · 7 of 35 slices shown (1 of 2)]
[im 1/35]
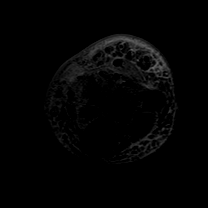
[im 6/35]
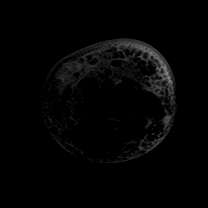
[im 12/35]
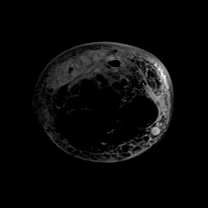
[im 18/35]
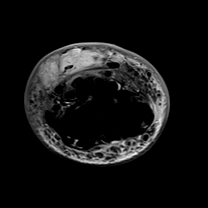
[im 23/35]
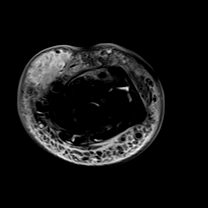
[im 29/35]
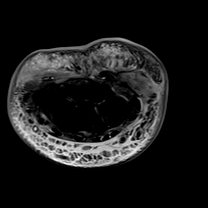
[im 35/35]
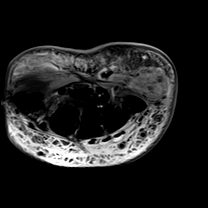

[Series 4: T1 · axial · left · 2.0mm · 0.33mm/px · z∈[-91,-20]mm · 8 of 35 slices shown (1 of 2)]
[im 1/35]
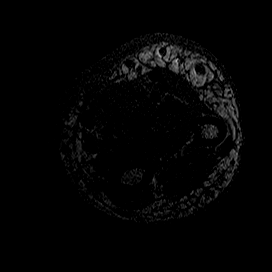
[im 5/35]
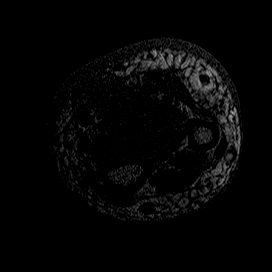
[im 10/35]
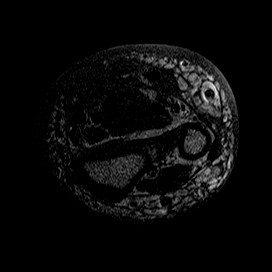
[im 15/35]
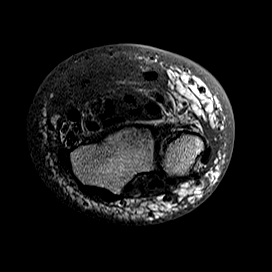
[im 20/35]
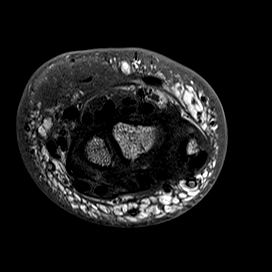
[im 25/35]
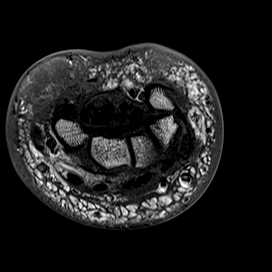
[im 30/35]
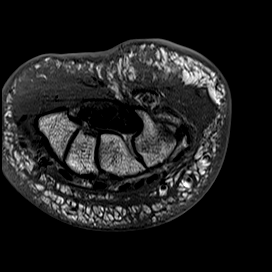
[im 35/35]
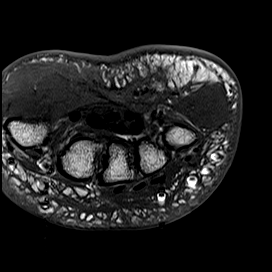

[Series 5: T1 · coronal · left · 2.0mm · 0.31mm/px · 7 of 30 slices shown (2 of 2)]
[im 1/30]
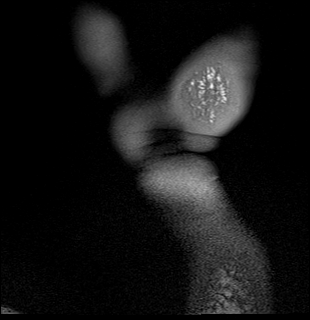
[im 5/30]
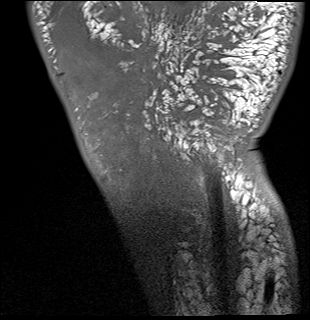
[im 10/30]
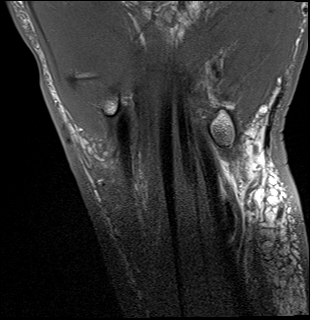
[im 15/30]
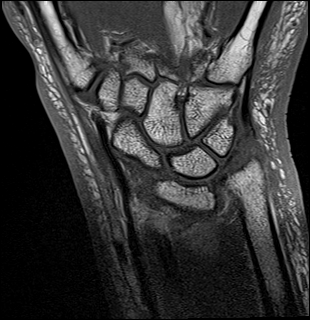
[im 20/30]
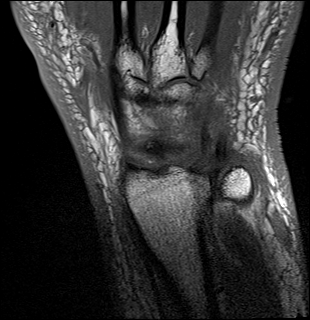
[im 25/30]
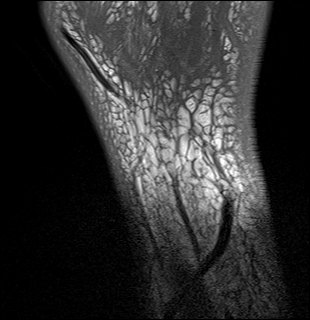
[im 30/30]
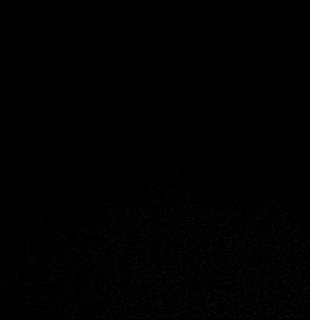

[Series 6: T2 fat-sat · coronal · left · 2.0mm · 0.48mm/px · 6 of 29 slices shown (2 of 2)]
[im 1/29]
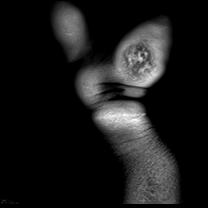
[im 6/29]
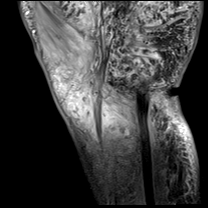
[im 12/29]
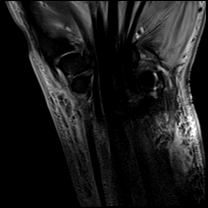
[im 17/29]
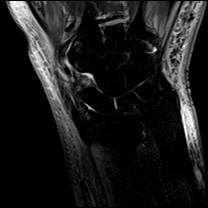
[im 23/29]
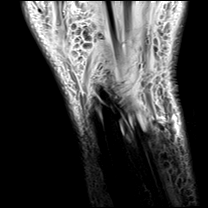
[im 29/29]
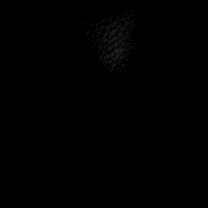

[Series 7: PD fat-sat · sagittal · left · 2.5mm · 0.33mm/px · 5 of 25 slices shown (1 of 2)]
[im 1/25]
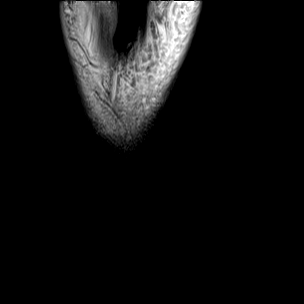
[im 7/25]
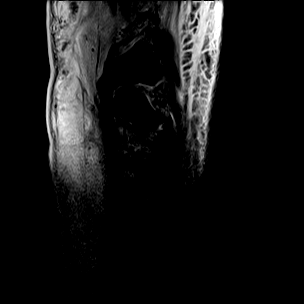
[im 13/25]
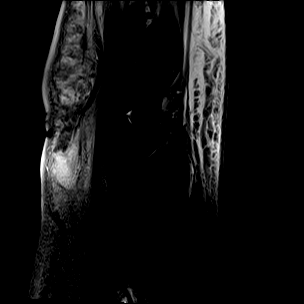
[im 19/25]
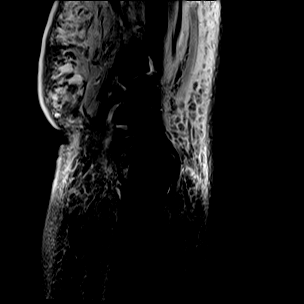
[im 25/25]
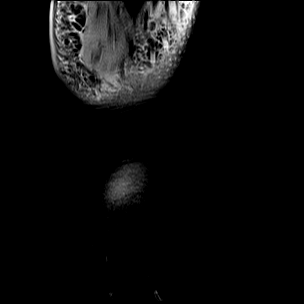

[Series 8: PD fat-sat · coronal · left · 2.0mm · 0.33mm/px · 7 of 30 slices shown (2 of 2)]
[im 1/30]
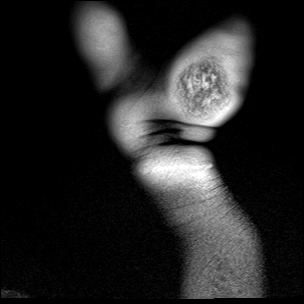
[im 5/30]
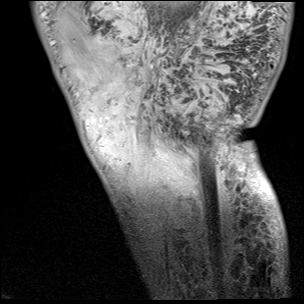
[im 10/30]
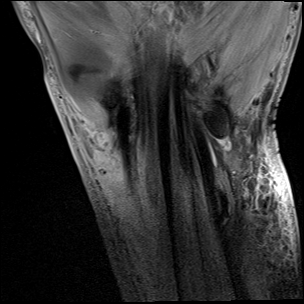
[im 15/30]
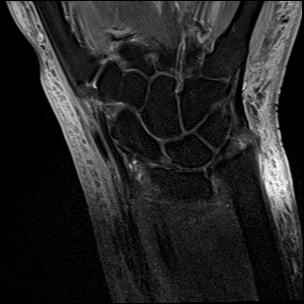
[im 20/30]
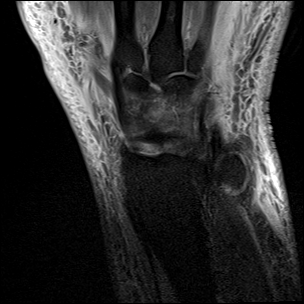
[im 25/30]
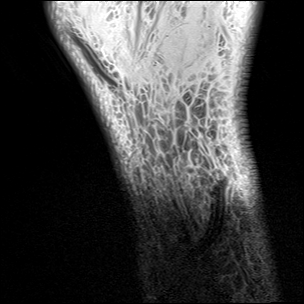
[im 30/30]
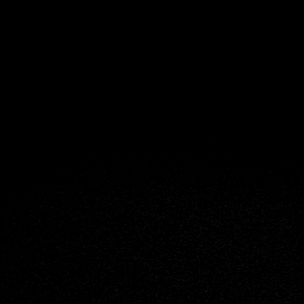

[40 of 40 positions shown; findings below may reference images not displayed]

FINDINGS: Ligaments: Intact scapholunate and lunotriquetral ligaments.

Triangular fibrocartilage: Intact TFCC.

Tendons: Intact flexor and extensor compartment tendons.

Carpal tunnel/median nerve: Normal carpal tunnel. Normal median
nerve.

Guyon's canal: Normal.

Joint/cartilage: No joint effusion. No synovitis. No intra-articular
loose body. Small amount of joint fluid in the pisotriquetral joint.
No chondral defect.

Bones/carpal alignment: No acute osseous abnormality. No aggressive
osseous lesion. No periosteal reaction or bone destruction. Normal
alignment.

Other: Soft tissue edema circumferentially around the wrist most
concerning for cellulitis. Heterogeneous T2 hyperintense and T1
hypointense area in the volar subcutaneous fat measuring
approximately 2.6 x 1.3 x 3 cm which may reflect phlegmonous changes
versus a developing abscess. Muscle edema in the opponens pollicis
muscle, adductor digiti minimi, and flexor digiti minimi muscles
concerning for myositis secondary to an infectious or inflammatory
etiology.
IMPRESSION: 1. Cellulitis of left wrist.
2. Heterogeneous focal abnormality in the volar subcutaneous fat
measuring 2.6 x 1.3 x 3 cm which may reflect a phlegmon versus
developing abscess. If there is further clinical concern recommend
MRI of the left wrist with contrast.
3. Muscle edema in the opponens pollicis muscle, adductor digiti
minimi, and flexor digiti minimi muscles concerning for myositis
secondary to an infectious or inflammatory etiology.
4. No evidence of septic arthritis of the left wrist.

## 2020-04-30 ENCOUNTER — Ambulatory Visit (INDEPENDENT_AMBULATORY_CARE_PROVIDER_SITE_OTHER): Payer: 59 | Admitting: Orthopedic Surgery

## 2020-04-30 ENCOUNTER — Encounter: Payer: Self-pay | Admitting: Orthopedic Surgery

## 2020-04-30 ENCOUNTER — Ambulatory Visit: Payer: Self-pay

## 2020-04-30 DIAGNOSIS — M25561 Pain in right knee: Secondary | ICD-10-CM | POA: Diagnosis not present

## 2020-04-30 DIAGNOSIS — S88119A Complete traumatic amputation at level between knee and ankle, unspecified lower leg, initial encounter: Secondary | ICD-10-CM

## 2020-04-30 NOTE — Progress Notes (Signed)
Office Visit Note   Patient: Yolanda [name]           Date of Birth: 07-05-1976           MRN: [id number] Visit Date: 04/30/2020              Requested by: No referring provider defined for this encounter. PCP: Pcp, No  Chief Complaint  Patient presents with   Right Knee - Pain      HPI: Patient is a 44 year old woman with a right transtibial amputation who has been having increasing callus and pain over the inferior pole of her patella.  She states the pain radiates from the patella proximally.  Assessment & Plan: Visit Diagnoses:  1. Acute pain of right knee   2. Below knee amputation [facility])     Plan: Patient is given a prescription for [employer] to evaluate for new socket or for having to decrease subsidence in her socket.  Follow-Up Instructions: Return in about 4 weeks (around 05/28/2020).   Ortho Exam  Patient is alert, oriented, no adenopathy, well-dressed, normal affect, normal respiratory effort. Examination patient has significant atrophy of her thigh and leg.  She has callus of the inferior pole of the patella from subsidence.  There is no open ulcers she has pain to palpation over the inferior pole the patella and this reproduces her symptoms.  No open ulcers.  Imaging: XR Knee 1-2 Views Right  Result Date: 04/30/2020 2 view radiographs of the right knee shows a congruent joint no bony spurs no fractures no destructive bony changes over the transtibial amputation.  No images are attached to the encounter.  Labs: Lab Results  Component Value Date   HGBA1C 7.9 (H) 11/28/2018   HGBA1C 11.0 (H) 10/07/2018   ESRSEDRATE 136 (H) 11/28/2018   CRP 22.1 (H) 11/28/2018   LABURIC 3.2 12/04/2018   REPTSTATUS 12/12/2018 FINAL 12/07/2018   GRAMSTAIN  12/07/2018    RARE WBC PRESENT, PREDOMINANTLY MONONUCLEAR NO ORGANISMS SEEN    CULT  12/07/2018    RARE METHICILLIN RESISTANT STAPHYLOCOCCUS AUREUS NO ANAEROBES ISOLATED Performed at [facility], [address], [place], [place] [address]    LABORGA METHICILLIN RESISTANT STAPHYLOCOCCUS AUREUS 12/07/2018     Lab Results  Component Value Date   ALBUMIN 1.2 (L) 11/29/2018   ALBUMIN 1.4 (L) 11/28/2018   ALBUMIN 2.4 (L) 10/13/2018   LABURIC 3.2 12/04/2018    Lab Results  Component Value Date   MG 1.8 12/19/2018   MG 1.7 12/16/2018   MG 1.7 12/14/2018   No results found for: VD25OH  No results found for: PREALBUMIN CBC EXTENDED Latest Ref Rng & Units 12/19/2018 12/16/2018 12/14/2018  WBC 4.0 - 10.5 K/uL 6.8 7.8 9.0  RBC 3.87 - 5.11 MIL/uL 2.97(L) 2.81(L) 2.74(L)  HGB 12.0 - 15.0 g/dL 9.2(J) 8.9(L) 8.6(L)  HCT 36 - 46 % 30.0(L) 28.3(L) 27.8(L)  PLT 150 - 400 K/uL 310 294 293  NEUTROABS 1.7 - 7.7 K/uL 3.6 5.4 6.6  LYMPHSABS 0.7 - 4.0 K/uL 2.2 1.7 1.7     There is no height or weight on file to calculate BMI.  Orders:  Orders Placed This Encounter  Procedures   XR Knee 1-2 Views Right   No orders of the defined types were placed in this encounter.    Procedures: No procedures performed  Clinical Data: No additional findings.  ROS:  All other systems negative, except as noted in the HPI. Review of Systems  Objective: Vital  Signs: There were no vitals taken for this visit.  Specialty Comments:  No specialty comments available.  PMFS History: Patient Active Problem List   Diagnosis Date Noted   Abscess of left hand    Abscess of bursa, left wrist    S/P PICC central line placement    Abnormal LFTs    Cigarette smoker 10/08/2018   Diabetes mellitus type 2, uncontrolled, with complications ([facility]) 10/07/2018   MRSA bacteremia 10/07/2018   Charcot foot due to diabetes mellitus ([facility])    Severe protein-calorie malnutrition ([facility])    Leg wound, right, sequela    Diabetic polyneuropathy associated with type 2 diabetes mellitus ([facility])    Past Medical History:  Diagnosis Date   MRSA infection within last 3 months    Right foot infection 11/28/2018   Subacute  osteomyelitis, right ankle and foot ([facility])    Type 2 diabetes mellitus treated with insulin ([facility])     History reviewed. No pertinent family history.  Past Surgical History:  Procedure Laterality Date   AMPUTATION Right 11/30/2018   Procedure: RIGHT BELOW KNEE AMPUTATION;  Surgeon: Nadara Mustard, MD;  Location: [facility] OR;  Service: Orthopedics;  Laterality: Right;   APPLICATION OF WOUND VAC Right 11/30/2018   Procedure: Application Of Wound Vac;  Surgeon: Nadara Mustard, MD;  Location: [facility] OR;  Service: Orthopedics;  Laterality: Right;   I & D EXTREMITY Left 12/07/2018   Procedure: IRRIGATION AND DEBRIDEMENT LEFT WRIST;  Surgeon: Nadara Mustard, MD;  Location: [facility] OR;  Service: Orthopedics;  Laterality: Left;   IRRIGATION AND DEBRIDEMENT KNEE Left    TEE WITHOUT CARDIOVERSION N/A 12/04/2018   Procedure: TRANSESOPHAGEAL ECHOCARDIOGRAM (TEE);  Surgeon: Lars Masson, MD;  Location: [facility] ENDOSCOPY;  Service: Cardiovascular;  Laterality: N/A;   Social History   Occupational History   Not on file  Tobacco Use   Smoking status: Current Some Day Smoker    Years: 3.00    Types: Cigarettes   Smokeless tobacco: Never Used  Substance and Sexual Activity   Alcohol use: Not Currently   Drug use: Never   Sexual activity: Not on file

## 2021-03-23 ENCOUNTER — Ambulatory Visit: Payer: 59 | Admitting: Orthopedic Surgery

## 2021-05-11 DEATH — deceased
